# Patient Record
Sex: Female | Born: 1950
Health system: Southern US, Community
[De-identification: ages and names within clinical notes are randomized; demographics above are authoritative.]

## PROBLEM LIST (undated history)

## (undated) DIAGNOSIS — R112 Nausea with vomiting, unspecified: Secondary | ICD-10-CM

## (undated) DIAGNOSIS — L309 Dermatitis, unspecified: Secondary | ICD-10-CM

## (undated) DIAGNOSIS — Z9889 Other specified postprocedural states: Secondary | ICD-10-CM

## (undated) DIAGNOSIS — R32 Unspecified urinary incontinence: Secondary | ICD-10-CM

## (undated) DIAGNOSIS — K635 Polyp of colon: Secondary | ICD-10-CM

## (undated) HISTORY — PX: ADENOIDECTOMY: SUR15

## (undated) HISTORY — DX: Unspecified urinary incontinence: R32

## (undated) HISTORY — PX: ABDOMINAL HYSTERECTOMY: SHX81

## (undated) HISTORY — DX: Dermatitis, unspecified: L30.9

## (undated) HISTORY — DX: Polyp of colon: K63.5

---

## 2004-12-29 ENCOUNTER — Ambulatory Visit: Payer: Self-pay | Admitting: Unknown Physician Specialty

## 2006-02-18 ENCOUNTER — Ambulatory Visit: Payer: Self-pay | Admitting: Unknown Physician Specialty

## 2007-03-17 ENCOUNTER — Ambulatory Visit: Payer: Self-pay | Admitting: Unknown Physician Specialty

## 2008-05-08 ENCOUNTER — Ambulatory Visit: Payer: Self-pay | Admitting: Unknown Physician Specialty

## 2008-05-21 ENCOUNTER — Ambulatory Visit: Payer: Self-pay | Admitting: Unknown Physician Specialty

## 2008-06-28 ENCOUNTER — Ambulatory Visit: Payer: Self-pay | Admitting: General Surgery

## 2008-06-28 HISTORY — PX: COLONOSCOPY: SHX174

## 2009-05-27 ENCOUNTER — Ambulatory Visit: Payer: Self-pay | Admitting: Unknown Physician Specialty

## 2010-06-18 ENCOUNTER — Ambulatory Visit: Payer: Self-pay | Admitting: Unknown Physician Specialty

## 2011-07-17 ENCOUNTER — Ambulatory Visit: Payer: Self-pay | Admitting: Unknown Physician Specialty

## 2014-11-07 ENCOUNTER — Ambulatory Visit: Payer: Self-pay | Admitting: Unknown Physician Specialty

## 2016-09-15 ENCOUNTER — Ambulatory Visit (INDEPENDENT_AMBULATORY_CARE_PROVIDER_SITE_OTHER): Payer: Medicare Other | Admitting: Internal Medicine

## 2016-09-15 ENCOUNTER — Encounter: Payer: Self-pay | Admitting: Internal Medicine

## 2016-09-15 DIAGNOSIS — M810 Age-related osteoporosis without current pathological fracture: Secondary | ICD-10-CM | POA: Diagnosis not present

## 2016-09-15 DIAGNOSIS — Z86010 Personal history of colonic polyps: Secondary | ICD-10-CM | POA: Diagnosis not present

## 2016-09-15 DIAGNOSIS — Z8742 Personal history of other diseases of the female genital tract: Secondary | ICD-10-CM

## 2016-09-15 DIAGNOSIS — Z87898 Personal history of other specified conditions: Secondary | ICD-10-CM

## 2016-09-15 NOTE — Progress Notes (Signed)
Pre visit review using our clinic review tool, if applicable. No additional management support is needed unless otherwise documented below in the visit note. 

## 2016-09-15 NOTE — Progress Notes (Signed)
Patient ID: Toni Gardner, female   DOB: January 17, 1951, 65 y.o.   MRN: [id number]   Subjective:    Patient ID: Toni Gardner, female    DOB: September 25, 1951, 65 y.o.   MRN: [id number]  HPI  Patient here to establish care.  Has previously been seeing Dr Vernie Ammons and Dr Gaylan Gerold.  She has a history of osteoporosis.  Takes vitamin D.  Previously on fosamax.  She is s/p hysterectomy with BSO.  Had a history of abnormal pap smear.  Since her surgery, all normal paps.  Had bladder surgery 15 years ago.  Was not emptying her bladder.  This has improved.  She tries to stay active.  No chest pain.  No sob.  No acid reflux.  No abdominal pain or cramping.  Bowels stable.  Had colonoscopy in her early 48s.  Had another colonoscopy approximately 58.  No polyps.  Performed by Dr Bary Castilla.  Recommended f/u in 10 years.  Was seen at [facility] for her gyn care.  Two years ago had left hip pain.  Saw ortho.  Does exercise.  Is better.  Overall she feels she is doing well.     Past Medical History:  Diagnosis Date  . Colon polyp   . Urine incontinence    Past Surgical History:  Procedure Laterality Date  . ABDOMINAL HYSTERECTOMY    . ADENOIDECTOMY     Family History  Problem Relation Age of Onset  . Heart disease Father   . Cancer Maternal Aunt     breast  . Cancer Maternal Uncle     colon   Social History   Social History  . Marital status: Married    Spouse name: N/A  . Number of children: N/A  . Years of education: N/A   Social History Main Topics  . Smoking status: Never Smoker  . Smokeless tobacco: Never Used  . Alcohol use Yes  . Drug use: Unknown  . Sexual activity: Not Asked   Other Topics Concern  . None   Social History Narrative  . None    Outpatient Encounter Prescriptions as of 09/15/2016  Medication Sig  . ALPRAZolam (XANAX) 0.25 MG tablet   . cholecalciferol (VITAMIN D) 1000 units tablet Take 1,000 Units by mouth daily.   No facility-administered encounter medications on file as  of 09/15/2016.     Review of Systems  Constitutional: Negative for appetite change and unexpected weight change.  HENT: Negative for congestion and sinus pressure.   Respiratory: Negative for cough, chest tightness and shortness of breath.   Cardiovascular: Negative for chest pain, palpitations and leg swelling.  Gastrointestinal: Negative for abdominal pain, diarrhea, nausea and vomiting.  Genitourinary: Negative for difficulty urinating and dysuria.  Musculoskeletal: Negative for joint swelling.       Left hip pain better.  Does exercise.    Skin: Negative for color change and rash.  Neurological: Negative for dizziness, light-headedness and headaches.  Psychiatric/Behavioral: Negative for agitation and dysphoric mood.       Objective:    Physical Exam  Constitutional: She appears well-developed and well-nourished. No distress.  HENT:  Nose: Nose normal.  Mouth/Throat: Oropharynx is clear and moist.  Neck: Neck supple. No thyromegaly present.  Cardiovascular: Normal rate and regular rhythm.   Pulmonary/Chest: Breath sounds normal. No respiratory distress. She has no wheezes.  Abdominal: Soft. Bowel sounds are normal. There is no tenderness.  Musculoskeletal: She exhibits no edema or tenderness.  Lymphadenopathy:    She has no  cervical adenopathy.  Skin: No rash noted. No erythema.  Psychiatric: She has a normal mood and affect. Her behavior is normal.    BP 122/70   Pulse 91   Temp 98.1 F (36.7 C) (Oral)   Ht 4\' 11"  (1.499 m)   Wt 106 lb 3.2 oz (48.2 kg)   SpO2 96%   BMI 21.45 kg/m  Wt Readings from Last 3 Encounters:  09/15/16 106 lb 3.2 oz (48.2 kg)       Assessment & Plan:   Problem List Items Addressed This Visit    History of abnormal cervical Pap smear    Previously followed at [facility].  S/p hysterectomy.  All normal paps since.  Follow.       History of colonic polyps    Had last colonoscopy approximately age 82.  Performed by Dr Bary Castilla.  No  polyps.  Recommended f/u colonoscopy in 10 years.        Osteoporosis    Previously on fosamax.  Off now.  Continue vitamin D supplements.  Follow.        Relevant Medications   cholecalciferol (VITAMIN D) 1000 units tablet    Other Visit Diagnoses   None.      Einar Pheasant, MD

## 2016-09-20 ENCOUNTER — Encounter: Payer: Self-pay | Admitting: Internal Medicine

## 2016-09-20 DIAGNOSIS — M810 Age-related osteoporosis without current pathological fracture: Secondary | ICD-10-CM | POA: Insufficient documentation

## 2016-09-20 DIAGNOSIS — Z86010 Personal history of colonic polyps: Secondary | ICD-10-CM | POA: Insufficient documentation

## 2016-09-20 DIAGNOSIS — Z8742 Personal history of other diseases of the female genital tract: Secondary | ICD-10-CM | POA: Insufficient documentation

## 2016-09-20 NOTE — Assessment & Plan Note (Signed)
Previously followed at [facility].  S/p hysterectomy.  All normal paps since.  Follow.

## 2016-09-20 NOTE — Assessment & Plan Note (Signed)
Previously on fosamax.  Off now.  Continue vitamin D supplements.  Follow.

## 2016-09-20 NOTE — Assessment & Plan Note (Signed)
Had last colonoscopy approximately age 65.  Performed by Dr Bary Castilla.  No polyps.  Recommended f/u colonoscopy in 10 years.

## 2016-12-11 ENCOUNTER — Ambulatory Visit (INDEPENDENT_AMBULATORY_CARE_PROVIDER_SITE_OTHER): Payer: Medicare Other | Admitting: Internal Medicine

## 2016-12-11 ENCOUNTER — Encounter: Payer: Self-pay | Admitting: Internal Medicine

## 2016-12-11 VITALS — BP 116/62 | HR 94 | Temp 98.1°F | Ht 59.0 in | Wt 106.6 lb

## 2016-12-11 DIAGNOSIS — Z86010 Personal history of colonic polyps: Secondary | ICD-10-CM | POA: Diagnosis not present

## 2016-12-11 DIAGNOSIS — Z1211 Encounter for screening for malignant neoplasm of colon: Secondary | ICD-10-CM | POA: Insufficient documentation

## 2016-12-11 DIAGNOSIS — M810 Age-related osteoporosis without current pathological fracture: Secondary | ICD-10-CM | POA: Diagnosis not present

## 2016-12-11 DIAGNOSIS — Z87898 Personal history of other specified conditions: Secondary | ICD-10-CM

## 2016-12-11 DIAGNOSIS — Z1239 Encounter for other screening for malignant neoplasm of breast: Secondary | ICD-10-CM

## 2016-12-11 DIAGNOSIS — Z8742 Personal history of other diseases of the female genital tract: Secondary | ICD-10-CM

## 2016-12-11 DIAGNOSIS — Z1231 Encounter for screening mammogram for malignant neoplasm of breast: Secondary | ICD-10-CM

## 2016-12-11 DIAGNOSIS — Z13220 Encounter for screening for lipoid disorders: Secondary | ICD-10-CM

## 2016-12-11 DIAGNOSIS — Z0000 Encounter for general adult medical examination without abnormal findings: Secondary | ICD-10-CM | POA: Diagnosis not present

## 2016-12-11 MED ORDER — ALPRAZOLAM 0.25 MG PO TABS: ORAL_TABLET | ORAL | 1 refills | Status: DC

## 2016-12-11 NOTE — Progress Notes (Signed)
Pre visit review using our clinic review tool, if applicable. No additional management support is needed unless otherwise documented below in the visit note. 

## 2016-12-11 NOTE — Assessment & Plan Note (Addendum)
Physical today 12/11/16.  PAP 10/22/15 negative.  States had colonoscopy age 66.  Recommended f/u in 10 years per pt.  Schedule mammogram.

## 2016-12-11 NOTE — Progress Notes (Signed)
Patient ID: Toni Gardner, female   DOB: 1951-10-29, 66 y.o.   MRN: [id number]   Subjective:    Patient ID: Toni Gardner, female    DOB: May 29, 1951, 66 y.o.   MRN: [id number]  HPI  Patient here for her physical exam.  She is doing well.  States active.  No chest pain.  No sob.  Was being followed at [facility].  PAP 10/22/15 - negative.  Due mammogram.  No abdominal pain or cramping.  Bowels stable.     Past Medical History:  Diagnosis Date  . Colon polyp   . Urine incontinence    Past Surgical History:  Procedure Laterality Date  . ABDOMINAL HYSTERECTOMY    . ADENOIDECTOMY     Family History  Problem Relation Age of Onset  . Heart disease Father   . Cancer Maternal Aunt     breast  . Cancer Maternal Uncle     colon   Social History   Social History  . Marital status: Married    Spouse name: N/A  . Number of children: N/A  . Years of education: N/A   Social History Main Topics  . Smoking status: Never Smoker  . Smokeless tobacco: Never Used  . Alcohol use Yes  . Drug use: Unknown  . Sexual activity: Not Asked   Other Topics Concern  . None   Social History Narrative  . None    Outpatient Encounter Prescriptions as of 12/11/2016  Medication Sig  . ALPRAZolam (XANAX) 0.25 MG tablet 1/2 tablet q day prn  . cholecalciferol (VITAMIN D) 1000 units tablet Take 1,000 Units by mouth daily.  . [DISCONTINUED] ALPRAZolam (XANAX) 0.25 MG tablet    No facility-administered encounter medications on file as of 12/11/2016.     Review of Systems  Constitutional: Negative for appetite change and unexpected weight change.  HENT: Negative for congestion and sinus pressure.   Eyes: Negative for pain and visual disturbance.  Respiratory: Negative for cough, chest tightness and shortness of breath.   Cardiovascular: Negative for chest pain, palpitations and leg swelling.  Gastrointestinal: Negative for abdominal pain, diarrhea, nausea and vomiting.  Genitourinary: Negative for  difficulty urinating and dysuria.  Musculoskeletal: Negative for back pain and joint swelling.  Skin: Negative for color change and rash.  Neurological: Negative for dizziness, light-headedness and headaches.  Hematological: Negative for adenopathy. Does not bruise/bleed easily.  Psychiatric/Behavioral: Negative for agitation and dysphoric mood.       Objective:    Physical Exam  Constitutional: She is oriented to person, place, and time. She appears well-developed and well-nourished. No distress.  HENT:  Nose: Nose normal.  Mouth/Throat: Oropharynx is clear and moist.  Eyes: Right eye exhibits no discharge. Left eye exhibits no discharge. No scleral icterus.  Neck: Neck supple. No thyromegaly present.  Cardiovascular: Normal rate and regular rhythm.   Pulmonary/Chest: Breath sounds normal. No accessory muscle usage. No tachypnea. No respiratory distress. She has no decreased breath sounds. She has no wheezes. She has no rhonchi. Right breast exhibits no inverted nipple, no mass, no nipple discharge and no tenderness (no axillary adenopathy). Left breast exhibits no inverted nipple, no mass, no nipple discharge and no tenderness (no axilarry adenopathy).  Abdominal: Soft. Bowel sounds are normal. There is no tenderness.  Musculoskeletal: She exhibits no edema or tenderness.  Lymphadenopathy:    She has no cervical adenopathy.  Neurological: She is alert and oriented to person, place, and time.  Skin: Skin is warm. No rash  noted. No erythema.  Psychiatric: She has a normal mood and affect. Her behavior is normal.    BP 116/62   Pulse 94   Temp 98.1 F (36.7 C) (Oral)   Ht 4\' 11"  (1.499 m)   Wt 106 lb 9.6 oz (48.4 kg)   SpO2 94%   BMI 21.53 kg/m  Wt Readings from Last 3 Encounters:  12/11/16 106 lb 9.6 oz (48.4 kg)  09/15/16 106 lb 3.2 oz (48.2 kg)       Assessment & Plan:   Problem List Items Addressed This Visit    Health care maintenance    Physical today 12/11/16.  PAP  10/22/15 negative.  States had colonoscopy age 1.  Recommended f/u in 10 years per pt.  Schedule mammogram.       History of abnormal cervical Pap smear    PAP - 10/22/15 - negative.  Has been followed by [facility].        History of colonic polyps    Had colonoscopy approximately age 35.  Performed by Dr Bary Castilla.  No polyps.  States recommended f/u colonoscopy - 10 years.        Osteoporosis    Previously on fosamax.  Discussed f/u bone density.  She is agreeable, but does not want to schedule at this time.  Taking vitamin D supplements.  Recheck vitamin D level.        Relevant Orders   CBC with Differential/Platelet   Comprehensive metabolic panel   TSH   VITAMIN D 25 Hydroxy (Vit-D Deficiency, Fractures)    Other Visit Diagnoses    Routine general medical examination at a health care facility    -  Primary   Breast cancer screening       Relevant Orders   MM DIGITAL SCREENING BILATERAL   Screening cholesterol level       Relevant Orders   Lipid panel       Einar Pheasant, MD

## 2016-12-13 ENCOUNTER — Encounter: Payer: Self-pay | Admitting: Internal Medicine

## 2016-12-13 NOTE — Assessment & Plan Note (Signed)
Had colonoscopy approximately age 66.  Performed by Dr Bary Castilla.  No polyps.  States recommended f/u colonoscopy - 10 years.

## 2016-12-13 NOTE — Assessment & Plan Note (Signed)
Previously on fosamax.  Discussed f/u bone density.  She is agreeable, but does not want to schedule at this time.  Taking vitamin D supplements.  Recheck vitamin D level.

## 2016-12-13 NOTE — Assessment & Plan Note (Signed)
PAP - 10/22/15 - negative.  Has been followed by [facility].

## 2017-01-01 ENCOUNTER — Other Ambulatory Visit (INDEPENDENT_AMBULATORY_CARE_PROVIDER_SITE_OTHER): Payer: Medicare Other

## 2017-01-01 DIAGNOSIS — Z13220 Encounter for screening for lipoid disorders: Secondary | ICD-10-CM | POA: Diagnosis not present

## 2017-01-01 DIAGNOSIS — M810 Age-related osteoporosis without current pathological fracture: Secondary | ICD-10-CM

## 2017-01-01 LAB — COMPREHENSIVE METABOLIC PANEL
ALT: 20 U/L (ref 0–35)
AST: 16 U/L (ref 0–37)
Albumin: 4.3 g/dL (ref 3.5–5.2)
Alkaline Phosphatase: 71 U/L (ref 39–117)
BUN: 19 mg/dL (ref 6–23)
CHLORIDE: 106 meq/L (ref 96–112)
CO2: 31 meq/L (ref 19–32)
CREATININE: 0.73 mg/dL (ref 0.40–1.20)
Calcium: 9.7 mg/dL (ref 8.4–10.5)
GFR: 84.43 mL/min (ref >60.00)
Glucose, Bld: 95 mg/dL (ref 70–99)
Potassium: 4.7 mEq/L (ref 3.5–5.1)
SODIUM: 140 meq/L (ref 135–145)
Total Bilirubin: 0.4 mg/dL (ref 0.2–1.2)
Total Protein: 6.9 g/dL (ref 6.0–8.3)

## 2017-01-01 LAB — CBC WITH DIFFERENTIAL/PLATELET
BASOS PCT: 0.4 % (ref 0.0–3.0)
Basophils Absolute: 0 10*3/uL (ref 0.0–0.1)
EOS ABS: 0.2 10*3/uL (ref 0.0–0.7)
EOS PCT: 3 % (ref 0.0–5.0)
HCT: 42.7 % (ref 36.0–46.0)
HEMOGLOBIN: 14.5 g/dL (ref 12.0–15.0)
LYMPHS PCT: 30.6 % (ref 12.0–46.0)
Lymphs Abs: 2.5 10*3/uL (ref 0.7–4.0)
MCHC: 33.9 g/dL (ref 30.0–36.0)
MCV: 92.5 fl (ref 78.0–100.0)
Monocytes Absolute: 0.6 10*3/uL (ref 0.1–1.0)
Monocytes Relative: 7.3 % (ref 3.0–12.0)
Neutro Abs: 4.8 10*3/uL (ref 1.4–7.7)
Neutrophils Relative %: 58.7 % (ref 43.0–77.0)
Platelets: 214 10*3/uL (ref 150.0–400.0)
RBC: 4.62 Mil/uL (ref 3.87–5.11)
RDW: 14.3 % (ref 11.5–15.5)
WBC: 8.2 10*3/uL (ref 4.0–10.5)

## 2017-01-01 LAB — LIPID PANEL
CHOL/HDL RATIO: 2
Cholesterol: 198 mg/dL (ref 0–200)
HDL: 84.3 mg/dL (ref >39.00)
LDL CALC: 99 mg/dL (ref 0–99)
NonHDL: 113.32
TRIGLYCERIDES: 71 mg/dL (ref 0.0–149.0)
VLDL: 14.2 mg/dL (ref 0.0–40.0)

## 2017-01-01 LAB — VITAMIN D 25 HYDROXY (VIT D DEFICIENCY, FRACTURES): VITD: 32.68 ng/mL (ref 30.00–100.00)

## 2017-01-01 LAB — TSH: TSH: 1.38 u[IU]/mL (ref 0.35–4.50)

## 2017-01-04 ENCOUNTER — Encounter: Payer: Self-pay | Admitting: Internal Medicine

## 2017-01-08 ENCOUNTER — Ambulatory Visit
Admission: RE | Admit: 2017-01-08 | Discharge: 2017-01-08 | Disposition: A | Discharge status: Home / Self Care | Payer: Medicare Other | Source: Ambulatory Visit | Attending: Internal Medicine | Admitting: Internal Medicine

## 2017-01-08 DIAGNOSIS — Z1231 Encounter for screening mammogram for malignant neoplasm of breast: Secondary | ICD-10-CM | POA: Insufficient documentation

## 2017-01-08 DIAGNOSIS — Z1239 Encounter for other screening for malignant neoplasm of breast: Secondary | ICD-10-CM

## 2017-06-17 ENCOUNTER — Ambulatory Visit: Payer: Medicare Other | Admitting: Internal Medicine

## 2017-06-24 ENCOUNTER — Ambulatory Visit: Payer: Medicare Other | Admitting: Internal Medicine

## 2017-08-05 ENCOUNTER — Ambulatory Visit (INDEPENDENT_AMBULATORY_CARE_PROVIDER_SITE_OTHER): Payer: Medicare Other | Admitting: Internal Medicine

## 2017-08-05 ENCOUNTER — Encounter: Payer: Self-pay | Admitting: Internal Medicine

## 2017-08-05 VITALS — BP 120/70 | HR 84 | Temp 98.5°F | Ht 59.0 in | Wt 106.0 lb

## 2017-08-05 DIAGNOSIS — Z8742 Personal history of other diseases of the female genital tract: Secondary | ICD-10-CM

## 2017-08-05 DIAGNOSIS — M810 Age-related osteoporosis without current pathological fracture: Secondary | ICD-10-CM

## 2017-08-05 DIAGNOSIS — Z23 Encounter for immunization: Secondary | ICD-10-CM

## 2017-08-05 DIAGNOSIS — Z87898 Personal history of other specified conditions: Secondary | ICD-10-CM

## 2017-08-05 DIAGNOSIS — Z86010 Personal history of colonic polyps: Secondary | ICD-10-CM

## 2017-08-05 MED ORDER — ALPRAZOLAM 0.25 MG PO TABS: ORAL_TABLET | ORAL | 1 refills | Status: DC

## 2017-08-05 MED ORDER — TETANUS-DIPHTH-ACELL PERTUSSIS 5-2.5-18.5 LF-MCG/0.5 IM SUSP: 0.5000 mL | Freq: Once | INTRAMUSCULAR | 0 refills | Status: AC

## 2017-08-05 NOTE — Progress Notes (Signed)
Patient ID: Toni Gardner, female   DOB: Sep 02, 1951, 66 y.o.   MRN: [id number]   Subjective:    Patient ID: Toni Gardner, female    DOB: 1951/11/24, 66 y.o.   MRN: [id number]  HPI  Patient here for a scheduled follow up.  States she is doing well.  Stays active.  No chest pain.  No sob.  No acid reflux.  No abdominal pain.  Bowels moving.  Discussed immunizations.     Past Medical History:  Diagnosis Date  . Colon polyp   . Urine incontinence    Past Surgical History:  Procedure Laterality Date  . ABDOMINAL HYSTERECTOMY    . ADENOIDECTOMY     Family History  Problem Relation Age of Onset  . Heart disease Father   . Cancer Maternal Aunt        breast  . Breast cancer Maternal Aunt   . Cancer Maternal Uncle        colon   Social History   Social History  . Marital status: Married    Spouse name: N/A  . Number of children: N/A  . Years of education: N/A   Social History Main Topics  . Smoking status: Never Smoker  . Smokeless tobacco: Never Used  . Alcohol use Yes  . Drug use: Unknown  . Sexual activity: Not Asked   Other Topics Concern  . None   Social History Narrative  . None    Outpatient Encounter Prescriptions as of 08/05/2017  Medication Sig  . ALPRAZolam (XANAX) 0.25 MG tablet 1/2 tablet q day prn  . cholecalciferol (VITAMIN D) 1000 units tablet Take 1,000 Units by mouth daily.  . [DISCONTINUED] ALPRAZolam (XANAX) 0.25 MG tablet 1/2 tablet q day prn  . [EXPIRED] Tdap (BOOSTRIX) 5-2.5-18.5 LF-MCG/0.5 injection Inject 0.5 mLs into the muscle once.   No facility-administered encounter medications on file as of 08/05/2017.     Review of Systems  Constitutional: Negative for appetite change and unexpected weight change.  HENT: Negative for congestion and sinus pressure.   Respiratory: Negative for cough, chest tightness and shortness of breath.   Cardiovascular: Negative for chest pain, palpitations and leg swelling.  Gastrointestinal: Negative for  abdominal pain, diarrhea, nausea and vomiting.  Genitourinary: Negative for difficulty urinating and dysuria.  Musculoskeletal: Negative for back pain and joint swelling.  Skin: Negative for color change and rash.  Neurological: Negative for dizziness, light-headedness and headaches.  Psychiatric/Behavioral: Negative for agitation and dysphoric mood.       Objective:    Physical Exam  Constitutional: She appears well-developed and well-nourished. No distress.  HENT:  Nose: Nose normal.  Mouth/Throat: Oropharynx is clear and moist.  Neck: Neck supple. No thyromegaly present.  Cardiovascular: Normal rate and regular rhythm.   Pulmonary/Chest: Breath sounds normal. No respiratory distress. She has no wheezes.  Abdominal: Soft. Bowel sounds are normal. There is no tenderness.  Musculoskeletal: She exhibits no edema or tenderness.  Lymphadenopathy:    She has no cervical adenopathy.  Skin: No rash noted. No erythema.  Psychiatric: She has a normal mood and affect. Her behavior is normal.    BP 120/70 (BP Location: Left Arm, Patient Position: Sitting, Cuff Size: Normal)   Pulse 84   Temp 98.5 F (36.9 C) (Oral)   Ht 4\' 11"  (1.499 m)   Wt 106 lb (48.1 kg)   SpO2 95%   BMI 21.41 kg/m  Wt Readings from Last 3 Encounters:  08/05/17 106 lb (48.1 kg)  12/11/16 106 lb 9.6 oz (48.4 kg)  09/15/16 106 lb 3.2 oz (48.2 kg)     Lab Results  Component Value Date   WBC 8.2 01/01/2017   HGB 14.5 01/01/2017   HCT 42.7 01/01/2017   PLT 214.0 01/01/2017   GLUCOSE 95 01/01/2017   CHOL 198 01/01/2017   TRIG 71.0 01/01/2017   HDL 84.30 01/01/2017   LDLCALC 99 01/01/2017   ALT 20 01/01/2017   AST 16 01/01/2017   NA 140 01/01/2017   K 4.7 01/01/2017   CL 106 01/01/2017   CREATININE 0.73 01/01/2017   BUN 19 01/01/2017   CO2 31 01/01/2017   TSH 1.38 01/01/2017    Mm Screening Breast Tomo Bilateral  Result Date: 01/11/2017 CLINICAL DATA:  Screening. EXAM: 2D DIGITAL SCREENING  BILATERAL MAMMOGRAM WITH CAD AND ADJUNCT TOMO COMPARISON:  Previous exam(s). ACR Breast Density Category c: The breast tissue is heterogeneously dense, which may obscure small masses. FINDINGS: There are no findings suspicious for malignancy. Images were processed with CAD. IMPRESSION: No mammographic evidence of malignancy. A result letter of this screening mammogram will be mailed directly to the patient. RECOMMENDATION: Screening mammogram in one year. (Code:SM-B-01Y) BI-RADS CATEGORY  1: Negative. Electronically Signed   By: Ammie Ferrier M.D.   On: 01/11/2017 10:22       Assessment & Plan:   Problem List Items Addressed This Visit    History of abnormal cervical Pap smear    Has been followed by [facility].        History of colonic polyps    Need to confirm when due colonoscopy.  Stated needed f/u 10 years after last.        Osteoporosis    Previously on fosamax.  Have discussed f/u bone density.  Will notify me when wants to schedule.         Other Visit Diagnoses    Need for tetanus, diphtheria, and acellular pertussis (Tdap) vaccine    -  Primary   Need for pneumococcal vaccination       Relevant Orders   Pneumococcal conjugate vaccine 13-valent       Einar Pheasant, MD

## 2017-08-05 NOTE — Progress Notes (Signed)
Pre-visit discussion using our clinic review tool. No additional management support is needed unless otherwise documented below in the visit note.  

## 2017-08-08 ENCOUNTER — Encounter: Payer: Self-pay | Admitting: Internal Medicine

## 2017-08-08 NOTE — Assessment & Plan Note (Signed)
Need to confirm when due colonoscopy.  Stated needed f/u 10 years after last.

## 2017-08-08 NOTE — Assessment & Plan Note (Signed)
Has been followed by [facility].

## 2017-08-08 NOTE — Assessment & Plan Note (Signed)
Previously on fosamax.  Have discussed f/u bone density.  Will notify me when wants to schedule.

## 2017-08-10 NOTE — Addendum Note (Signed)
Addended by: Francella Solian on: 08/10/2017 11:26 AM   Modules accepted: Orders

## 2017-09-10 ENCOUNTER — Telehealth: Payer: Self-pay | Admitting: *Deleted

## 2017-09-10 NOTE — Telephone Encounter (Signed)
Pt has a small rough patch under her left breast,the area has no pain.  Pt requested to see Dr.Scott only , please give a time and date to place pt.  Pt contact [phone]

## 2017-09-10 NOTE — Telephone Encounter (Signed)
I can see her on 09/14/17 at 8:00.

## 2017-09-10 NOTE — Telephone Encounter (Signed)
App made patient informed

## 2017-09-10 NOTE — Telephone Encounter (Signed)
Called patient states she has small raised area around the size of a dime that is light brown in color and slightly raised. No itching or d/c from area or redness. It is under her left breast x 2 weeks. She has not tried any otc meds. Has not gotten any larger or changed in symptoms.

## 2017-09-14 ENCOUNTER — Ambulatory Visit (INDEPENDENT_AMBULATORY_CARE_PROVIDER_SITE_OTHER): Payer: Medicare Other | Admitting: Internal Medicine

## 2017-09-14 ENCOUNTER — Encounter: Payer: Self-pay | Admitting: Internal Medicine

## 2017-09-14 VITALS — BP 110/60 | HR 96 | Temp 98.8°F | Resp 14 | Ht 59.0 in | Wt 107.0 lb

## 2017-09-14 DIAGNOSIS — L989 Disorder of the skin and subcutaneous tissue, unspecified: Secondary | ICD-10-CM | POA: Diagnosis not present

## 2017-09-14 DIAGNOSIS — J069 Acute upper respiratory infection, unspecified: Secondary | ICD-10-CM | POA: Diagnosis not present

## 2017-09-14 NOTE — Patient Instructions (Signed)
Robitussin DM twice a day as needed for cough.  

## 2017-09-14 NOTE — Progress Notes (Signed)
Patient ID: Toni Gardner, female   DOB: [birth year], 66 y.o.   MRN: [id number]   Subjective:    Patient ID: Toni Gardner, female    DOB: Apr 27, 1951, 66 y.o.   MRN: [id number]  HPI  Patient here for a scheduled follow up.  She reports that starting last week (5 days ago), she developed aching and fever with sore throat.  No headache.  Some sinus symptoms.  This is better.  No fever now.  No headache.  No sore throat.  No significant congestion or sinus pressure now.  Minimal cough.  No sob.  No wheezing.  Eating and drinking.  No nausea or vomiting.  No diarrhea.  Feels better.  She had called because she was concerned regarding a place beneath her left breast.  Just felt a rough area and was not sure the etiology.     Past Medical History:  Diagnosis Date  . Colon polyp   . Urine incontinence    Past Surgical History:  Procedure Laterality Date  . ABDOMINAL HYSTERECTOMY    . ADENOIDECTOMY     Family History  Problem Relation Age of Onset  . Heart disease Father   . Cancer Maternal Aunt        breast  . Breast cancer Maternal Aunt   . Cancer Maternal Uncle        colon   Social History   Social History  . Marital status: Married    Spouse name: N/A  . Number of children: N/A  . Years of education: N/A   Social History Main Topics  . Smoking status: Never Smoker  . Smokeless tobacco: Never Used  . Alcohol use Yes  . Drug use: Unknown  . Sexual activity: Not Asked   Other Topics Concern  . None   Social History Narrative  . None    Outpatient Encounter Prescriptions as of 09/14/2017  Medication Sig  . ALPRAZolam (XANAX) 0.25 MG tablet 1/2 tablet q day prn  . cholecalciferol (VITAMIN D) 1000 units tablet Take 1,000 Units by mouth daily.   No facility-administered encounter medications on file as of 09/14/2017.     Review of Systems  Constitutional: Negative for appetite change and unexpected weight change.  HENT: Negative for sinus pressure.        Congestion and  sore throat better.    Respiratory: Negative for chest tightness and shortness of breath.        Minimal cough  Cardiovascular: Negative for chest pain and leg swelling.  Gastrointestinal: Negative for diarrhea, nausea and vomiting.  Skin: Negative for color change and rash.       Skin lesion beneath left breast  Neurological: Negative for dizziness and headaches.       Objective:    Physical Exam  Constitutional: She appears well-developed and well-nourished. No distress.  HENT:  Nose: Nose normal.  Mouth/Throat: Oropharynx is clear and moist.  TMs without erythema.  No sinus tenderness to palpation.   Neck: Neck supple.  Cardiovascular: Normal rate and regular rhythm.   Pulmonary/Chest: Breath sounds normal. No respiratory distress. She has no wheezes.  Abdominal: Soft. Bowel sounds are normal. There is no tenderness.  Lymphadenopathy:    She has no cervical adenopathy.  Skin: No rash noted. No erythema.  Raised lesion - appears to be c/w keratosis.    Psychiatric: She has a normal mood and affect. Her behavior is normal.    BP 110/60 (BP Location: Left Arm, Patient Position: Sitting,  Cuff Size: Normal)   Pulse 96   Temp 98.8 F (37.1 C) (Oral)   Resp 14   Ht 4\' 11"  (1.499 m)   Wt 107 lb (48.5 kg)   SpO2 97%   BMI 21.61 kg/m  Wt Readings from Last 3 Encounters:  09/14/17 107 lb (48.5 kg)  08/05/17 106 lb (48.1 kg)  12/11/16 106 lb 9.6 oz (48.4 kg)     Lab Results  Component Value Date   WBC 8.2 01/01/2017   HGB 14.5 01/01/2017   HCT 42.7 01/01/2017   PLT 214.0 01/01/2017   GLUCOSE 95 01/01/2017   CHOL 198 01/01/2017   TRIG 71.0 01/01/2017   HDL 84.30 01/01/2017   LDLCALC 99 01/01/2017   ALT 20 01/01/2017   AST 16 01/01/2017   NA 140 01/01/2017   K 4.7 01/01/2017   CL 106 01/01/2017   CREATININE 0.73 01/01/2017   BUN 19 01/01/2017   CO2 31 01/01/2017   TSH 1.38 01/01/2017    Mm Screening Breast Tomo Bilateral  Result Date: 01/11/2017 CLINICAL  DATA:  Screening. EXAM: 2D DIGITAL SCREENING BILATERAL MAMMOGRAM WITH CAD AND ADJUNCT TOMO COMPARISON:  Previous exam(s). ACR Breast Density Category c: The breast tissue is heterogeneously dense, which may obscure small masses. FINDINGS: There are no findings suspicious for malignancy. Images were processed with CAD. IMPRESSION: No mammographic evidence of malignancy. A result letter of this screening mammogram will be mailed directly to the patient. RECOMMENDATION: Screening mammogram in one year. (Code:SM-B-01Y) BI-RADS CATEGORY  1: Negative. Electronically Signed   By: Ammie Ferrier M.D.   On: 01/11/2017 10:22       Assessment & Plan:   Problem List Items Addressed This Visit    None    Visit Diagnoses    Skin lesion    -  Primary   Under left breast.  Appears to be c/w keratosis.  Will have dermatology evaluate.  also skin check.  refer to dermatology.    Relevant Orders   Ambulatory referral to Dermatology   Upper respiratory tract infection, unspecified type       Symptoms improved.  minimal cough.  robitussin DM as directed.  feels better.  notify me or be reevaluated if problems.         Einar Pheasant, MD

## 2017-09-16 ENCOUNTER — Telehealth: Payer: Self-pay | Admitting: *Deleted

## 2017-09-16 MED ORDER — AMOXICILLIN 875 MG PO TABS: 875.0000 mg | ORAL_TABLET | Freq: Two times a day (BID) | ORAL | 0 refills | Status: DC

## 2017-09-16 NOTE — Telephone Encounter (Signed)
Pt was seen in the office on Tuesday and advised to call the office with any changes. Pt has a fever for 2 days averaging around 101, pt questioned if Dr.Scott would like to start a antibiotic .  Pt contact   [phone]

## 2017-09-16 NOTE — Telephone Encounter (Signed)
Patient denies having any new symptoms. She says that the robitussin has helped her cough some but she now has a fever of 101 and has had it for 2 days. She says she is also having body aches. She was seen by you on 10/9. Please advise.

## 2017-09-16 NOTE — Telephone Encounter (Signed)
Spoke to pt.  She had symptoms over the last week.  Was a little better, now worse again.  Prefer not to come in due to weather.  With fever and cough.  Tolerates amoxicllin.  Sent in amoxicillin.  Instructed to take probiotic daily while on abx and for two weeks after complete abx.  Needs to be evaluated if symptoms change or worsen.  No flu exposure she is aware of.  Symptoms since last week.

## 2017-10-03 ENCOUNTER — Ambulatory Visit
Admission: EM | Admit: 2017-10-03 | Discharge: 2017-10-03 | Disposition: A | Discharge status: Home / Self Care | Payer: Medicare Other | Attending: Family Medicine | Admitting: Family Medicine

## 2017-10-03 DIAGNOSIS — J069 Acute upper respiratory infection, unspecified: Secondary | ICD-10-CM

## 2017-10-03 DIAGNOSIS — B9789 Other viral agents as the cause of diseases classified elsewhere: Secondary | ICD-10-CM

## 2017-10-03 DIAGNOSIS — R059 Cough, unspecified: Secondary | ICD-10-CM

## 2017-10-03 DIAGNOSIS — R05 Cough: Secondary | ICD-10-CM

## 2017-10-03 MED ORDER — HYDROCOD POLST-CPM POLST ER 10-8 MG/5ML PO SUER: 5.0000 mL | Freq: Two times a day (BID) | ORAL | 0 refills | Status: DC | PRN

## 2017-10-03 MED ORDER — PREDNISONE 20 MG PO TABS: 20.0000 mg | ORAL_TABLET | Freq: Every day | ORAL | 0 refills | Status: DC

## 2017-10-03 NOTE — ED Triage Notes (Signed)
Patient states that she started feeling sick on October 4 and was seen by Dr. Nicki Reaper and went back to see her on 09/14/2017 was treated with amoxicillin. Patient states that symptoms returned after finish of antibiotics. Patient states that she is starting to run fevers again, cough that will not allow her to sleep and and with productivity. Patient is concerned why she is still sick 3 weeks later.

## 2017-10-03 NOTE — ED Provider Notes (Signed)
[facility]    CSN: [id number] Arrival date & time: 10/03/17  0806     History   Chief Complaint Chief Complaint  Patient presents with  . Cough    HPI Toni Gardner is a 66 y.o. female.   The history is provided by the patient.  URI  Presenting symptoms: congestion and cough   Severity:  Moderate Onset quality:  Sudden Duration:  1 week Timing:  Intermittent Progression:  Unchanged Chronicity:  New Relieved by:  Prescription medications Ineffective treatments:  OTC medications Associated symptoms: no headaches and no sinus pain   Risk factors: not elderly, no chronic cardiac disease, no chronic kidney disease, no chronic respiratory disease, no diabetes mellitus, no immunosuppression, no recent illness, no recent travel and no sick contacts     Past Medical History:  Diagnosis Date  . Colon polyp   . Urine incontinence     Patient Active Problem List   Diagnosis Date Noted  . Health care maintenance 12/11/2016  . History of abnormal cervical Pap smear 09/20/2016  . History of colonic polyps 09/20/2016  . Osteoporosis 09/20/2016    Past Surgical History:  Procedure Laterality Date  . ABDOMINAL HYSTERECTOMY    . ADENOIDECTOMY      OB History    No data available       Home Medications    Prior to Admission medications   Medication Sig Start Date End Date Taking? Authorizing Provider  ALPRAZolam Duanne Moron) 0.25 MG tablet 1/2 tablet q day prn 08/05/17  Yes Einar Pheasant, MD  cholecalciferol (VITAMIN D) 1000 units tablet Take 1,000 Units by mouth daily.   Yes [provider]  amoxicillin (AMOXIL) 875 MG tablet Take 1 tablet (875 mg total) by mouth 2 (two) times daily. 09/16/17   Einar Pheasant, MD  chlorpheniramine-HYDROcodone [facility] PENNKINETIC ER) 10-8 MG/5ML SUER Take 5 mLs by mouth every 12 (twelve) hours as needed. 10/03/17   Norval Gable, MD  predniSONE (DELTASONE) 20 MG tablet Take 1 tablet (20 mg total) by mouth daily.  10/03/17   Norval Gable, MD    Family History Family History  Problem Relation Age of Onset  . Heart disease Father   . Cancer Maternal Aunt        breast  . Breast cancer Maternal Aunt   . Cancer Maternal Uncle        colon    Social History Social History  Substance Use Topics  . Smoking status: Never Smoker  . Smokeless tobacco: Never Used  . Alcohol use Yes     Allergies   Sulfa antibiotics   Review of Systems Review of Systems  HENT: Positive for congestion. Negative for sinus pain.   Respiratory: Positive for cough.   Neurological: Negative for headaches.     Physical Exam Triage Vital Signs ED Triage Vitals  Enc Vitals Group     BP 10/03/17 0826 113/67     Pulse Rate 10/03/17 0826 (!) 105     Resp 10/03/17 0826 18     Temp 10/03/17 0826 98.5 F (36.9 C)     Temp Source 10/03/17 0826 Oral     SpO2 10/03/17 0826 96 %     Weight 10/03/17 0824 106 lb (48.1 kg)     Height 10/03/17 0824 4\' 11"  (1.499 m)     Head Circumference --      Peak Flow --      Pain Score 10/03/17 0824 2     Pain Loc --  Pain Edu? --      Excl. in [place]? --    No data found.   Updated Vital Signs BP 113/67 (BP Location: Left Arm)   Pulse (!) 105   Temp 98.5 F (36.9 C) (Oral)   Resp 18   Ht 4\' 11"  (1.499 m)   Wt 106 lb (48.1 kg)   SpO2 96%   BMI 21.41 kg/m   Visual Acuity Right Eye Distance:   Left Eye Distance:   Bilateral Distance:    Right Eye Near:   Left Eye Near:    Bilateral Near:     Physical Exam  Constitutional: She appears well-developed and well-nourished. No distress.  HENT:  Head: Normocephalic and atraumatic.  Right Ear: Tympanic membrane, external ear and ear canal normal.  Left Ear: Tympanic membrane, external ear and ear canal normal.  Nose: No mucosal edema, rhinorrhea, nose lacerations, sinus tenderness, nasal deformity, septal deviation or nasal septal hematoma. No epistaxis.  No foreign bodies. Right sinus exhibits no maxillary sinus  tenderness and no frontal sinus tenderness. Left sinus exhibits no maxillary sinus tenderness and no frontal sinus tenderness.  Mouth/Throat: Uvula is midline, oropharynx is clear and moist and mucous membranes are normal. No oropharyngeal exudate.  Eyes: Pupils are equal, round, and reactive to light. Conjunctivae and EOM are normal. Right eye exhibits no discharge. Left eye exhibits no discharge. No scleral icterus.  Neck: Normal range of motion. Neck supple. No thyromegaly present.  Cardiovascular: Normal rate, regular rhythm and normal heart sounds.   Pulmonary/Chest: Effort normal and breath sounds normal. No respiratory distress. She has no wheezes. She has no rales.  Lymphadenopathy:    She has no cervical adenopathy.  Skin: She is not diaphoretic.  Nursing note and vitals reviewed.    UC Treatments / Results  Labs (all labs ordered are listed, but only abnormal results are displayed) Labs Reviewed - No data to display  EKG  EKG Interpretation None       Radiology No results found.  Procedures Procedures (including critical care time)  Medications Ordered in UC Medications - No data to display   Initial Impression / Assessment and Plan / UC Course  I have reviewed the triage vital signs and the nursing notes.  Pertinent labs & imaging results that were available during my care of the patient were reviewed by me and considered in my medical decision making (see chart for details).    Final Clinical Impressions(s) / UC Diagnoses   Final diagnoses:  Cough  Viral URI with cough    New Prescriptions Discharge Medication List as of 10/03/2017  8:41 AM    START taking these medications   Details  chlorpheniramine-HYDROcodone (TUSSIONEX PENNKINETIC ER) 10-8 MG/5ML SUER Take 5 mLs by mouth every 12 (twelve) hours as needed., Starting Sun 10/03/2017, Normal    predniSONE (DELTASONE) 20 MG tablet Take 1 tablet (20 mg total) by mouth daily., Starting Sun 10/03/2017,  Normal       1.diagnosis reviewed with patient 2. rx as per orders above; reviewed possible side effects, interactions, risks and benefits  3. Recommend supportive treatment with increased fluids, rest 4. Follow-up prn if symptoms worsen or don't improve Controlled Substance Prescriptions [place] Controlled Substance Registry consulted? Not Applicable   Norval Gable, MD 10/03/17 1049

## 2017-10-12 ENCOUNTER — Ambulatory Visit: Payer: Self-pay | Admitting: *Deleted

## 2017-10-12 NOTE — Telephone Encounter (Signed)
Pt complains of cough; previously treated by Dr Nicki Reaper and given Amoxicillan; initially felt better after taking but started feeling bad and coughing more; pt went to Urgent Care in [facility]; was given prednisone and cough medicine; last dose of prednisone on 10/10/17; Pt states that cough is gone now she "still doesn't feel good";  She also states that she took 2 tylenol this morning; Pt is requesting to see Dr Nicki Reaper; able to schedule pt with Dr Derrel Nip 10/13/17 at 1000  Reason for Disposition . [1] Continuous (nonstop) coughing interferes with work or school AND [2] no improvement using cough treatment per protocol  Protocols used: COUGH - ACUTE NON-PRODUCTIVE-A-AH

## 2017-10-13 ENCOUNTER — Ambulatory Visit: Payer: Medicare Other | Admitting: Internal Medicine

## 2017-10-13 ENCOUNTER — Encounter: Payer: Self-pay | Admitting: Internal Medicine

## 2017-10-13 DIAGNOSIS — J329 Chronic sinusitis, unspecified: Secondary | ICD-10-CM | POA: Diagnosis not present

## 2017-10-13 MED ORDER — PREDNISONE 10 MG PO TABS: ORAL_TABLET | ORAL | 0 refills | Status: DC

## 2017-10-13 MED ORDER — FLUTICASONE PROPIONATE 50 MCG/ACT NA SUSP: 2.0000 | Freq: Every day | NASAL | 6 refills | Status: DC

## 2017-10-13 MED ORDER — AMOXICILLIN-POT CLAVULANATE 875-125 MG PO TABS: 1.0000 | ORAL_TABLET | Freq: Two times a day (BID) | ORAL | 0 refills | Status: DC

## 2017-10-13 NOTE — Patient Instructions (Addendum)
You have no signs of bacterial sinusitis at this time.  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  Prednisone taper for the inflammation (6 days )    Add benadryl 25 mg at bedtime to manage the  post nasal drip, Flonase daily,   and Afrin nasal spray every 12 hours as needed for the congestion. (for 5 days max)  Gargle with salt water as needed for the sore throat.   If you develop T > 100.4,  Green/brown or blood streaked  nasal discharge,  Or facial/ear  pain,  Start the Augmentin  antibiotic  And let me know so I can make an ENT referral to have your sinuses evaluated    Please take a probiotic ( Align, Floraque or Culturelle), the generic version of one of these over the counter medications, or a PROBIOTIC BEVERAGE  (kombucha,  [place] ) Yogurt, or another dietary source) for a minimum of 3 weeks to prevent a serious antibiotic associated diarrhea  Called clostridium dificile colitis.  Taking a probiotic may also prevent vaginitis due to yeast infections and can be continued indefinitely if you feel that it improves your digestion or your elimination (bowels).Marland Kitchen

## 2017-10-13 NOTE — Progress Notes (Signed)
Subjective:  Patient ID: Toni Gardner, female    DOB: [birth year]  Age: 66 y.o. MRN: [id number]  CC: The encounter diagnosis was Recurrent sinusitis.  HPI [name] presents for persistnet URI symptoms accompanied by malaise  Treated  Dec 2017, then no visits for URI symptoms until Fall:  Sept 17 2018 treated with amoxicillin,  Resolved , then returned  Oct 9 ([place] treated with amoxicillin  Again .  Felt much better,  But after finishing antibiotics,  symptoms  returned with cough  And achy feeling  On  Oct 28 (ED)  With prednisone  and tussionex   Currently  Feeling achey,  Temp was 100.0 this morning,  Feels thick sinus drainage,  No rhinorrea,  Just PND that is thick  And hard to clear.  Finished prednisone on Sunday .  No facial or ear pain .  No dyspnea,  Not using tussionex   Does not use flonase on a consistent basis. Uses netty pott prn 1-2/day but not lately.     Outpatient Medications Prior to Visit  Medication Sig Dispense Refill  . ALPRAZolam (XANAX) 0.25 MG tablet 1/2 tablet q day prn 30 tablet 1  . chlorpheniramine-HYDROcodone (TUSSIONEX PENNKINETIC ER) 10-8 MG/5ML SUER Take 5 mLs by mouth every 12 (twelve) hours as needed. 120 mL 0  . cholecalciferol (VITAMIN D) 1000 units tablet Take 1,000 Units by mouth daily.    Marland Kitchen amoxicillin (AMOXIL) 875 MG tablet Take 1 tablet (875 mg total) by mouth 2 (two) times daily. (Patient not taking: Reported on 10/13/2017) 20 tablet 0  . predniSONE (DELTASONE) 20 MG tablet Take 1 tablet (20 mg total) by mouth daily. (Patient not taking: Reported on 10/13/2017) 7 tablet 0   No facility-administered medications prior to visit.     Review of Systems;  Patient denies headache, fevers, malaise, unintentional weight loss, skin rash, eye pain, sinus congestion and sinus pain, sore throat, dysphagia,  hemoptysis , cough, dyspnea, wheezing, chest pain, palpitations, orthopnea, edema, abdominal pain, nausea, melena, diarrhea, constipation, flank  pain, dysuria, hematuria, urinary  Frequency, nocturia, numbness, tingling, seizures,  Focal weakness, Loss of consciousness,  Tremor, insomnia, depression, anxiety, and suicidal ideation.      Objective:  BP 106/60 (BP Location: Left Arm, Patient Position: Sitting, Cuff Size: Normal)   Pulse (!) 104   Temp 98.2 F (36.8 C) (Oral)   Resp 16   Ht 4\' 11"  (1.499 m)   Wt 104 lb 9.6 oz (47.4 kg)   SpO2 96%   BMI 21.13 kg/m   BP Readings from Last 3 Encounters:  10/13/17 106/60  10/03/17 113/67  09/14/17 110/60    Wt Readings from Last 3 Encounters:  10/13/17 104 lb 9.6 oz (47.4 kg)  10/03/17 106 lb (48.1 kg)  09/14/17 107 lb (48.5 kg)    General appearance: alert, cooperative and appears stated age Ears: normal TM's and external ear canals both ears Throat: lips, mucosa, and tongue normal; teeth and gums normal Neck: no adenopathy, no carotid bruit, supple, symmetrical, trachea midline and thyroid not enlarged, symmetric, no tenderness/mass/nodules Back: symmetric, no curvature. ROM normal. No CVA tenderness. Lungs: clear to auscultation bilaterally Heart: regular rate and rhythm, S1, S2 normal, no murmur, click, rub or gallop Abdomen: soft, non-tender; bowel sounds normal; no masses,  no organomegaly Pulses: 2+ and symmetric Skin: Skin color, texture, turgor normal. No rashes or lesions Lymph nodes: Cervical, supraclavicular, and axillary nodes normal.  No results found for: HGBA1C  Lab Results  Component Value Date   CREATININE 0.73 01/01/2017    Lab Results  Component Value Date   WBC 8.2 01/01/2017   HGB 14.5 01/01/2017   HCT 42.7 01/01/2017   PLT 214.0 01/01/2017   GLUCOSE 95 01/01/2017   CHOL 198 01/01/2017   TRIG 71.0 01/01/2017   HDL 84.30 01/01/2017   LDLCALC 99 01/01/2017   ALT 20 01/01/2017   AST 16 01/01/2017   NA 140 01/01/2017   K 4.7 01/01/2017   CL 106 01/01/2017   CREATININE 0.73 01/01/2017   BUN 19 01/01/2017   CO2 31 01/01/2017   TSH  1.38 01/01/2017    No results found.  Assessment & Plan:   Problem List Items Addressed This Visit    Recurrent sinusitis    Three episodes since September,  The first two treated with antibitiocs, the last one with prednisone.  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  Recommended saline irrigation, decongestants, and prednisone .  Add 3rd round of antibiotic if no improvement in 48 hours,  And refer to ENT if so,  For evaluation of sinuses.       Relevant Medications   fluticasone (FLONASE) 50 MCG/ACT nasal spray   predniSONE (DELTASONE) 10 MG tablet   amoxicillin-clavulanate (AUGMENTIN) 875-125 MG tablet     A total of 25 minutes of face to face time was spent with patient more than half of which was spent in counselling about the above mentioned conditions  and coordination of care  I have discontinued Sharna Gabrys. Kloster's amoxicillin and predniSONE. I am also having her start on fluticasone, predniSONE, and amoxicillin-clavulanate. Additionally, I am having her maintain her cholecalciferol, ALPRAZolam, and chlorpheniramine-HYDROcodone.  Meds ordered this encounter  Medications  . fluticasone (FLONASE) 50 MCG/ACT nasal spray    Sig: Place 2 sprays daily into both nostrils.    Dispense:  16 g    Refill:  6  . predniSONE (DELTASONE) 10 MG tablet    Sig: 6 tablets on Day 1 , then reduce by 1 tablet daily until gone    Dispense:  21 tablet    Refill:  0  . amoxicillin-clavulanate (AUGMENTIN) 875-125 MG tablet    Sig: Take 1 tablet 2 (two) times daily by mouth.    Dispense:  20 tablet    Refill:  0    Medications Discontinued During This Encounter  Medication Reason  . amoxicillin (AMOXIL) 875 MG tablet Completed Course  . predniSONE (DELTASONE) 20 MG tablet Completed Course    Follow-up: No Follow-up on file.   Crecencio Mc, MD

## 2017-10-16 DIAGNOSIS — J329 Chronic sinusitis, unspecified: Secondary | ICD-10-CM | POA: Insufficient documentation

## 2017-10-16 NOTE — Assessment & Plan Note (Signed)
Three episodes since September,  The first two treated with antibitiocs, the last one with prednisone.  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  Recommended saline irrigation, decongestants, and prednisone .  Add 3rd round of antibiotic if no improvement in 48 hours,  And refer to ENT if so,  For evaluation of sinuses.

## 2018-02-04 ENCOUNTER — Ambulatory Visit (INDEPENDENT_AMBULATORY_CARE_PROVIDER_SITE_OTHER): Payer: Medicare Other | Admitting: Internal Medicine

## 2018-02-04 VITALS — BP 118/64 | HR 78 | Temp 98.4°F | Resp 18 | Wt 105.0 lb

## 2018-02-04 DIAGNOSIS — Z86010 Personal history of colonic polyps: Secondary | ICD-10-CM | POA: Diagnosis not present

## 2018-02-04 DIAGNOSIS — Z13220 Encounter for screening for lipoid disorders: Secondary | ICD-10-CM | POA: Diagnosis not present

## 2018-02-04 DIAGNOSIS — M810 Age-related osteoporosis without current pathological fracture: Secondary | ICD-10-CM | POA: Diagnosis not present

## 2018-02-04 DIAGNOSIS — Z0000 Encounter for general adult medical examination without abnormal findings: Secondary | ICD-10-CM

## 2018-02-04 MED ORDER — ALPRAZOLAM 0.25 MG PO TABS: ORAL_TABLET | ORAL | 1 refills | Status: DC

## 2018-02-04 NOTE — Progress Notes (Signed)
Patient ID: Toni Gardner, female   DOB: 10-24-1951, 67 y.o.   MRN: [id number]   Subjective:    Patient ID: Toni Gardner, female    DOB: May 13, 1951, 67 y.o.   MRN: [id number]  HPI  Patient here for her physical exam.  States she is doing well.  Feels good.  Stays active.  No chest pain.  No sob. No acid reflux. No abdominal pain.  Bowels moving.  Overall feels good.  Handling stress.  Takes xanax prn.     Past Medical History:  Diagnosis Date  . Colon polyp   . Urine incontinence    Past Surgical History:  Procedure Laterality Date  . ABDOMINAL HYSTERECTOMY    . ADENOIDECTOMY     Family History  Problem Relation Age of Onset  . Heart disease Father   . Cancer Maternal Aunt        breast  . Breast cancer Maternal Aunt   . Cancer Maternal Uncle        colon   Social History   Socioeconomic History  . Marital status: Married    Spouse name: None  . Number of children: None  . Years of education: None  . Highest education level: None  Social Needs  . Financial resource strain: None  . Food insecurity - worry: None  . Food insecurity - inability: None  . Transportation needs - medical: None  . Transportation needs - non-medical: None  Occupational History  . None  Tobacco Use  . Smoking status: Never Smoker  . Smokeless tobacco: Never Used  Substance and Sexual Activity  . Alcohol use: Yes  . Drug use: No  . Sexual activity: None  Other Topics Concern  . None  Social History Narrative  . None    Outpatient Encounter Medications as of 02/04/2018  Medication Sig  . ALPRAZolam (XANAX) 0.25 MG tablet 1/2 tablet q day prn  . cholecalciferol (VITAMIN D) 1000 units tablet Take 1,000 Units by mouth daily.  . fluticasone (FLONASE) 50 MCG/ACT nasal spray Place 2 sprays daily into both nostrils.  . [DISCONTINUED] ALPRAZolam (XANAX) 0.25 MG tablet 1/2 tablet q day prn  . [DISCONTINUED] amoxicillin-clavulanate (AUGMENTIN) 875-125 MG tablet Take 1 tablet 2 (two) times daily by  mouth.  . [DISCONTINUED] chlorpheniramine-HYDROcodone (TUSSIONEX PENNKINETIC ER) 10-8 MG/5ML SUER Take 5 mLs by mouth every 12 (twelve) hours as needed.  . [DISCONTINUED] predniSONE (DELTASONE) 10 MG tablet 6 tablets on Day 1 , then reduce by 1 tablet daily until gone   No facility-administered encounter medications on file as of 02/04/2018.     Review of Systems  Constitutional: Negative for appetite change and unexpected weight change.  HENT: Negative for congestion and sinus pressure.   Eyes: Negative for pain and visual disturbance.  Respiratory: Negative for cough, chest tightness and shortness of breath.   Cardiovascular: Negative for chest pain, palpitations and leg swelling.  Gastrointestinal: Negative for abdominal pain, diarrhea, nausea and vomiting.  Genitourinary: Negative for difficulty urinating and dysuria.  Musculoskeletal: Negative for joint swelling and myalgias.  Skin: Negative for color change and rash.  Neurological: Negative for dizziness, light-headedness and headaches.  Hematological: Negative for adenopathy. Does not bruise/bleed easily.  Psychiatric/Behavioral: Negative for agitation and dysphoric mood.       Objective:    Physical Exam  Constitutional: She is oriented to person, place, and time. She appears well-developed and well-nourished. No distress.  HENT:  Nose: Nose normal.  Mouth/Throat: Oropharynx is clear and moist.  Eyes: Right eye exhibits no discharge. Left eye exhibits no discharge. No scleral icterus.  Neck: Neck supple. No thyromegaly present.  Cardiovascular: Normal rate and regular rhythm.  Pulmonary/Chest: Breath sounds normal. No accessory muscle usage. No tachypnea. No respiratory distress. She has no decreased breath sounds. She has no wheezes. She has no rhonchi. Right breast exhibits no inverted nipple, no mass, no nipple discharge and no tenderness (no axillary adenopathy). Left breast exhibits no inverted nipple, no mass, no nipple  discharge and no tenderness (no axilarry adenopathy).  Abdominal: Soft. Bowel sounds are normal. There is no tenderness.  Musculoskeletal: She exhibits no edema or tenderness.  Lymphadenopathy:    She has no cervical adenopathy.  Neurological: She is alert and oriented to person, place, and time.  Skin: Skin is warm. No rash noted. No erythema.  Psychiatric: She has a normal mood and affect. Her behavior is normal.    BP 118/64   Pulse 78   Temp 98.4 F (36.9 C) (Oral)   Resp 18   Wt 105 lb (47.6 kg)   SpO2 98%   BMI 21.21 kg/m  Wt Readings from Last 3 Encounters:  02/04/18 105 lb (47.6 kg)  10/13/17 104 lb 9.6 oz (47.4 kg)  10/03/17 106 lb (48.1 kg)     Lab Results  Component Value Date   WBC 8.2 01/01/2017   HGB 14.5 01/01/2017   HCT 42.7 01/01/2017   PLT 214.0 01/01/2017   GLUCOSE 95 01/01/2017   CHOL 198 01/01/2017   TRIG 71.0 01/01/2017   HDL 84.30 01/01/2017   LDLCALC 99 01/01/2017   ALT 20 01/01/2017   AST 16 01/01/2017   NA 140 01/01/2017   K 4.7 01/01/2017   CL 106 01/01/2017   CREATININE 0.73 01/01/2017   BUN 19 01/01/2017   CO2 31 01/01/2017   TSH 1.38 01/01/2017       Assessment & Plan:   Problem List Items Addressed This Visit    Health care maintenance    Physical today 02/04/18.  Schedule mammogram.  Colonoscopy 06/28/08 - diverticulosis.        History of colonic polyps    Colonoscopy 06/28/08 - mild diverticulosis.  Per pt, due f/u 10 years.        Osteoporosis    Previously on fosamax.  Have discussed f/u bone density.  Will notify me when agreeable.        Relevant Orders   CBC with Differential/Platelet   Comprehensive metabolic panel   TSH   VITAMIN D 25 Hydroxy (Vit-D Deficiency, Fractures)    Other Visit Diagnoses    Routine general medical examination at a health care facility    -  Primary   Screening cholesterol level       Relevant Orders   Lipid panel       Einar Pheasant, MD

## 2018-02-04 NOTE — Assessment & Plan Note (Addendum)
Physical today 02/04/18.  Schedule mammogram.  Colonoscopy 06/28/08 - diverticulosis.

## 2018-02-07 ENCOUNTER — Encounter: Payer: Self-pay | Admitting: Internal Medicine

## 2018-02-07 NOTE — Assessment & Plan Note (Signed)
Colonoscopy 06/28/08 - mild diverticulosis.  Per pt, due f/u 10 years.

## 2018-02-07 NOTE — Assessment & Plan Note (Signed)
Previously on fosamax.  Have discussed f/u bone density.  Will notify me when agreeable.

## 2018-02-16 ENCOUNTER — Other Ambulatory Visit: Payer: Self-pay | Admitting: Internal Medicine

## 2018-02-16 DIAGNOSIS — Z1231 Encounter for screening mammogram for malignant neoplasm of breast: Secondary | ICD-10-CM

## 2018-02-21 ENCOUNTER — Other Ambulatory Visit (INDEPENDENT_AMBULATORY_CARE_PROVIDER_SITE_OTHER): Payer: Medicare Other

## 2018-02-21 DIAGNOSIS — M810 Age-related osteoporosis without current pathological fracture: Secondary | ICD-10-CM

## 2018-02-21 DIAGNOSIS — Z13220 Encounter for screening for lipoid disorders: Secondary | ICD-10-CM | POA: Diagnosis not present

## 2018-02-21 LAB — COMPREHENSIVE METABOLIC PANEL
ALBUMIN: 4.1 g/dL (ref 3.5–5.2)
ALK PHOS: 72 U/L (ref 39–117)
ALT: 18 U/L (ref 0–35)
AST: 15 U/L (ref 0–37)
BILIRUBIN TOTAL: 0.4 mg/dL (ref 0.2–1.2)
BUN: 20 mg/dL (ref 6–23)
CO2: 27 mEq/L (ref 19–32)
CREATININE: 0.8 mg/dL (ref 0.40–1.20)
Calcium: 10.1 mg/dL (ref 8.4–10.5)
Chloride: 104 mEq/L (ref 96–112)
GFR: 75.7 mL/min (ref >60.00)
Glucose, Bld: 101 mg/dL — ABNORMAL HIGH (ref 70–99)
POTASSIUM: 5.1 meq/L (ref 3.5–5.1)
SODIUM: 139 meq/L (ref 135–145)
Total Protein: 7.2 g/dL (ref 6.0–8.3)

## 2018-02-21 LAB — LIPID PANEL
Cholesterol: 190 mg/dL (ref 0–200)
HDL: 91.8 mg/dL (ref >39.00)
LDL Cholesterol: 85 mg/dL (ref 0–99)
NonHDL: 97.76
Total CHOL/HDL Ratio: 2
Triglycerides: 62 mg/dL (ref 0.0–149.0)
VLDL: 12.4 mg/dL (ref 0.0–40.0)

## 2018-02-21 LAB — CBC WITH DIFFERENTIAL/PLATELET
Basophils Absolute: 0 10*3/uL (ref 0.0–0.1)
Basophils Relative: 0.4 % (ref 0.0–3.0)
EOS ABS: 0.2 10*3/uL (ref 0.0–0.7)
Eosinophils Relative: 2.5 % (ref 0.0–5.0)
HCT: 41.9 % (ref 36.0–46.0)
HEMOGLOBIN: 14.5 g/dL (ref 12.0–15.0)
Lymphocytes Relative: 33.5 % (ref 12.0–46.0)
Lymphs Abs: 2.4 10*3/uL (ref 0.7–4.0)
MCHC: 34.5 g/dL (ref 30.0–36.0)
MCV: 92.9 fl (ref 78.0–100.0)
MONO ABS: 0.7 10*3/uL (ref 0.1–1.0)
Monocytes Relative: 9.8 % (ref 3.0–12.0)
NEUTROS PCT: 53.8 % (ref 43.0–77.0)
Neutro Abs: 3.9 10*3/uL (ref 1.4–7.7)
Platelets: 225 10*3/uL (ref 150.0–400.0)
RBC: 4.51 Mil/uL (ref 3.87–5.11)
RDW: 14.6 % (ref 11.5–15.5)
WBC: 7.3 10*3/uL (ref 4.0–10.5)

## 2018-02-21 LAB — VITAMIN D 25 HYDROXY (VIT D DEFICIENCY, FRACTURES): VITD: 35.43 ng/mL (ref 30.00–100.00)

## 2018-02-21 LAB — TSH: TSH: 1.61 u[IU]/mL (ref 0.35–4.50)

## 2018-02-22 ENCOUNTER — Encounter: Payer: Self-pay | Admitting: Internal Medicine

## 2018-03-09 ENCOUNTER — Ambulatory Visit
Admission: RE | Admit: 2018-03-09 | Discharge: 2018-03-09 | Disposition: A | Discharge status: Home / Self Care | Payer: Medicare Other | Source: Ambulatory Visit | Attending: Internal Medicine | Admitting: Internal Medicine

## 2018-03-09 DIAGNOSIS — Z1231 Encounter for screening mammogram for malignant neoplasm of breast: Secondary | ICD-10-CM | POA: Insufficient documentation

## 2018-08-25 ENCOUNTER — Ambulatory Visit: Payer: Medicare Other | Admitting: Internal Medicine

## 2018-08-25 ENCOUNTER — Encounter: Payer: Self-pay | Admitting: Internal Medicine

## 2018-08-25 DIAGNOSIS — Z23 Encounter for immunization: Secondary | ICD-10-CM

## 2018-08-25 DIAGNOSIS — M810 Age-related osteoporosis without current pathological fracture: Secondary | ICD-10-CM | POA: Diagnosis not present

## 2018-08-25 DIAGNOSIS — Z86010 Personal history of colonic polyps: Secondary | ICD-10-CM | POA: Diagnosis not present

## 2018-08-25 MED ORDER — ALPRAZOLAM 0.25 MG PO TABS: ORAL_TABLET | ORAL | 1 refills | Status: DC

## 2018-08-25 NOTE — Progress Notes (Signed)
Patient ID: Toni Gardner, female   DOB: 06/22/1951, 68 y.o.   MRN: [id number] .   Subjective:    Patient ID: Toni Gardner, female    DOB: 07/05/1951, 67 y.o.   MRN: [id number]  HPI  Patient here for a scheduled follow up. She reports she is doing well  Stays active.  No chest pain.  No sob.  No acid reflux.  No abdominal pain.  Bowels moving.  No urine change.  Overall feels good.  Due colonoscopy.  Wants to see Dr Bary Castilla.    Past Medical History:  Diagnosis Date  . Colon polyp   . Urine incontinence    Past Surgical History:  Procedure Laterality Date  . ABDOMINAL HYSTERECTOMY    . ADENOIDECTOMY     Family History  Problem Relation Age of Onset  . Heart disease Father   . Breast cancer Maternal Aunt   . Cancer Maternal Uncle        colon   Social History   Socioeconomic History  . Marital status: Married    Spouse name: Not on file  . Number of children: Not on file  . Years of education: Not on file  . Highest education level: Not on file  Occupational History  . Not on file  Social Needs  . Financial resource strain: Not on file  . Food insecurity:    Worry: Not on file    Inability: Not on file  . Transportation needs:    Medical: Not on file    Non-medical: Not on file  Tobacco Use  . Smoking status: Never Smoker  . Smokeless tobacco: Never Used  Substance and Sexual Activity  . Alcohol use: Yes  . Drug use: No  . Sexual activity: Not on file  Lifestyle  . Physical activity:    Days per week: Not on file    Minutes per session: Not on file  . Stress: Not on file  Relationships  . Social connections:    Talks on phone: Not on file    Gets together: Not on file    Attends religious service: Not on file    Active member of club or organization: Not on file    Attends meetings of clubs or organizations: Not on file    Relationship status: Not on file  Other Topics Concern  . Not on file  Social History Narrative  . Not on file    Outpatient  Encounter Medications as of 08/25/2018  Medication Sig  . ALPRAZolam (XANAX) 0.25 MG tablet 1/2 tablet q day prn  . cholecalciferol (VITAMIN D) 1000 units tablet Take 1,000 Units by mouth daily.  . fluticasone (FLONASE) 50 MCG/ACT nasal spray Place 2 sprays daily into both nostrils.  . [DISCONTINUED] ALPRAZolam (XANAX) 0.25 MG tablet 1/2 tablet q day prn   No facility-administered encounter medications on file as of 08/25/2018.     Review of Systems  Constitutional: Negative for appetite change and unexpected weight change.  HENT: Negative for congestion and sinus pressure.   Respiratory: Negative for cough, chest tightness and shortness of breath.   Cardiovascular: Negative for chest pain, palpitations and leg swelling.  Gastrointestinal: Negative for abdominal pain, diarrhea, nausea and vomiting.  Genitourinary: Negative for difficulty urinating and dysuria.  Musculoskeletal: Negative for joint swelling and myalgias.  Skin: Negative for color change and rash.  Neurological: Negative for dizziness, light-headedness and headaches.  Psychiatric/Behavioral: Negative for agitation and dysphoric mood.  Objective:    Physical Exam  Constitutional: She appears well-developed and well-nourished. No distress.  HENT:  Nose: Nose normal.  Mouth/Throat: Oropharynx is clear and moist.  Neck: Neck supple. No thyromegaly present.  Cardiovascular: Normal rate and regular rhythm.  Pulmonary/Chest: Breath sounds normal. No respiratory distress. She has no wheezes.  Abdominal: Soft. Bowel sounds are normal. There is no tenderness.  Musculoskeletal: She exhibits no edema or tenderness.  Lymphadenopathy:    She has no cervical adenopathy.  Skin: No rash noted. No erythema.  Psychiatric: She has a normal mood and affect. Her behavior is normal.    BP 110/72 (BP Location: Left Arm, Patient Position: Sitting, Cuff Size: Normal)   Pulse 82   Temp 98.7 F (37.1 C) (Oral)   Resp 15   Ht 4'  11" (1.499 m)   Wt 102 lb 8 oz (46.5 kg)   SpO2 98%   BMI 20.70 kg/m  Wt Readings from Last 3 Encounters:  08/25/18 102 lb 8 oz (46.5 kg)  02/04/18 105 lb (47.6 kg)  10/13/17 104 lb 9.6 oz (47.4 kg)     Lab Results  Component Value Date   WBC 7.3 02/21/2018   HGB 14.5 02/21/2018   HCT 41.9 02/21/2018   PLT 225.0 02/21/2018   GLUCOSE 101 (H) 02/21/2018   CHOL 190 02/21/2018   TRIG 62.0 02/21/2018   HDL 91.80 02/21/2018   LDLCALC 85 02/21/2018   ALT 18 02/21/2018   AST 15 02/21/2018   NA 139 02/21/2018   K 5.1 02/21/2018   CL 104 02/21/2018   CREATININE 0.80 02/21/2018   BUN 20 02/21/2018   CO2 27 02/21/2018   TSH 1.61 02/21/2018    Mm Screening Breast Tomo Bilateral  Result Date: 03/09/2018 CLINICAL DATA:  Screening. EXAM: DIGITAL SCREENING BILATERAL MAMMOGRAM WITH TOMO AND CAD COMPARISON:  Previous exam(s). ACR Breast Density Category c: The breast tissue is heterogeneously dense, which may obscure small masses. FINDINGS: There are no findings suspicious for malignancy. Images were processed with CAD. IMPRESSION: No mammographic evidence of malignancy. A result letter of this screening mammogram will be mailed directly to the patient. RECOMMENDATION: Screening mammogram in one year. (Code:SM-B-01Y) BI-RADS CATEGORY  1: Negative. Electronically Signed   By: Claudie Revering M.D.   On: 03/09/2018 15:52       Assessment & Plan:   Problem List Items Addressed This Visit    History of colonic polyps    Last colonoscopy 06/2008 - mild diverticulosis.  Due f/u 06/2018.  Refer to Dr Bary Castilla.        Relevant Orders   Ambulatory referral to General Surgery   Osteoporosis    Has previously been on fosamax.  Will notify me when agreeable for f/u bone density.         Other Visit Diagnoses    Need for influenza vaccination       Relevant Orders   Flu vaccine HIGH DOSE PF (Fluzone High dose) (Completed)       Einar Pheasant, MD

## 2018-08-28 NOTE — Assessment & Plan Note (Signed)
Last colonoscopy 06/2008 - mild diverticulosis.  Due f/u 06/2018.  Refer to Dr Bary Castilla.

## 2018-08-28 NOTE — Assessment & Plan Note (Signed)
Has previously been on fosamax.  Will notify me when agreeable for f/u bone density.

## 2018-09-08 ENCOUNTER — Ambulatory Visit (INDEPENDENT_AMBULATORY_CARE_PROVIDER_SITE_OTHER): Payer: Medicare Other

## 2018-09-08 DIAGNOSIS — Z23 Encounter for immunization: Secondary | ICD-10-CM

## 2018-09-08 NOTE — Progress Notes (Signed)
Pt was here today for a NV to receive their PPV 23 they did receive their Prevnar 13 in 2018.   Vaccination was given in RD pt seem to tolerate well.

## 2018-10-11 ENCOUNTER — Ambulatory Visit: Payer: Medicare Other | Admitting: General Surgery

## 2018-10-11 ENCOUNTER — Other Ambulatory Visit: Payer: Self-pay

## 2018-10-11 ENCOUNTER — Encounter: Payer: Self-pay | Admitting: General Surgery

## 2018-10-11 VITALS — BP 119/75 | HR 106 | Temp 97.9°F | Resp 13 | Ht 60.0 in | Wt 103.0 lb

## 2018-10-11 DIAGNOSIS — Z1211 Encounter for screening for malignant neoplasm of colon: Secondary | ICD-10-CM

## 2018-10-11 NOTE — Progress Notes (Signed)
Patient ID: Toni Gardner, female   DOB: Oct 25, 1951, 67 y.o.   MRN: [id number]  Chief Complaint  Patient presents with  . Colonoscopy    HPI Toni Gardner is a 67 y.o. female.  Who presents for a colonoscopy discussion referred by Dr Einar Pheasant. The last colonoscopy was completed in 2009. Denies any gastrointestinal issues. Bowels move regular and no bleeding noted.  HPI  Past Medical History:  Diagnosis Date  . Colon polyp   . Urine incontinence     Past Surgical History:  Procedure Laterality Date  . ABDOMINAL HYSTERECTOMY    . ADENOIDECTOMY    . COLONOSCOPY  06/28/2008   Dr Bary Castilla    Family History  Problem Relation Age of Onset  . Heart disease Father   . Breast cancer Maternal Aunt   . Cancer Maternal Uncle        colon  . Colon polyps Sister     Social History Social History   Tobacco Use  . Smoking status: Never Smoker  . Smokeless tobacco: Never Used  Substance Use Topics  . Alcohol use: Yes  . Drug use: No    Allergies  Allergen Reactions  . Sulfa Antibiotics Swelling    Current Outpatient Medications  Medication Sig Dispense Refill  . ALPRAZolam (XANAX) 0.25 MG tablet 1/2 tablet q day prn 30 tablet 1  . cholecalciferol (VITAMIN D) 1000 units tablet Take 1,000 Units by mouth daily.    . fluticasone (FLONASE) 50 MCG/ACT nasal spray Place 2 sprays daily into both nostrils. 16 g 6  . Probiotic Product (ALIGN) 4 MG CAPS Take by mouth daily.     No current facility-administered medications for this visit.     Review of Systems Review of Systems  Constitutional: Negative.   Respiratory: Positive for cough.   Cardiovascular: Negative.   Gastrointestinal: Negative for blood in stool, constipation and diarrhea.    Blood pressure 119/75, pulse (!) 106, temperature 97.9 F (36.6 C), temperature source Skin, resp. rate 13, height 5' (1.524 m), weight 103 lb (46.7 kg).  Physical Exam Physical Exam  Constitutional: She is oriented to person,  place, and time. She appears well-developed and well-nourished.  HENT:  Mouth/Throat: Oropharynx is clear and moist. No oropharyngeal exudate.  Eyes: Conjunctivae are normal. No scleral icterus.  Neck: Neck supple.  Cardiovascular: Normal rate, regular rhythm and normal heart sounds.  Pulmonary/Chest: Effort normal and breath sounds normal.  Neurological: She is alert and oriented to person, place, and time.  Skin: Skin is warm and dry.  Psychiatric: Her behavior is normal.    Data Reviewed Colonoscopy dated June 28, 2008 was notable for a few diverticuli.  Primarily in the sigmoid colon and to a lesser extent in the ascending colon.  Assessment    Candidate for screening colon exam.    Plan    Discussed Cologuard vs colonoscopy, she wishes to have Cologuard testing.  The patient is aware that if the Cologuard test is positive formal colonoscopy will be recommended.   Colonoscopy with possible biopsy/polypectomy prn: Information regarding the procedure, including its potential risks and complications (including but not limited to perforation of the bowel, which may require emergency surgery to repair, and bleeding) was verbally given to the patient. Educational information regarding lower intestinal endoscopy was given to the patient. Written instructions for how to complete the bowel prep using Miralax were provided. The importance of drinking ample fluids to avoid dehydration as a result of the prep emphasized.  HPI, Physical Exam, Assessment and Plan have been scribed under the direction and in the presence of Robert Bellow, MD. Karie Fetch, RN  I have completed the exam and reviewed the above documentation for accuracy and completeness.  I agree with the above.  Haematologist has been used and any errors in dictation or transcription are unintentional.  Hervey Ard, M.D., F.A.C.S.  Toni Gardner 10/12/2018, 5:01 PM

## 2018-10-11 NOTE — Patient Instructions (Addendum)
   The patient is aware to call back for any questions or concerns. Cologuard

## 2018-10-17 LAB — COLOGUARD: Cologuard: NEGATIVE

## 2018-11-07 ENCOUNTER — Encounter: Payer: Self-pay | Admitting: General Surgery

## 2018-11-07 NOTE — Progress Notes (Signed)
Noted.  Please update chart if not already.  Thanks

## 2018-11-07 NOTE — Progress Notes (Signed)
Cologuard testing completed October 17, 2018 was negative.  The patient has been encouraged to have a repeat exam in 3 years with her PCP.

## 2018-11-08 ENCOUNTER — Telehealth: Payer: Self-pay

## 2018-11-08 NOTE — Telephone Encounter (Signed)
-----   Message from Robert Bellow, MD sent at 11/07/2018  5:06 PM EST ----- Please notify the patient had her Cologuard test was negative.  She should plan on a repeat exam with her PCP in 3 years.  Thank you

## 2018-11-08 NOTE — Telephone Encounter (Signed)
Notified patient as instructed, patient pleased. Discussed follow-up appointments, patient agrees  

## 2018-11-08 NOTE — Progress Notes (Signed)
Chart updated. Cologuard abstracted

## 2019-02-08 ENCOUNTER — Telehealth: Payer: Medicare Other | Admitting: Nurse Practitioner

## 2019-02-08 DIAGNOSIS — R05 Cough: Secondary | ICD-10-CM

## 2019-02-08 DIAGNOSIS — R059 Cough, unspecified: Secondary | ICD-10-CM

## 2019-02-08 MED ORDER — BENZONATATE 100 MG PO CAPS: 100.0000 mg | ORAL_CAPSULE | Freq: Three times a day (TID) | ORAL | 0 refills | Status: DC | PRN

## 2019-02-08 MED ORDER — PREDNISONE 10 MG (21) PO TBPK: ORAL_TABLET | ORAL | 0 refills | Status: DC

## 2019-02-08 NOTE — Progress Notes (Signed)
We are sorry that you are not feeling well.  Here is how we plan to help!  Based on your presentation I believe you most likely have A cough due to a virus.  This is called viral bronchitis and is best treated by rest, plenty of fluids and control of the cough.  You may use Ibuprofen or Tylenol as directed to help your symptoms.     In addition you may use A prescription cough medication called Tessalon Perles 100mg. You may take 1-2 capsules every 8 hours as needed for your cough.  Prednisone 10 mg daily for 6 days (see taper instructions below)  Directions for 6 day taper: Day 1: 2 tablets before breakfast, 1 after both lunch & dinner and 2 at bedtime Day 2: 1 tab before breakfast, 1 after both lunch & dinner and 2 at bedtime Day 3: 1 tab at each meal & 1 at bedtime Day 4: 1 tab at breakfast, 1 at lunch, 1 at bedtime Day 5: 1 tab at breakfast & 1 tab at bedtime Day 6: 1 tab at breakfast   From your responses in the eVisit questionnaire you describe inflammation in the upper respiratory tract which is causing a significant cough.  This is commonly called Bronchitis and has four common causes:    Allergies  Viral Infections  Acid Reflux  Bacterial Infection Allergies, viruses and acid reflux are treated by controlling symptoms or eliminating the cause. An example might be a cough caused by taking certain blood pressure medications. You stop the cough by changing the medication. Another example might be a cough caused by acid reflux. Controlling the reflux helps control the cough.  USE OF BRONCHODILATOR ("RESCUE") INHALERS: There is a risk from using your bronchodilator too frequently.  The risk is that over-reliance on a medication which only relaxes the muscles surrounding the breathing tubes can reduce the effectiveness of medications prescribed to reduce swelling and congestion of the tubes themselves.  Although you feel brief relief from the bronchodilator inhaler, your asthma may  actually be worsening with the tubes becoming more swollen and filled with mucus.  This can delay other crucial treatments, such as oral steroid medications. If you need to use a bronchodilator inhaler daily, several times per day, you should discuss this with your provider.  There are probably better treatments that could be used to keep your asthma under control.     HOME CARE . Only take medications as instructed by your medical team. . Complete the entire course of an antibiotic. . Drink plenty of fluids and get plenty of rest. . Avoid close contacts especially the very young and the elderly . Cover your mouth if you cough or cough into your sleeve. . Always remember to wash your hands . A steam or ultrasonic humidifier can help congestion.   GET HELP RIGHT AWAY IF: . You develop worsening fever. . You become short of breath . You cough up blood. . Your symptoms persist after you have completed your treatment plan MAKE SURE YOU   Understand these instructions.  Will watch your condition.  Will get help right away if you are not doing well or get worse.  Your e-visit answers were reviewed by a board certified advanced clinical practitioner to complete your personal care plan.  Depending on the condition, your plan could have included both over the counter or prescription medications. If there is a problem please reply  once you have received a response from your provider. Your   safety is important to [place].  If you have drug allergies check your prescription carefully.    You can use MyChart to ask questions about today's visit, request a non-urgent call back, or ask for a work or school excuse for 24 hours related to this e-Visit. If it has been greater than 24 hours you will need to follow up with your provider, or enter a new e-Visit to address those concerns. You will get an e-mail in the next two days asking about your experience.  I hope that your e-visit has been valuable and will  speed your recovery. Thank you for using e-visits.  5 minutes spent reviewing and documenting in chart.

## 2019-02-23 ENCOUNTER — Ambulatory Visit: Payer: Medicare Other | Admitting: Internal Medicine

## 2019-02-23 ENCOUNTER — Encounter: Payer: Self-pay | Admitting: Internal Medicine

## 2019-02-23 ENCOUNTER — Other Ambulatory Visit: Payer: Self-pay

## 2019-02-23 VITALS — BP 120/80 | HR 103 | Temp 98.2°F | Wt 102.4 lb

## 2019-02-23 DIAGNOSIS — Z1239 Encounter for other screening for malignant neoplasm of breast: Secondary | ICD-10-CM | POA: Diagnosis not present

## 2019-02-23 DIAGNOSIS — Z13220 Encounter for screening for lipoid disorders: Secondary | ICD-10-CM | POA: Diagnosis not present

## 2019-02-23 DIAGNOSIS — F439 Reaction to severe stress, unspecified: Secondary | ICD-10-CM

## 2019-02-23 DIAGNOSIS — R1032 Left lower quadrant pain: Secondary | ICD-10-CM

## 2019-02-23 DIAGNOSIS — R1084 Generalized abdominal pain: Secondary | ICD-10-CM

## 2019-02-23 DIAGNOSIS — R109 Unspecified abdominal pain: Secondary | ICD-10-CM | POA: Insufficient documentation

## 2019-02-23 DIAGNOSIS — Z0000 Encounter for general adult medical examination without abnormal findings: Secondary | ICD-10-CM

## 2019-02-23 LAB — CBC WITH DIFFERENTIAL/PLATELET
Basophils Absolute: 0 10*3/uL (ref 0.0–0.1)
Basophils Relative: 0.3 % (ref 0.0–3.0)
Eosinophils Absolute: 0 10*3/uL (ref 0.0–0.7)
Eosinophils Relative: 0.3 % (ref 0.0–5.0)
HCT: 44 % (ref 36.0–46.0)
Hemoglobin: 14.8 g/dL (ref 12.0–15.0)
Lymphocytes Relative: 19.2 % (ref 12.0–46.0)
Lymphs Abs: 2.7 10*3/uL (ref 0.7–4.0)
MCHC: 33.5 g/dL (ref 30.0–36.0)
MCV: 93.6 fl (ref 78.0–100.0)
Monocytes Absolute: 0.9 10*3/uL (ref 0.1–1.0)
Monocytes Relative: 6.3 % (ref 3.0–12.0)
Neutro Abs: 10.2 10*3/uL — ABNORMAL HIGH (ref 1.4–7.7)
Neutrophils Relative %: 73.9 % (ref 43.0–77.0)
Platelets: 278 10*3/uL (ref 150.0–400.0)
RBC: 4.7 Mil/uL (ref 3.87–5.11)
RDW: 14.5 % (ref 11.5–15.5)
WBC: 13.8 10*3/uL — ABNORMAL HIGH (ref 4.0–10.5)

## 2019-02-23 LAB — LIPID PANEL
Cholesterol: 229 mg/dL — ABNORMAL HIGH (ref 0–200)
HDL: 103.1 mg/dL (ref >39.00)
LDL Cholesterol: 112 mg/dL — ABNORMAL HIGH (ref 0–99)
NonHDL: 125.95
Total CHOL/HDL Ratio: 2
Triglycerides: 71 mg/dL (ref 0.0–149.0)
VLDL: 14.2 mg/dL (ref 0.0–40.0)

## 2019-02-23 LAB — HEPATIC FUNCTION PANEL
ALBUMIN: 4.4 g/dL (ref 3.5–5.2)
ALT: 36 U/L — AB (ref 0–35)
AST: 24 U/L (ref 0–37)
Alkaline Phosphatase: 80 U/L (ref 39–117)
Bilirubin, Direct: 0 mg/dL (ref 0.0–0.3)
TOTAL PROTEIN: 7.7 g/dL (ref 6.0–8.3)
Total Bilirubin: 0.3 mg/dL (ref 0.2–1.2)

## 2019-02-23 LAB — TSH: TSH: 0.82 u[IU]/mL (ref 0.35–4.50)

## 2019-02-23 LAB — BASIC METABOLIC PANEL
BUN: 11 mg/dL (ref 6–23)
CHLORIDE: 101 meq/L (ref 96–112)
CO2: 28 meq/L (ref 19–32)
Calcium: 10 mg/dL (ref 8.4–10.5)
Creatinine, Ser: 0.8 mg/dL (ref 0.40–1.20)
GFR: 71.01 mL/min (ref >60.00)
Glucose, Bld: 108 mg/dL — ABNORMAL HIGH (ref 70–99)
POTASSIUM: 4.7 meq/L (ref 3.5–5.1)
Sodium: 136 mEq/L (ref 135–145)

## 2019-02-23 MED ORDER — ALPRAZOLAM 0.25 MG PO TABS: ORAL_TABLET | ORAL | 1 refills | Status: DC

## 2019-02-23 NOTE — Progress Notes (Signed)
Patient ID: Toni Gardner, female   DOB: Jan 15, 1951, 68 y.o.   MRN: [id number]   Subjective:    Patient ID: Toni Gardner, female    DOB: 1951/04/08, 68 y.o.   MRN: [id number]  HPI  Patient here for her physical exam.  She recently had E-Visit for cough and congestion.  Felt to be related to a viral syndrome.  Treated with prednisone and tessalon perles.  Is better.  No significant cough now.  No sinus pressure.  No chest congestion.  No acid reflux.  Does report having LLQ pain/soreness.  Started 6 weeks ago.  Reports is constant, but appears to be better at times.  No change in bowels.  No blood.  No nausea or vomiting.  Has a history of hysterectomy and has had both ovaries removed.  Last colonoscopy - diverticulosis.  Saw Dr Bary Castilla.  cologuard negative 10/17/18.  Increased anxiety with the pain and increased stress from everything going on.  Discussed with her today.  Discussed the possibility of starting an SSRI.     Past Medical History:  Diagnosis Date  . Colon polyp   . Urine incontinence    Past Surgical History:  Procedure Laterality Date  . ABDOMINAL HYSTERECTOMY    . ADENOIDECTOMY    . COLONOSCOPY  06/28/2008   Dr Bary Castilla   Family History  Problem Relation Age of Onset  . Heart disease Father   . Breast cancer Maternal Aunt   . Cancer Maternal Uncle        colon  . Colon polyps Sister    Social History   Socioeconomic History  . Marital status: Married    Spouse name: Not on file  . Number of children: Not on file  . Years of education: Not on file  . Highest education level: Not on file  Occupational History  . Not on file  Social Needs  . Financial resource strain: Not on file  . Food insecurity:    Worry: Not on file    Inability: Not on file  . Transportation needs:    Medical: Not on file    Non-medical: Not on file  Tobacco Use  . Smoking status: Never Smoker  . Smokeless tobacco: Never Used  Substance and Sexual Activity  . Alcohol use: Yes  . Drug  use: No  . Sexual activity: Not on file  Lifestyle  . Physical activity:    Days per week: Not on file    Minutes per session: Not on file  . Stress: Not on file  Relationships  . Social connections:    Talks on phone: Not on file    Gets together: Not on file    Attends religious service: Not on file    Active member of club or organization: Not on file    Attends meetings of clubs or organizations: Not on file    Relationship status: Not on file  Other Topics Concern  . Not on file  Social History Narrative  . Not on file    Outpatient Encounter Medications as of 02/23/2019  Medication Sig  . ALPRAZolam (XANAX) 0.25 MG tablet 1/2 tablet q day prn  . cholecalciferol (VITAMIN D) 1000 units tablet Take 1,000 Units by mouth daily.  . fluticasone (FLONASE) 50 MCG/ACT nasal spray Place 2 sprays daily into both nostrils.  . Probiotic Product (ALIGN) 4 MG CAPS Take by mouth daily.  . [DISCONTINUED] ALPRAZolam (XANAX) 0.25 MG tablet 1/2 tablet q day prn  . [  DISCONTINUED] benzonatate (TESSALON PERLES) 100 MG capsule Take 1 capsule (100 mg total) by mouth 3 (three) times daily as needed for cough.  . [DISCONTINUED] predniSONE (STERAPRED UNI-PAK 21 TAB) 10 MG (21) TBPK tablet As directed x 6 days   No facility-administered encounter medications on file as of 02/23/2019.     Review of Systems  Constitutional: Negative for appetite change and unexpected weight change.  HENT: Negative for congestion and sinus pressure.   Eyes: Negative for pain and visual disturbance.  Respiratory: Negative for cough, chest tightness and shortness of breath.   Cardiovascular: Negative for chest pain, palpitations and leg swelling.  Gastrointestinal: Negative for diarrhea, nausea and vomiting.       LLQ pain as outlined.    Genitourinary: Negative for difficulty urinating and dysuria.  Musculoskeletal: Negative for joint swelling and myalgias.  Skin: Negative for color change and rash.  Neurological:  Negative for dizziness, light-headedness and headaches.  Hematological: Negative for adenopathy. Does not bruise/bleed easily.  Psychiatric/Behavioral: Negative for agitation and dysphoric mood.       Objective:    Physical Exam Constitutional:      General: She is not in acute distress.    Appearance: Normal appearance. She is well-developed.  HENT:     Nose: Nose normal. No congestion.     Mouth/Throat:     Pharynx: No oropharyngeal exudate or posterior oropharyngeal erythema.  Eyes:     General: No scleral icterus.       Right eye: No discharge.        Left eye: No discharge.  Neck:     Musculoskeletal: Neck supple. No muscular tenderness.     Thyroid: No thyromegaly.  Cardiovascular:     Rate and Rhythm: Normal rate and regular rhythm.  Pulmonary:     Effort: No tachypnea, accessory muscle usage or respiratory distress.     Breath sounds: Normal breath sounds. No decreased breath sounds or wheezing.  Chest:     Breasts:        Right: No inverted nipple, mass, nipple discharge or tenderness (no axillary adenopathy).        Left: No inverted nipple, mass, nipple discharge or tenderness (no axilarry adenopathy).  Abdominal:     General: Bowel sounds are normal.     Palpations: Abdomen is soft.     Comments: LLQ soreness to palpation.  No rebound or guarding.   Musculoskeletal:        General: No swelling or tenderness.  Lymphadenopathy:     Cervical: No cervical adenopathy.  Skin:    Findings: No erythema or rash.  Neurological:     Mental Status: She is alert and oriented to person, place, and time.  Psychiatric:        Mood and Affect: Mood normal.        Behavior: Behavior normal.     BP 120/80   Pulse (!) 103   Temp 98.2 F (36.8 C) (Oral)   Wt 102 lb 6.4 oz (46.4 kg)   SpO2 99%   BMI 20.00 kg/m  Wt Readings from Last 3 Encounters:  02/23/19 102 lb 6.4 oz (46.4 kg)  10/11/18 103 lb (46.7 kg)  08/25/18 102 lb 8 oz (46.5 kg)     Lab Results   Component Value Date   WBC 13.8 (H) 02/23/2019   HGB 14.8 02/23/2019   HCT 44.0 02/23/2019   PLT 278.0 02/23/2019   GLUCOSE 108 (H) 02/23/2019   CHOL 229 (H) 02/23/2019  TRIG 71.0 02/23/2019   HDL 103.10 02/23/2019   LDLCALC 112 (H) 02/23/2019   ALT 36 (H) 02/23/2019   AST 24 02/23/2019   NA 136 02/23/2019   K 4.7 02/23/2019   CL 101 02/23/2019   CREATININE 0.80 02/23/2019   BUN 11 02/23/2019   CO2 28 02/23/2019   TSH 0.82 02/23/2019    Mm Screening Breast Tomo Bilateral  Result Date: 03/09/2018 CLINICAL DATA:  Screening. EXAM: DIGITAL SCREENING BILATERAL MAMMOGRAM WITH TOMO AND CAD COMPARISON:  Previous exam(s). ACR Breast Density Category c: The breast tissue is heterogeneously dense, which may obscure small masses. FINDINGS: There are no findings suspicious for malignancy. Images were processed with CAD. IMPRESSION: No mammographic evidence of malignancy. A result letter of this screening mammogram will be mailed directly to the patient. RECOMMENDATION: Screening mammogram in one year. (Code:SM-B-01Y) BI-RADS CATEGORY  1: Negative. Electronically Signed   By: Claudie Revering M.D.   On: 03/09/2018 15:52       Assessment & Plan:   Problem List Items Addressed This Visit    Abdominal pain    Persistent LLQ pain.  No known triggers.  Bowels unchanged.  Eating.  No nausea or vomiting.  Previous colonoscopy - diverticulosis.  Check routine labs.  Also check CT scan.        Relevant Orders   CBC with Differential/Platelet (Completed)   Hepatic function panel (Completed)   TSH (Completed)   Basic metabolic panel (Completed)   CT Abdomen Pelvis W Contrast   Urinalysis, Routine w reflex microscopic (Completed)   Healthcare maintenance    Physical today 02/23/19.  Mammogram 03/09/18 - Birads I.  Schedule f/u mammogram.  cologuard 10/17/18 - negative.        Stress    Increased stress as outlined.  Discussed her increased anxiety.  Stress about her abdominal pain and everything going  on with sickness, etc.  Discussed SSRI. She wants to think about this and will call me if desires to start.  Refilled xanax to have if needed.   Addendum:  rx sent in for zoloft.  Pt called back wanting to stop.         Other Visit Diagnoses    Routine general medical examination at a health care facility    -  Primary   Breast cancer screening       Relevant Orders   MM 3D SCREEN BREAST BILATERAL   Screening cholesterol level       Relevant Orders   Lipid panel (Completed)       Einar Pheasant, MD

## 2019-02-24 ENCOUNTER — Other Ambulatory Visit: Payer: Self-pay | Admitting: Internal Medicine

## 2019-02-24 ENCOUNTER — Encounter: Payer: Self-pay | Admitting: Internal Medicine

## 2019-02-24 DIAGNOSIS — D72829 Elevated white blood cell count, unspecified: Secondary | ICD-10-CM

## 2019-02-24 DIAGNOSIS — R7989 Other specified abnormal findings of blood chemistry: Secondary | ICD-10-CM

## 2019-02-24 DIAGNOSIS — R945 Abnormal results of liver function studies: Secondary | ICD-10-CM

## 2019-02-24 LAB — URINALYSIS, ROUTINE W REFLEX MICROSCOPIC
Bilirubin Urine: NEGATIVE
Ketones, ur: NEGATIVE
Leukocytes,Ua: NEGATIVE
Nitrite: NEGATIVE
Specific Gravity, Urine: <=1.005 — AB (ref 1.000–1.030)
Total Protein, Urine: NEGATIVE
UROBILINOGEN UA: 0.2 (ref 0.0–1.0)
Urine Glucose: NEGATIVE
WBC, UA: NONE SEEN (ref 0–?)
pH: 6 (ref 5.0–8.0)

## 2019-02-24 MED ORDER — SERTRALINE HCL 25 MG PO TABS: 25.0000 mg | ORAL_TABLET | Freq: Every day | ORAL | 1 refills | Status: DC

## 2019-02-24 NOTE — Progress Notes (Signed)
F/u labs ordered.   

## 2019-02-24 NOTE — Telephone Encounter (Signed)
rx sent in for zoloft 25mg  #30 with one refill.

## 2019-02-26 ENCOUNTER — Encounter: Payer: Self-pay | Admitting: Internal Medicine

## 2019-02-26 DIAGNOSIS — Z0000 Encounter for general adult medical examination without abnormal findings: Secondary | ICD-10-CM | POA: Insufficient documentation

## 2019-02-26 DIAGNOSIS — F439 Reaction to severe stress, unspecified: Secondary | ICD-10-CM | POA: Insufficient documentation

## 2019-02-26 NOTE — Assessment & Plan Note (Signed)
Increased stress as outlined.  Discussed her increased anxiety.  Stress about her abdominal pain and everything going on with sickness, etc.  Discussed SSRI. She wants to think about this and will call me if desires to start.  Refilled xanax to have if needed.   Addendum:  rx sent in for zoloft.  Pt called back wanting to stop.

## 2019-02-26 NOTE — Assessment & Plan Note (Signed)
Physical today 02/23/19.  Mammogram 03/09/18 - Birads I.  Schedule f/u mammogram.  cologuard 10/17/18 - negative.

## 2019-02-26 NOTE — Assessment & Plan Note (Signed)
Persistent LLQ pain.  No known triggers.  Bowels unchanged.  Eating.  No nausea or vomiting.  Previous colonoscopy - diverticulosis.  Check routine labs.  Also check CT scan.

## 2019-03-03 ENCOUNTER — Other Ambulatory Visit (INDEPENDENT_AMBULATORY_CARE_PROVIDER_SITE_OTHER): Payer: Medicare Other

## 2019-03-03 ENCOUNTER — Encounter: Payer: Self-pay | Admitting: Internal Medicine

## 2019-03-03 ENCOUNTER — Other Ambulatory Visit: Payer: Self-pay

## 2019-03-03 DIAGNOSIS — R945 Abnormal results of liver function studies: Secondary | ICD-10-CM

## 2019-03-03 DIAGNOSIS — R7989 Other specified abnormal findings of blood chemistry: Secondary | ICD-10-CM

## 2019-03-03 DIAGNOSIS — D72829 Elevated white blood cell count, unspecified: Secondary | ICD-10-CM

## 2019-03-03 LAB — CBC WITH DIFFERENTIAL/PLATELET
Basophils Absolute: 0.1 10*3/uL (ref 0.0–0.1)
Basophils Relative: 0.6 % (ref 0.0–3.0)
EOS ABS: 0.1 10*3/uL (ref 0.0–0.7)
Eosinophils Relative: 1.2 % (ref 0.0–5.0)
HCT: 44 % (ref 36.0–46.0)
Hemoglobin: 14.9 g/dL (ref 12.0–15.0)
Lymphocytes Relative: 22.5 % (ref 12.0–46.0)
Lymphs Abs: 2.1 10*3/uL (ref 0.7–4.0)
MCHC: 33.8 g/dL (ref 30.0–36.0)
MCV: 94.2 fl (ref 78.0–100.0)
Monocytes Absolute: 0.9 10*3/uL (ref 0.1–1.0)
Monocytes Relative: 9.3 % (ref 3.0–12.0)
Neutro Abs: 6.2 10*3/uL (ref 1.4–7.7)
Neutrophils Relative %: 66.4 % (ref 43.0–77.0)
Platelets: 237 10*3/uL (ref 150.0–400.0)
RBC: 4.68 Mil/uL (ref 3.87–5.11)
RDW: 14.5 % (ref 11.5–15.5)
WBC: 9.4 10*3/uL (ref 4.0–10.5)

## 2019-03-03 LAB — HEPATIC FUNCTION PANEL
ALT: 34 U/L (ref 0–35)
AST: 21 U/L (ref 0–37)
Albumin: 4.4 g/dL (ref 3.5–5.2)
Alkaline Phosphatase: 72 U/L (ref 39–117)
Bilirubin, Direct: 0.1 mg/dL (ref 0.0–0.3)
Total Bilirubin: 0.5 mg/dL (ref 0.2–1.2)
Total Protein: 7.5 g/dL (ref 6.0–8.3)

## 2019-03-05 ENCOUNTER — Encounter: Payer: Self-pay | Admitting: Internal Medicine

## 2019-03-06 ENCOUNTER — Telehealth: Payer: Self-pay

## 2019-03-06 NOTE — Telephone Encounter (Signed)
Copied from [place] [id number]. Topic: Appointment Scheduling - Scheduling Inquiry for Clinic >> Mar 06, 2019 10:56 AM Burchel, Abbi R wrote: Reason for CRM:   Pt returned call to resch appt on 04/06/2019. No available appts before Aug/2020 Please call pt to resch.

## 2019-03-06 NOTE — Telephone Encounter (Signed)
The patient has been rescheduled . She is aware of her appointment.

## 2019-03-06 NOTE — Telephone Encounter (Signed)
Can we move pt up?

## 2019-03-10 ENCOUNTER — Ambulatory Visit
Admission: RE | Admit: 2019-03-10 | Discharge: 2019-03-10 | Disposition: A | Discharge status: Home / Self Care | Payer: Medicare Other | Source: Ambulatory Visit | Attending: Internal Medicine | Admitting: Internal Medicine

## 2019-03-10 ENCOUNTER — Other Ambulatory Visit: Payer: Self-pay

## 2019-03-10 DIAGNOSIS — R1084 Generalized abdominal pain: Secondary | ICD-10-CM | POA: Diagnosis not present

## 2019-03-10 MED ORDER — IOHEXOL 300 MG/ML  SOLN: 100.0000 mL | Freq: Once | INTRAMUSCULAR | Status: DC | PRN

## 2019-03-10 MED ORDER — IOHEXOL 300 MG/ML  SOLN
75.0000 mL | Freq: Once | INTRAMUSCULAR | Status: AC | PRN
  Administered 2019-03-10: 09:00:00 75 mL via INTRAVENOUS

## 2019-03-14 ENCOUNTER — Other Ambulatory Visit: Payer: Self-pay | Admitting: Internal Medicine

## 2019-03-14 NOTE — Progress Notes (Signed)
Opened in error

## 2019-03-15 ENCOUNTER — Other Ambulatory Visit: Payer: Self-pay | Admitting: Internal Medicine

## 2019-03-15 ENCOUNTER — Encounter: Payer: Self-pay | Admitting: Internal Medicine

## 2019-03-15 DIAGNOSIS — E278 Other specified disorders of adrenal gland: Secondary | ICD-10-CM

## 2019-03-15 DIAGNOSIS — E279 Disorder of adrenal gland, unspecified: Principal | ICD-10-CM

## 2019-03-15 NOTE — Progress Notes (Signed)
Order placed for MRI abdomen with adrenal protocol.

## 2019-03-15 NOTE — Telephone Encounter (Signed)
Message sent to Dr Lucky Cowboy.  MRI abdomen with adrenal protocol ordered.

## 2019-03-19 ENCOUNTER — Telehealth: Payer: Self-pay | Admitting: Internal Medicine

## 2019-03-19 ENCOUNTER — Other Ambulatory Visit: Payer: Self-pay | Admitting: Internal Medicine

## 2019-03-19 NOTE — Telephone Encounter (Signed)
-----   Message from Algernon Huxley, MD sent at 03/16/2019  8:16 AM EDT ----- Regarding: RE: question I would probably do some ABIs and gauge her for claudication symptoms.  Doubt that is the cause of her abdominal pain but it is something we will need to monitor.  ----- Message ----- From: Einar Pheasant, MD Sent: 03/15/2019   9:03 PM EDT To: Algernon Huxley, MD Subject: question                                       If you do not mind, I would like to ask you a question about one of my patients.  I saw Toni Gardner for a routine appointment.  She was having persistent abdominal discomfort (localized left lower quadrant pain).  CT performed and I am planning to f/u regarding her adrenal nodule.  The radiologist commented on calcified plaque of the aorta with estimated 50% narrowing.  I am going to discuss cholesterol treatment, etc.  Is there anything else you would recommend given the advanced atherosclerosis?  Thank you for your help.  Hope you are doing well.    Einar Pheasant

## 2019-03-21 ENCOUNTER — Ambulatory Visit (INDEPENDENT_AMBULATORY_CARE_PROVIDER_SITE_OTHER): Payer: Medicare Other | Admitting: Internal Medicine

## 2019-03-21 ENCOUNTER — Encounter: Payer: Self-pay | Admitting: Internal Medicine

## 2019-03-21 DIAGNOSIS — I700 Atherosclerosis of aorta: Secondary | ICD-10-CM | POA: Diagnosis not present

## 2019-03-21 DIAGNOSIS — R1032 Left lower quadrant pain: Secondary | ICD-10-CM

## 2019-03-21 DIAGNOSIS — F439 Reaction to severe stress, unspecified: Secondary | ICD-10-CM

## 2019-03-21 DIAGNOSIS — E279 Disorder of adrenal gland, unspecified: Secondary | ICD-10-CM

## 2019-03-21 DIAGNOSIS — E278 Other specified disorders of adrenal gland: Secondary | ICD-10-CM

## 2019-03-21 NOTE — Progress Notes (Addendum)
Patient ID: Toni Gardner, female   DOB: August 10, 1951, 68 y.o.   MRN: [id number] Virtual Visit via Video: Note  This visit type was conducted due to national recommendations for restrictions regarding the COVID-19 pandemic (e.g. social distancing).  This format is felt to be most appropriate for this patient at this time.  All issues noted in this document were discussed and addressed.  No physical exam was performed (except for noted visual exam findings with Video Visits).   I connected with Tashae Inda on 03/21/19 at  2:30 PM EDT by a video enabled telemedicine application.  Verified that I am speaking with the correct person using two identifiers. Location patient: home Location provider: work  Persons participating in the virtual visit: patient, provider  I discussed the limitations, risks, security and privacy concerns of performing an evaluation and management service by video.  The patient expressed understanding and agreed to proceed.   Reason for visit: scheduled follow up.   HPI: She reports increased stress recently.  Increased stress related to her CT scan results, corona virus pandemic, etc.  Discussed with her today.  She is staying in.  No known COVID exposure.  No fever.  No cough, congestion or sob.  No acid reflux reported.  No chest pain.  Eating.  Evaluated recently for abdominal pain.  See last note.  Had CT abdomen and pelvis.  Again discussed CT results including indeterminate left adrenal nodule (55mm).  Scheduled for f/u MRI with adrenal protocol.  Discussed with her today regarding atherosclerotic changes noted on CT.  Discussed vascular surgery referral.  Dr Lucky Cowboy reviewed scan.  Recommend ABIs, etc.  She is agreeable to schedule an appt with vascular surgery for further evaluation.  She denies any leg pain or cramping with increased activity and exertion.  Bowels moving.  Abdominal discomfort - stable.  Discussed starting cholesterol medication given atherosclerosis.  She is  agreeable.     ROS: See pertinent positives and negatives per HPI.  Past Medical History:  Diagnosis Date  . Colon polyp   . Urine incontinence     Past Surgical History:  Procedure Laterality Date  . ABDOMINAL HYSTERECTOMY    . ADENOIDECTOMY    . COLONOSCOPY  06/28/2008   Dr Bary Castilla    Family History  Problem Relation Age of Onset  . Heart disease Father   . Breast cancer Maternal Aunt   . Cancer Maternal Uncle        colon  . Colon polyps Sister     SOCIAL HX: reviewed.    Current Outpatient Medications:  .  ALPRAZolam (XANAX) 0.25 MG tablet, 1/2 tablet q day prn, Disp: 30 tablet, Rfl: 1 .  cholecalciferol (VITAMIN D) 1000 units tablet, Take 1,000 Units by mouth daily., Disp: , Rfl:  .  fluticasone (FLONASE) 50 MCG/ACT nasal spray, Place 2 sprays daily into both nostrils., Disp: 16 g, Rfl: 6 .  Probiotic Product (ALIGN) 4 MG CAPS, Take by mouth daily., Disp: , Rfl:  .  rosuvastatin (CRESTOR) 10 MG tablet, Take 1 tablet (10 mg total) by mouth daily., Disp: 30 tablet, Rfl: 2 .  sertraline (ZOLOFT) 25 MG tablet, TAKE 1 TABLET BY MOUTH EVERY DAY, Disp: 90 tablet, Rfl: 1  EXAM:  GENERAL: alert, oriented, appears well and in no acute distress  HEENT: atraumatic, conjunttiva clear, no obvious abnormalities on inspection of external nose and ears  NECK: normal movements of the head and neck  LUNGS: on inspection no signs of respiratory distress,  breathing rate appears normal, no obvious gross SOB, gasping or wheezing  CV: no obvious cyanosis  PSYCH/NEURO: pleasant and cooperative, no obvious depression or anxiety, speech and thought processing grossly intact  ASSESSMENT AND PLAN:  Discussed the following assessment and plan:  Left lower quadrant abdominal pain  Stress  Aortic atherosclerosis ([place]) - Plan: Ambulatory referral to Vascular Surgery  Adrenal nodule ([facility])  Abdominal pain Persistent discomfort.  Varies.  No significant pain.  No known triggers.   Bowels stable.  Eating.  No vomiting.  CT as outlined.  No acute abnormality.  Adrenal nodule present.  Scheduled for f/u MRI with adrenal protocol.  Atherosclerotic changes noted on CT.  Discussed with her today regarding referral to vascular surgery for further evaluation and w/up (including ABIs, etc).  She is in agreement.  Also start crestor.    Stress Increased stress.  Discussed with her today.  Recently started on zoloft.  Follow.  She does not feel needs anything more at this time.    Aortic atherosclerosis ([place]) Discussed recent findings on CT.  Start crestor.  Refer to vascular surgery as outlined.  No claudication symptoms.   Adrenal nodule ([facility]) Adrenal nodule found on CT as outlined.  Scheduled for MRI with adrenal protocol.      I discussed the assessment and treatment plan with the patient. The patient was provided an opportunity to ask questions and all were answered. The patient agreed with the plan and demonstrated an understanding of the instructions.   The patient was advised to call back or seek an in-person evaluation if the symptoms worsen or if the condition fails to improve as anticipated.    Einar Pheasant, MD

## 2019-03-22 ENCOUNTER — Encounter: Payer: Self-pay | Admitting: Internal Medicine

## 2019-03-22 DIAGNOSIS — I700 Atherosclerosis of aorta: Secondary | ICD-10-CM | POA: Insufficient documentation

## 2019-03-22 DIAGNOSIS — E278 Other specified disorders of adrenal gland: Secondary | ICD-10-CM | POA: Insufficient documentation

## 2019-03-22 DIAGNOSIS — E279 Disorder of adrenal gland, unspecified: Secondary | ICD-10-CM

## 2019-03-22 MED ORDER — ROSUVASTATIN CALCIUM 10 MG PO TABS: 10.0000 mg | ORAL_TABLET | Freq: Every day | ORAL | 2 refills | Status: DC

## 2019-03-22 NOTE — Assessment & Plan Note (Signed)
Increased stress.  Discussed with her today.  Recently started on zoloft.  Follow.  She does not feel needs anything more at this time.

## 2019-03-22 NOTE — Assessment & Plan Note (Signed)
Adrenal nodule found on CT as outlined.  Scheduled for MRI with adrenal protocol.

## 2019-03-22 NOTE — Assessment & Plan Note (Addendum)
Discussed recent findings on CT.  Start crestor.  Refer to vascular surgery as outlined.  No claudication symptoms.

## 2019-03-22 NOTE — Assessment & Plan Note (Signed)
Persistent discomfort.  Varies.  No significant pain.  No known triggers.  Bowels stable.  Eating.  No vomiting.  CT as outlined.  No acute abnormality.  Adrenal nodule present.  Scheduled for f/u MRI with adrenal protocol.  Atherosclerotic changes noted on CT.  Discussed with her today regarding referral to vascular surgery for further evaluation and w/up (including ABIs, etc).  She is in agreement.  Also start crestor.

## 2019-03-23 ENCOUNTER — Ambulatory Visit: Payer: Medicare Other | Admitting: Internal Medicine

## 2019-04-11 ENCOUNTER — Encounter (INDEPENDENT_AMBULATORY_CARE_PROVIDER_SITE_OTHER): Payer: Medicare Other | Admitting: Vascular Surgery

## 2019-04-17 ENCOUNTER — Ambulatory Visit
Admission: RE | Admit: 2019-04-17 | Discharge: 2019-04-17 | Disposition: A | Discharge status: Home / Self Care | Payer: Medicare Other | Source: Ambulatory Visit | Attending: Internal Medicine | Admitting: Internal Medicine

## 2019-04-17 ENCOUNTER — Other Ambulatory Visit (INDEPENDENT_AMBULATORY_CARE_PROVIDER_SITE_OTHER): Payer: Self-pay | Admitting: Vascular Surgery

## 2019-04-17 ENCOUNTER — Other Ambulatory Visit: Payer: Self-pay | Admitting: Internal Medicine

## 2019-04-17 ENCOUNTER — Other Ambulatory Visit: Payer: Self-pay

## 2019-04-17 DIAGNOSIS — E278 Other specified disorders of adrenal gland: Secondary | ICD-10-CM

## 2019-04-17 DIAGNOSIS — E279 Disorder of adrenal gland, unspecified: Secondary | ICD-10-CM | POA: Diagnosis present

## 2019-04-17 DIAGNOSIS — I700 Atherosclerosis of aorta: Secondary | ICD-10-CM

## 2019-04-17 LAB — POCT I-STAT CREATININE: Creatinine, Ser: 0.7 mg/dL (ref 0.44–1.00)

## 2019-04-17 MED ORDER — GADOBUTROL 1 MMOL/ML IV SOLN
4.0000 mL | Freq: Once | INTRAVENOUS | Status: AC | PRN
  Administered 2019-04-17: 10:00:00 4 mL via INTRAVENOUS

## 2019-04-18 ENCOUNTER — Ambulatory Visit (INDEPENDENT_AMBULATORY_CARE_PROVIDER_SITE_OTHER): Payer: Medicare Other

## 2019-04-18 ENCOUNTER — Ambulatory Visit (INDEPENDENT_AMBULATORY_CARE_PROVIDER_SITE_OTHER): Payer: Medicare Other | Admitting: Vascular Surgery

## 2019-04-18 ENCOUNTER — Encounter (INDEPENDENT_AMBULATORY_CARE_PROVIDER_SITE_OTHER): Payer: Self-pay | Admitting: Vascular Surgery

## 2019-04-18 ENCOUNTER — Other Ambulatory Visit: Payer: Self-pay

## 2019-04-18 VITALS — BP 121/73 | HR 88 | Resp 10 | Ht 60.0 in | Wt 101.0 lb

## 2019-04-18 DIAGNOSIS — R1032 Left lower quadrant pain: Secondary | ICD-10-CM | POA: Diagnosis not present

## 2019-04-18 DIAGNOSIS — I700 Atherosclerosis of aorta: Secondary | ICD-10-CM | POA: Diagnosis not present

## 2019-04-18 DIAGNOSIS — E785 Hyperlipidemia, unspecified: Secondary | ICD-10-CM | POA: Diagnosis not present

## 2019-04-18 NOTE — Patient Instructions (Signed)
Peripheral Vascular Disease  Peripheral vascular disease (PVD) is a disease of the blood vessels that are not part of your heart and brain. A simple term for PVD is poor circulation. In most cases, PVD narrows the blood vessels that carry blood from your heart to the rest of your body. This can reduce the supply of blood to your arms, legs, and internal organs, like your stomach or kidneys. However, PVD most often affects a person's lower legs and feet. Without treatment, PVD tends to get worse. PVD can also lead to acute ischemic limb. This is when an arm or leg suddenly cannot get enough blood. This is a medical emergency. Follow these instructions at home: Lifestyle  Do not use any products that contain nicotine or tobacco, such as cigarettes and e-cigarettes. If you need help quitting, ask your doctor.  Lose weight if you are overweight. Or, stay at a healthy weight as told by your doctor.  Eat a diet that is low in fat and cholesterol. If you need help, ask your doctor.  Exercise regularly. Ask your doctor for activities that are right for you. General instructions  Take over-the-counter and prescription medicines only as told by your doctor.  Take good care of your feet: ? Wear comfortable shoes that fit well. ? Check your feet often for any cuts or sores.  Keep all follow-up visits as told by your doctor This is important. Contact a doctor if:  You have cramps in your legs when you walk.  You have leg pain when you are at rest.  You have coldness in a leg or foot.  Your skin changes.  You are unable to get or have an erection (erectile dysfunction).  You have cuts or sores on your feet that do not heal. Get help right away if:  Your arm or leg turns cold, numb, and blue.  Your arms or legs become red, warm, swollen, painful, or numb.  You have chest pain.  You have trouble breathing.  You suddenly have weakness in your face, arm, or leg.  You become very  confused or you cannot speak.  You suddenly have a very bad headache.  You suddenly cannot see. Summary  Peripheral vascular disease (PVD) is a disease of the blood vessels.  A simple term for PVD is poor circulation. Without treatment, PVD tends to get worse.  Treatment may include exercise, low fat and low cholesterol diet, and quitting smoking. This information is not intended to replace advice given to you by your health care provider. Make sure you discuss any questions you have with your health care provider. Document Released: 02/17/2010 Document Revised: 12/31/2016 Document Reviewed: 12/31/2016 Elsevier Interactive Patient Education  2019 Elsevier Inc.  

## 2019-04-18 NOTE — Assessment & Plan Note (Signed)
lipid control important in reducing the progression of atherosclerotic disease. Continue statin therapy  

## 2019-04-18 NOTE — Assessment & Plan Note (Signed)
We performed ABIs today which showed normal triphasic waveforms with a right ABI of 0.95 and a left ABI of 0.99.  Her toe pressures are mildly reduced but her digital waveforms are quite good.  At this point, no role for intervention.  Would recommend continuing her statin agent and a baby aspirin daily.  Would recheck in 1 year with noninvasive studies or sooner if problems develop in the interim.

## 2019-04-18 NOTE — Assessment & Plan Note (Signed)
Better now.  Was the reason for the CT

## 2019-04-18 NOTE — Progress Notes (Signed)
Patient ID: Toni Gardner, female   DOB: Feb 11, 1951, 68 y.o.   MRN: [id number]  Chief Complaint  Patient presents with  . New Patient (Initial Visit)    HPI [name] is a 68 y.o. female.  I am asked to see the patient by Dr. Nicki Reaper for evaluation of aortoiliac atherosclerosis.  The patient reports no clear or obvious symptoms of atherosclerotic peripheral arterial disease.  Specifically, she denies claudication, ischemic rest pain, or ulceration.  Her aortoiliac atherosclerosis was seen on a CT scan which I have independently reviewed that was performed about 6 weeks ago for abdominal pain.  This scan does demonstrate fairly significant aortoiliac atherosclerosis.  This was not done as a CT angiogram and it is impossible to tell how much stenosis was present as part of this atherosclerotic change.  We performed ABIs today which showed normal triphasic waveforms with a right ABI of 0.95 and a left ABI of 0.99.  Her toe pressures are mildly reduced but her digital waveforms are quite good.     Past Medical History:  Diagnosis Date  . Colon polyp   . Urine incontinence     Past Surgical History:  Procedure Laterality Date  . ABDOMINAL HYSTERECTOMY    . ADENOIDECTOMY    . COLONOSCOPY  06/28/2008   Dr Bary Castilla    Family History Family History  Problem Relation Age of Onset  . Heart disease Father   . Breast cancer Maternal Aunt   . Cancer Maternal Uncle        colon  . Colon polyps Sister     Social History Social History   Tobacco Use  . Smoking status: Current Some Day Smoker    Types: Cigarettes  . Smokeless tobacco: Never Used  . Tobacco comment: 4 cigarettes per day  Substance Use Topics  . Alcohol use: Yes  . Drug use: No    Allergies  Allergen Reactions  . Sulfa Antibiotics Swelling    Current Outpatient Medications  Medication Sig Dispense Refill  . ALPRAZolam (XANAX) 0.25 MG tablet 1/2 tablet q day prn 30 tablet 1  . cholecalciferol (VITAMIN D) 1000  units tablet Take 1,000 Units by mouth daily.    . fluticasone (FLONASE) 50 MCG/ACT nasal spray Place 2 sprays daily into both nostrils. 16 g 6  . rosuvastatin (CRESTOR) 10 MG tablet Take 1 tablet (10 mg total) by mouth daily. 30 tablet 2  . sertraline (ZOLOFT) 25 MG tablet TAKE 1 TABLET BY MOUTH EVERY DAY 90 tablet 1  . Probiotic Product (ALIGN) 4 MG CAPS Take by mouth daily.     No current facility-administered medications for this visit.       REVIEW OF SYSTEMS (Negative unless checked)  Constitutional: [] Weight loss  [] Fever  [] Chills Cardiac: [] Chest pain   [] Chest pressure   [] Palpitations   [] Shortness of breath when laying flat   [] Shortness of breath at rest   [] Shortness of breath with exertion. Vascular:  [] Pain in legs with walking   [] Pain in legs at rest   [] Pain in legs when laying flat   [] Claudication   [] Pain in feet when walking  [] Pain in feet at rest  [] Pain in feet when laying flat   [] History of DVT   [] Phlebitis   [] Swelling in legs   [] Varicose veins   [] Non-healing ulcers Pulmonary:   [] Uses home oxygen   [] Productive cough   [] Hemoptysis   [] Wheeze  [] COPD   [] Asthma Neurologic:  [] Dizziness  []   Blackouts   [] Seizures   [] History of stroke   [] History of TIA  [] Aphasia   [] Temporary blindness   [] Dysphagia   [] Weakness or numbness in arms   [] Weakness or numbness in legs Musculoskeletal:  [x] Arthritis   [] Joint swelling   [] Joint pain   [] Low back pain Hematologic:  [] Easy bruising  [] Easy bleeding   [] Hypercoagulable state   [] Anemic  [] Hepatitis Gastrointestinal:  [] Blood in stool   [] Vomiting blood  [] Gastroesophageal reflux/heartburn   [x] Abdominal pain Genitourinary:  [] Chronic kidney disease   [x] Difficult urination  [] Frequent urination  [] Burning with urination   [] Hematuria Skin:  [] Rashes   [] Ulcers   [] Wounds Psychological:  [] History of anxiety   []  History of major depression.    Physical Exam BP 121/73 (BP Location: Left Arm, Patient Position:  Sitting, Cuff Size: Small)   Pulse 88   Resp 10   Ht 5' (1.524 m)   Wt 101 lb (45.8 kg)   BMI 19.73 kg/m  Gen:  WD/WN, NAD. Appears younger than stated age. Head: [place]/AT, No temporalis wasting.  Ear/Nose/Throat: Hearing grossly intact, nares w/o erythema or drainage, oropharynx w/o Erythema/Exudate Eyes: Conjunctiva clear, sclera non-icteric  Neck: trachea midline.  No JVD.  Pulmonary:  Good air movement, respirations not labored, no use of accessory muscles  Cardiac: RRR, no JVD Vascular:  Vessel Right Left  Radial Palpable Palpable                          DP 2+ 2+  PT 2+ 2+   Gastrointestinal:. No masses, surgical incisions, or scars. Musculoskeletal: M/S 5/5 throughout.  Extremities without ischemic changes.  No deformity or atrophy. No edema. Neurologic: Sensation grossly intact in extremities.  Symmetrical.  Speech is fluent. Motor exam as listed above. Psychiatric: Judgment intact, Mood & affect appropriate for pt's clinical situation. Dermatologic: No rashes or ulcers noted.  No cellulitis or open wounds.    Radiology Mr Abdomen Wwo Contrast  Result Date: 04/17/2019 CLINICAL DATA:  Indeterminate left adrenal mass on recent CT. EXAM: MRI ABDOMEN WITHOUT AND WITH CONTRAST TECHNIQUE: Multiplanar multisequence MR imaging of the abdomen was performed both before and after the administration of intravenous contrast. CONTRAST:  4 mL Gadavist COMPARISON:  CT on 03/10/2019 FINDINGS: Lower chest: No acute findings. Hepatobiliary: No hepatic masses identified. Gallbladder is unremarkable. No evidence of biliary ductal dilatation. Pancreas:  No mass or inflammatory changes. Spleen:  Within normal limits in size and appearance. Adrenals/Urinary Tract: Small bilobed left adrenal mass shows signal dropout on chemical shift imaging, consistent with a benign adrenal adenoma. The right adrenal gland is normal in appearance. A horseshoe kidney is noted. A few tiny sub-cm renal cysts are  noted bilaterally, however there is no evidence of renal mass or hydronephrosis. Stomach/Bowel: Visualized portion unremarkable. Vascular/Lymphatic: No pathologically enlarged lymph nodes identified. No abdominal aortic aneurysm. Other:  None. Musculoskeletal:  No suspicious bone lesions identified. IMPRESSION: Small benign left adrenal adenoma, corresponding to mass seen on recent CT. Horseshoe kidney incidentally noted. Electronically Signed   By: Earle Gell M.D.   On: 04/17/2019 11:58    Labs Recent Results (from the past 2160 hour(s))  CBC with Differential/Platelet     Status: Abnormal   Collection Time: 02/23/19  2:18 PM  Result Value Ref Range   WBC 13.8 (H) 4.0 - 10.5 K/uL   RBC 4.70 3.87 - 5.11 Mil/uL   Hemoglobin 14.8 12.0 - 15.0 g/dL   HCT 44.0  36.0 - 46.0 %   MCV 93.6 78.0 - 100.0 fl   MCHC 33.5 30.0 - 36.0 g/dL   RDW 14.5 11.5 - 15.5 %   Platelets 278.0 150.0 - 400.0 K/uL   Neutrophils Relative % 73.9 43.0 - 77.0 %   Lymphocytes Relative 19.2 12.0 - 46.0 %   Monocytes Relative 6.3 3.0 - 12.0 %   Eosinophils Relative 0.3 0.0 - 5.0 %   Basophils Relative 0.3 0.0 - 3.0 %   Neutro Abs 10.2 (H) 1.4 - 7.7 K/uL   Lymphs Abs 2.7 0.7 - 4.0 K/uL   Monocytes Absolute 0.9 0.1 - 1.0 K/uL   Eosinophils Absolute 0.0 0.0 - 0.7 K/uL   Basophils Absolute 0.0 0.0 - 0.1 K/uL  Hepatic function panel     Status: Abnormal   Collection Time: 02/23/19  2:18 PM  Result Value Ref Range   Total Bilirubin 0.3 0.2 - 1.2 mg/dL   Bilirubin, Direct 0.0 0.0 - 0.3 mg/dL   Alkaline Phosphatase 80 39 - 117 U/L   AST 24 0 - 37 U/L   ALT 36 (H) 0 - 35 U/L   Total Protein 7.7 6.0 - 8.3 g/dL   Albumin 4.4 3.5 - 5.2 g/dL  Lipid panel     Status: Abnormal   Collection Time: 02/23/19  2:18 PM  Result Value Ref Range   Cholesterol 229 (H) 0 - 200 mg/dL    Comment: ATP III Classification       Desirable:  < 200 mg/dL               Borderline High:  200 - 239 mg/dL          High:  > = 240 mg/dL    Triglycerides 71.0 0.0 - 149.0 mg/dL    Comment: Normal:  <150 mg/dLBorderline High:  150 - 199 mg/dL   HDL 103.10 >39.00 mg/dL   VLDL 14.2 0.0 - 40.0 mg/dL   LDL Cholesterol 112 (H) 0 - 99 mg/dL   Total CHOL/HDL Ratio 2     Comment:                Men          Women1/2 Average Risk     3.4          3.3Average Risk          5.0          4.42X Average Risk          9.6          7.13X Average Risk          15.0          11.0                       NonHDL 125.95     Comment: NOTE:  Non-HDL goal should be 30 mg/dL higher than patient's LDL goal (i.e. LDL goal of < 70 mg/dL, would have non-HDL goal of < 100 mg/dL)  TSH     Status: None   Collection Time: 02/23/19  2:18 PM  Result Value Ref Range   TSH 0.82 0.35 - 4.50 uIU/mL  Basic metabolic panel     Status: Abnormal   Collection Time: 02/23/19  2:18 PM  Result Value Ref Range   Sodium 136 135 - 145 mEq/L   Potassium 4.7 3.5 - 5.1 mEq/L   Chloride 101 96 - 112 mEq/L   CO2 28  19 - 32 mEq/L   Glucose, Bld 108 (H) 70 - 99 mg/dL   BUN 11 6 - 23 mg/dL   Creatinine, Ser 0.80 0.40 - 1.20 mg/dL   Calcium 10.0 8.4 - 10.5 mg/dL   GFR 71.01 >60.00 mL/min  Urinalysis, Routine w reflex microscopic     Status: Abnormal   Collection Time: 02/23/19  2:18 PM  Result Value Ref Range   Color, Urine YELLOW Yellow;Lt. Yellow;Straw;Dark Yellow;Amber;Green;Red;Brown   APPearance CLEAR Clear;Turbid;Slightly Cloudy;Cloudy   Specific Gravity, Urine <=1.005 (A) 1.000 - 1.030   pH 6.0 5.0 - 8.0   Total Protein, Urine NEGATIVE Negative   Urine Glucose NEGATIVE Negative   Ketones, ur NEGATIVE Negative   Bilirubin Urine NEGATIVE Negative   Hgb urine dipstick TRACE-INTACT (A) Negative   Urobilinogen, UA 0.2 0.0 - 1.0   Leukocytes,Ua NEGATIVE Negative   Nitrite NEGATIVE Negative   WBC, UA none seen 0-2/hpf   RBC / HPF 0-2/hpf 0-2/hpf   Squamous Epithelial / LPF Rare(0-4/hpf) Rare(0-4/hpf)  Hepatic function panel     Status: None   Collection Time: 03/03/19  10:07 AM  Result Value Ref Range   Total Bilirubin 0.5 0.2 - 1.2 mg/dL   Bilirubin, Direct 0.1 0.0 - 0.3 mg/dL   Alkaline Phosphatase 72 39 - 117 U/L   AST 21 0 - 37 U/L   ALT 34 0 - 35 U/L   Total Protein 7.5 6.0 - 8.3 g/dL   Albumin 4.4 3.5 - 5.2 g/dL  CBC with Differential/Platelet     Status: None   Collection Time: 03/03/19 10:07 AM  Result Value Ref Range   WBC 9.4 4.0 - 10.5 K/uL   RBC 4.68 3.87 - 5.11 Mil/uL   Hemoglobin 14.9 12.0 - 15.0 g/dL   HCT 44.0 36.0 - 46.0 %   MCV 94.2 78.0 - 100.0 fl   MCHC 33.8 30.0 - 36.0 g/dL   RDW 14.5 11.5 - 15.5 %   Platelets 237.0 150.0 - 400.0 K/uL   Neutrophils Relative % 66.4 43.0 - 77.0 %   Lymphocytes Relative 22.5 12.0 - 46.0 %   Monocytes Relative 9.3 3.0 - 12.0 %   Eosinophils Relative 1.2 0.0 - 5.0 %   Basophils Relative 0.6 0.0 - 3.0 %   Neutro Abs 6.2 1.4 - 7.7 K/uL   Lymphs Abs 2.1 0.7 - 4.0 K/uL   Monocytes Absolute 0.9 0.1 - 1.0 K/uL   Eosinophils Absolute 0.1 0.0 - 0.7 K/uL   Basophils Absolute 0.1 0.0 - 0.1 K/uL  I-STAT creatinine     Status: None   Collection Time: 04/17/19  9:12 AM  Result Value Ref Range   Creatinine, Ser 0.70 0.44 - 1.00 mg/dL    Assessment/Plan:  Abdominal pain Better now.  Was the reason for the CT  Hyperlipidemia lipid control important in reducing the progression of atherosclerotic disease. Continue statin therapy   Aortic atherosclerosis ([facility]) We performed ABIs today which showed normal triphasic waveforms with a right ABI of 0.95 and a left ABI of 0.99.  Her toe pressures are mildly reduced but her digital waveforms are quite good.  At this point, no role for intervention.  Would recommend continuing her statin agent and a baby aspirin daily.  Would recheck in 1 year with noninvasive studies or sooner if problems develop in the interim.      Leotis Pain 04/18/2019, 2:04 PM   This note was created with Dragon medical transcription system.  Any errors from dictation are  unintentional.

## 2019-04-19 ENCOUNTER — Other Ambulatory Visit: Payer: Self-pay | Admitting: Internal Medicine

## 2019-04-19 DIAGNOSIS — D3500 Benign neoplasm of unspecified adrenal gland: Secondary | ICD-10-CM

## 2019-04-19 NOTE — Progress Notes (Signed)
Order placed for endocrinology referral.  

## 2019-05-23 ENCOUNTER — Ambulatory Visit
Admission: RE | Admit: 2019-05-23 | Discharge: 2019-05-23 | Disposition: A | Discharge status: Home / Self Care | Payer: Medicare Other | Source: Ambulatory Visit | Attending: Internal Medicine | Admitting: Internal Medicine

## 2019-05-23 ENCOUNTER — Other Ambulatory Visit: Payer: Self-pay

## 2019-05-23 DIAGNOSIS — Z1231 Encounter for screening mammogram for malignant neoplasm of breast: Secondary | ICD-10-CM | POA: Diagnosis not present

## 2019-05-23 DIAGNOSIS — Z1239 Encounter for other screening for malignant neoplasm of breast: Secondary | ICD-10-CM

## 2019-05-26 ENCOUNTER — Telehealth: Payer: Self-pay | Admitting: *Deleted

## 2019-05-26 DIAGNOSIS — E785 Hyperlipidemia, unspecified: Secondary | ICD-10-CM

## 2019-05-26 NOTE — Telephone Encounter (Signed)
Please place future orders for lab appt.  

## 2019-05-27 NOTE — Telephone Encounter (Signed)
Order placed for fasting labs.   

## 2019-05-30 ENCOUNTER — Other Ambulatory Visit (INDEPENDENT_AMBULATORY_CARE_PROVIDER_SITE_OTHER): Payer: Medicare Other

## 2019-05-30 ENCOUNTER — Other Ambulatory Visit: Payer: Self-pay

## 2019-05-30 ENCOUNTER — Encounter: Payer: Self-pay | Admitting: Internal Medicine

## 2019-05-30 DIAGNOSIS — E785 Hyperlipidemia, unspecified: Secondary | ICD-10-CM

## 2019-05-30 LAB — BASIC METABOLIC PANEL
BUN: 18 mg/dL (ref 6–23)
CO2: 31 mEq/L (ref 19–32)
Calcium: 9.8 mg/dL (ref 8.4–10.5)
Chloride: 101 mEq/L (ref 96–112)
Creatinine, Ser: 0.76 mg/dL (ref 0.40–1.20)
GFR: 75.29 mL/min (ref >60.00)
Glucose, Bld: 80 mg/dL (ref 70–99)
Potassium: 4.3 mEq/L (ref 3.5–5.1)
Sodium: 138 mEq/L (ref 135–145)

## 2019-05-30 LAB — LIPID PANEL
Cholesterol: 175 mg/dL (ref 0–200)
HDL: 103.8 mg/dL (ref >39.00)
LDL Cholesterol: 59 mg/dL (ref 0–99)
NonHDL: 71.26
Total CHOL/HDL Ratio: 2
Triglycerides: 63 mg/dL (ref 0.0–149.0)
VLDL: 12.6 mg/dL (ref 0.0–40.0)

## 2019-05-30 LAB — HEPATIC FUNCTION PANEL
ALT: 18 U/L (ref 0–35)
AST: 17 U/L (ref 0–37)
Albumin: 4.3 g/dL (ref 3.5–5.2)
Alkaline Phosphatase: 61 U/L (ref 39–117)
Bilirubin, Direct: 0.1 mg/dL (ref 0.0–0.3)
Total Bilirubin: 0.5 mg/dL (ref 0.2–1.2)
Total Protein: 6.7 g/dL (ref 6.0–8.3)

## 2019-06-01 ENCOUNTER — Encounter: Payer: Self-pay | Admitting: Internal Medicine

## 2019-06-01 ENCOUNTER — Ambulatory Visit (INDEPENDENT_AMBULATORY_CARE_PROVIDER_SITE_OTHER): Payer: Medicare Other | Admitting: Internal Medicine

## 2019-06-01 ENCOUNTER — Other Ambulatory Visit: Payer: Self-pay

## 2019-06-01 DIAGNOSIS — E785 Hyperlipidemia, unspecified: Secondary | ICD-10-CM | POA: Diagnosis not present

## 2019-06-01 DIAGNOSIS — I700 Atherosclerosis of aorta: Secondary | ICD-10-CM | POA: Diagnosis not present

## 2019-06-01 DIAGNOSIS — F439 Reaction to severe stress, unspecified: Secondary | ICD-10-CM

## 2019-06-01 DIAGNOSIS — E278 Other specified disorders of adrenal gland: Secondary | ICD-10-CM

## 2019-06-01 DIAGNOSIS — E279 Disorder of adrenal gland, unspecified: Secondary | ICD-10-CM | POA: Diagnosis not present

## 2019-06-01 MED ORDER — SERTRALINE HCL 50 MG PO TABS: 50.0000 mg | ORAL_TABLET | Freq: Every day | ORAL | 3 refills | Status: DC

## 2019-06-01 NOTE — Progress Notes (Signed)
Patient ID: Toni Gardner, female   DOB: 1951-09-08, 68 y.o.   MRN: [id number]   Virtual Visit via video Note  This visit type was conducted due to national recommendations for restrictions regarding the COVID-19 pandemic (e.g. social distancing).  This format is felt to be most appropriate for this patient at this time.  All issues noted in this document were discussed and addressed.  No physical exam was performed (except for noted visual exam findings with Video Visits).   I connected with Toni Gardner by a video enabled telemedicine application or telephone and verified that I am speaking with the correct person using two identifiers. Location patient: home Location provider: work  Persons participating in the virtual visit: patient, provider  I discussed the limitations, risks, security and privacy concerns of performing an evaluation and management service by video and the availability of in person appointments. The patient expressed understanding and agreed to proceed.   Reason for visit: scheduled follow up.    HPI: She reports increased stress.  She has been undergoing w/up for aortic atherosclerosis and left adrenal adenoma.  Saw endocrinology.  Labs normal.  Recommended f/u in one year.  Saw Dr Lucky Cowboy.  ABIs ok.  Recommended continuing aspirin, statin medication and f/u in one year.  Also stress with covid, etc.  Discussed with her today.  On zoloft 25mg  q day.  Does feel has helped.  Discussed increasing dose.  She is agreeable.  No chest pain.  No sob.  No acid reflux.  No abdominal pain. Bowels moving.  Staying in due to covid restrictions.  No fever.  No chest congestion or sob.     ROS: See pertinent positives and negatives per HPI.  Past Medical History:  Diagnosis Date  . Colon polyp   . Urine incontinence     Past Surgical History:  Procedure Laterality Date  . ABDOMINAL HYSTERECTOMY    . ADENOIDECTOMY    . COLONOSCOPY  06/28/2008   Dr Bary Castilla    Family History   Problem Relation Age of Onset  . Heart disease Father   . Breast cancer Maternal Aunt   . Cancer Maternal Uncle        colon  . Colon polyps Sister     SOCIAL HX: reviewed.    Current Outpatient Medications:  .  ALPRAZolam (XANAX) 0.25 MG tablet, 1/2 tablet q day prn, Disp: 30 tablet, Rfl: 1 .  cholecalciferol (VITAMIN D) 1000 units tablet, Take 1,000 Units by mouth daily., Disp: , Rfl:  .  fluticasone (FLONASE) 50 MCG/ACT nasal spray, Place 2 sprays daily into both nostrils., Disp: 16 g, Rfl: 6 .  Probiotic Product (ALIGN) 4 MG CAPS, Take by mouth daily., Disp: , Rfl:  .  rosuvastatin (CRESTOR) 10 MG tablet, Take 1 tablet (10 mg total) by mouth daily., Disp: 30 tablet, Rfl: 2 .  sertraline (ZOLOFT) 25 MG tablet, TAKE 1 TABLET BY MOUTH EVERY DAY, Disp: 90 tablet, Rfl: 1  EXAM:  GENERAL: alert, oriented, appears well and in no acute distress  HEENT: atraumatic, conjunttiva clear, no obvious abnormalities on inspection of external nose and ears  NECK: normal movements of the head and neck  LUNGS: on inspection no signs of respiratory distress, breathing rate appears normal, no obvious gross SOB, gasping or wheezing  CV: no obvious cyanosis  PSYCH/NEURO: pleasant and cooperative, no obvious depression or anxiety, speech and thought processing grossly intact  ASSESSMENT AND PLAN:  Discussed the following assessment and plan:  No  problem-specific Assessment & Plan notes found for this encounter.    I discussed the assessment and treatment plan with the patient. The patient was provided an opportunity to ask questions and all were answered. The patient agreed with the plan and demonstrated an understanding of the instructions.   The patient was advised to call back or seek an in-person evaluation if the symptoms worsen or if the condition fails to improve as anticipated.   Einar Pheasant, MD

## 2019-06-04 ENCOUNTER — Encounter: Payer: Self-pay | Admitting: Internal Medicine

## 2019-06-04 NOTE — Assessment & Plan Note (Signed)
Saw Dr Lucky Cowboy.  ABIs ok.  See Dr Ozella Almond note.  Recommended f/u in one year.  Continue crestor.

## 2019-06-04 NOTE — Assessment & Plan Note (Signed)
Increased stress as outlined.  Discussed with her today.  Increase zoloft to 50mg  q day.  Follow closely.  Schedule f/u soon to reassess.

## 2019-06-04 NOTE — Assessment & Plan Note (Signed)
Adrenal nodule found on CT.  Saw endocrinology.  W/up as outlined.  Labs normal.  Recommended f/u in one year.

## 2019-06-04 NOTE — Assessment & Plan Note (Signed)
On crestor.  Low cholesterol diet and exercise.  Follow lipid panel and liver function tests.   

## 2019-06-13 ENCOUNTER — Other Ambulatory Visit: Payer: Self-pay | Admitting: Internal Medicine

## 2019-08-17 ENCOUNTER — Ambulatory Visit (INDEPENDENT_AMBULATORY_CARE_PROVIDER_SITE_OTHER): Payer: Medicare Other | Admitting: Internal Medicine

## 2019-08-17 ENCOUNTER — Other Ambulatory Visit: Payer: Self-pay

## 2019-08-17 ENCOUNTER — Encounter: Payer: Self-pay | Admitting: Internal Medicine

## 2019-08-17 DIAGNOSIS — F439 Reaction to severe stress, unspecified: Secondary | ICD-10-CM

## 2019-08-17 DIAGNOSIS — E785 Hyperlipidemia, unspecified: Secondary | ICD-10-CM | POA: Diagnosis not present

## 2019-08-17 DIAGNOSIS — E279 Disorder of adrenal gland, unspecified: Secondary | ICD-10-CM

## 2019-08-17 DIAGNOSIS — I700 Atherosclerosis of aorta: Secondary | ICD-10-CM

## 2019-08-17 DIAGNOSIS — E278 Other specified disorders of adrenal gland: Secondary | ICD-10-CM

## 2019-08-17 NOTE — Progress Notes (Signed)
Virtual Visit via video Note  This visit type was conducted due to national recommendations for restrictions regarding the COVID-19 pandemic (e.g. social distancing).  This format is felt to be most appropriate for this patient at this time.  All issues noted in this document were discussed and addressed.  No physical exam was performed (except for noted visual exam findings with Video Visits).   I connected with Toni Gardner by a video enabled telemedicine application and verified that I am speaking with the correct person using two identifiers. Location patient: home Location provider: work  Persons participating in the virtual visit: patient, provider  I discussed the limitations, risks, security and privacy concerns of performing an evaluation and management service by video and the availability of in person appointments. The patient expressed understanding and agreed to proceed.   Reason for visit: scheduled follow up.   HPI: She reports she is doing relatively well.  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  Does not have direct contact with students.  Feels this is a good compromise.  Gets her back into a work routine.  She is on zoloft.  We increased the dose last visit.  She feels she is doing well on the current dose.  On crestor.  Stays active.  No chest pain.  No sob.  No acid reflux.  No abdominal pain.  Bowels moving.  Wants to come in for flu shot in the next 2-3 weeks.     ROS: See pertinent positives and negatives per HPI.  Past Medical History:  Diagnosis Date  . Colon polyp   . Urine incontinence     Past Surgical History:  Procedure Laterality Date  . ABDOMINAL HYSTERECTOMY    . ADENOIDECTOMY    . COLONOSCOPY  06/28/2008   Dr Bary Castilla    Family History  Problem Relation Age of Onset  . Heart disease Father   . Breast cancer Maternal Aunt   . Cancer Maternal Uncle        colon  . Colon polyps Sister      SOCIAL HX: reviewed.    Current Outpatient Medications:  .  ALPRAZolam (XANAX) 0.25 MG tablet, 1/2 tablet q day prn, Disp: 30 tablet, Rfl: 1 .  fluticasone (FLONASE) 50 MCG/ACT nasal spray, Place 2 sprays daily into both nostrils., Disp: 16 g, Rfl: 6 .  rosuvastatin (CRESTOR) 10 MG tablet, TAKE 1 TABLET BY MOUTH EVERY DAY, Disp: 90 tablet, Rfl: 2 .  sertraline (ZOLOFT) 50 MG tablet, Take 1 tablet (50 mg total) by mouth daily., Disp: 30 tablet, Rfl: 3  EXAM:  GENERAL: alert, oriented, appears well and in no acute distress  HEENT: atraumatic, conjunttiva clear, no obvious abnormalities on inspection of external nose and ears  NECK: normal movements of the head and neck  LUNGS: on inspection no signs of respiratory distress, breathing rate appears normal, no obvious gross SOB, gasping or wheezing  CV: no obvious cyanosis  PSYCH/NEURO: pleasant and cooperative, no obvious depression or anxiety, speech and thought processing grossly intact  ASSESSMENT AND PLAN:  Discussed the following assessment and plan:  Aortic atherosclerosis ([place]) On crestor.    Adrenal nodule ([place]) Found on CT - incidental finding.  Saw endocrinology.  W/up as outlined previously.  Labs normal.  Recommended f/u in one year.    Hyperlipidemia On crestor.  Low cholesterol diet and exercise.  Follow lipid panel and liver function tests.  Stress Increased stress.  On zoloft.  Doing better.  Follow.  Continue current dose.      I discussed the assessment and treatment plan with the patient. The patient was provided an opportunity to ask questions and all were answered. The patient agreed with the plan and demonstrated an understanding of the instructions.   The patient was advised to call back or seek an in-person evaluation if the symptoms worsen or if the condition fails to improve as anticipated.   Einar Pheasant, MD

## 2019-08-20 ENCOUNTER — Encounter: Payer: Self-pay | Admitting: Internal Medicine

## 2019-08-20 NOTE — Assessment & Plan Note (Signed)
Found on CT - incidental finding.  Saw endocrinology.  W/up as outlined previously.  Labs normal.  Recommended f/u in one year.

## 2019-08-20 NOTE — Assessment & Plan Note (Signed)
Increased stress.  On zoloft.  Doing better.  Follow.  Continue current dose.

## 2019-08-20 NOTE — Assessment & Plan Note (Signed)
On crestor.   

## 2019-08-20 NOTE — Assessment & Plan Note (Signed)
On crestor.  Low cholesterol diet and exercise.  Follow lipid panel and liver function tests.   

## 2019-09-01 ENCOUNTER — Ambulatory Visit (INDEPENDENT_AMBULATORY_CARE_PROVIDER_SITE_OTHER): Payer: Medicare Other

## 2019-09-01 ENCOUNTER — Other Ambulatory Visit: Payer: Self-pay

## 2019-09-01 DIAGNOSIS — Z23 Encounter for immunization: Secondary | ICD-10-CM

## 2019-09-20 ENCOUNTER — Other Ambulatory Visit: Payer: Self-pay | Admitting: Internal Medicine

## 2019-09-29 ENCOUNTER — Telehealth: Payer: Medicare Other | Admitting: Nurse Practitioner

## 2019-09-29 ENCOUNTER — Encounter: Payer: Self-pay | Admitting: Internal Medicine

## 2019-09-29 DIAGNOSIS — M549 Dorsalgia, unspecified: Secondary | ICD-10-CM

## 2019-09-29 NOTE — Telephone Encounter (Signed)
Patient did not stumble. She started to fall down the stairs but caught herself. She did not fall down the stairs but she hit her back on one of the steps when she caught herself. [facility] is going to [facility] for evaluation

## 2019-09-29 NOTE — Progress Notes (Signed)
Based on what you shared with me, I feel your condition warrants further evaluation and I recommend that you be seen for a face to face visit.  Please contact your primary care physician practice to be seen. Many offices offer virtual options to be seen via video if you are not comfortable going in person to a medical facility at this time.  If you do not have a PCP, [facility] offers a free physician referral service available at [phone]. Our trained staff has the experience, knowledge and resources to put you in touch with a physician who is right for you.   You also have the option of a video visit through [email or web address]  If you are having a true medical emergency please call 911.  NOTE: If you entered your credit card information for this eVisit, you will not be charged. You may see a "hold" on your card for the $35 but that hold will drop off and you will not have a charge processed.  Your e-visit answers were reviewed by a board certified advanced clinical practitioner to complete your personal care plan.  Thank you for using e-Visits.  

## 2019-11-27 ENCOUNTER — Other Ambulatory Visit (INDEPENDENT_AMBULATORY_CARE_PROVIDER_SITE_OTHER): Payer: Medicare Other

## 2019-11-27 ENCOUNTER — Other Ambulatory Visit: Payer: Self-pay

## 2019-11-27 DIAGNOSIS — E785 Hyperlipidemia, unspecified: Secondary | ICD-10-CM

## 2019-11-27 LAB — HEPATIC FUNCTION PANEL
ALT: 20 U/L (ref 0–35)
AST: 19 U/L (ref 0–37)
Albumin: 4.1 g/dL (ref 3.5–5.2)
Alkaline Phosphatase: 59 U/L (ref 39–117)
Bilirubin, Direct: 0.1 mg/dL (ref 0.0–0.3)
Total Bilirubin: 0.3 mg/dL (ref 0.2–1.2)
Total Protein: 6.7 g/dL (ref 6.0–8.3)

## 2019-11-27 LAB — LIPID PANEL
Cholesterol: 168 mg/dL (ref 0–200)
HDL: 95.7 mg/dL (ref >39.00)
LDL Cholesterol: 61 mg/dL (ref 0–99)
NonHDL: 72.42
Total CHOL/HDL Ratio: 2
Triglycerides: 58 mg/dL (ref 0.0–149.0)
VLDL: 11.6 mg/dL (ref 0.0–40.0)

## 2019-11-27 LAB — BASIC METABOLIC PANEL
BUN: 14 mg/dL (ref 6–23)
CO2: 25 mEq/L (ref 19–32)
Calcium: 9.3 mg/dL (ref 8.4–10.5)
Chloride: 105 mEq/L (ref 96–112)
Creatinine, Ser: 0.73 mg/dL (ref 0.40–1.20)
GFR: 78.75 mL/min (ref >60.00)
Glucose, Bld: 92 mg/dL (ref 70–99)
Potassium: 4.1 mEq/L (ref 3.5–5.1)
Sodium: 138 mEq/L (ref 135–145)

## 2019-11-28 ENCOUNTER — Encounter: Payer: Self-pay | Admitting: Internal Medicine

## 2019-11-28 ENCOUNTER — Ambulatory Visit (INDEPENDENT_AMBULATORY_CARE_PROVIDER_SITE_OTHER): Payer: Medicare Other | Admitting: Internal Medicine

## 2019-11-28 DIAGNOSIS — I700 Atherosclerosis of aorta: Secondary | ICD-10-CM | POA: Diagnosis not present

## 2019-11-28 DIAGNOSIS — E278 Other specified disorders of adrenal gland: Secondary | ICD-10-CM

## 2019-11-28 DIAGNOSIS — M810 Age-related osteoporosis without current pathological fracture: Secondary | ICD-10-CM

## 2019-11-28 DIAGNOSIS — Z86010 Personal history of colonic polyps: Secondary | ICD-10-CM

## 2019-11-28 DIAGNOSIS — R821 Myoglobinuria: Secondary | ICD-10-CM

## 2019-11-28 DIAGNOSIS — F439 Reaction to severe stress, unspecified: Secondary | ICD-10-CM

## 2019-11-28 DIAGNOSIS — E785 Hyperlipidemia, unspecified: Secondary | ICD-10-CM

## 2019-11-28 NOTE — Progress Notes (Signed)
Patient ID: Toni Gardner, female   DOB: [birth year], 68 y.o.   MRN: [id number]   Virtual Visit via video Note  This visit type was conducted due to national recommendations for restrictions regarding the COVID-19 pandemic (e.g. social distancing).  This format is felt to be most appropriate for this patient at this time.  All issues noted in this document were discussed and addressed.  No physical exam was performed (except for noted visual exam findings with Video Visits).   I connected with Talibah Mallinson by a video enabled telemedicine application and verified that I am speaking with the correct person using two identifiers. Location patient: home Location provider: work  Persons participating in the virtual visit: patient, provider  The limitations, risks, security and privacy concerns of performing an evaluation and management service by video and the availability of in person appointments have been discussed.  The patient expressed understanding and agreed to proceed.   Reason for visit: scheduled follow up  HPI: She reports she is doing relatively well.  Stays active.  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  This has been going well.  Recently stumbled down stairs.  Hit her back against the stairs.  Had T7 compression fracture.  Saw ortho.  On fosamax now. Discussed bone density.  She wants to hold right now due to covid restrictions.  Will notify me when she wants me to schedule.  Handling stress.  Feels the zoloft has helped with the shaking she used to experience.  Has leveled things out.  Feels this dose is adequate.  States did cologuard last year.     ROS: See pertinent positives and negatives per HPI.  Past Medical History:  Diagnosis Date  . Colon polyp   . Urine incontinence     Past Surgical History:  Procedure Laterality Date  . ABDOMINAL HYSTERECTOMY    . ADENOIDECTOMY    . COLONOSCOPY  06/28/2008   Dr Bary Castilla     Family History  Problem Relation Age of Onset  . Heart disease Father   . Breast cancer Maternal Aunt   . Cancer Maternal Uncle        colon  . Colon polyps Sister     SOCIAL HX: reviewed.    Current Outpatient Medications:  .  ALPRAZolam (XANAX) 0.25 MG tablet, 1/2 tablet q day prn, Disp: 30 tablet, Rfl: 1 .  fluticasone (FLONASE) 50 MCG/ACT nasal spray, Place 2 sprays daily into both nostrils., Disp: 16 g, Rfl: 6 .  rosuvastatin (CRESTOR) 10 MG tablet, TAKE 1 TABLET BY MOUTH EVERY DAY, Disp: 90 tablet, Rfl: 2 .  sertraline (ZOLOFT) 50 MG tablet, TAKE 1 TABLET BY MOUTH EVERY DAY, Disp: 30 tablet, Rfl: 3  EXAM:  GENERAL: alert, oriented, appears well and in no acute distress  HEENT: atraumatic, conjunttiva clear, no obvious abnormalities on inspection of external nose and ears  NECK: normal movements of the head and neck  LUNGS: on inspection no signs of respiratory distress, breathing rate appears normal, no obvious gross SOB, gasping or wheezing  CV: no obvious cyanosis  PSYCH/NEURO: pleasant and cooperative, no obvious depression or anxiety, speech and thought processing grossly intact  ASSESSMENT AND PLAN:  Discussed the following assessment and plan:  Adrenal nodule ([place]) Found as incidental finding on CT.  Saw endocrinology.  W/up as outlined previously.  Endocrinology note reviewed.  Recommended f/u in one year - evaluated 04/2019.    Aortic atherosclerosis (  [place]) Continue crestor.    History of colonic polyps Had cologuard 10/17/2018 - negative.  Previous colonoscopy 06/2008 - mild diverticulosis.    Hyperlipidemia Low cholesterol diet and exercise.  Follow lipid panel and liver function tests.  On crestor.    Osteoporosis On fosamax.  Just restarted.  Discussed bone density.  She will notify me when agreeable to schedule.    Stress On zoloft.  Doing better on zoloft - no shaking.  Follow.      I discussed the assessment and treatment plan with the  patient. The patient was provided an opportunity to ask questions and all were answered. The patient agreed with the plan and demonstrated an understanding of the instructions.   The patient was advised to call back or seek an in-person evaluation if the symptoms worsen or if the condition fails to improve as anticipated.   Einar Pheasant, MD

## 2019-12-03 ENCOUNTER — Encounter: Payer: Self-pay | Admitting: Internal Medicine

## 2019-12-03 NOTE — Assessment & Plan Note (Signed)
Had cologuard 10/17/2018 - negative.  Previous colonoscopy 06/2008 - mild diverticulosis.

## 2019-12-03 NOTE — Assessment & Plan Note (Signed)
On zoloft.  Doing better on zoloft - no shaking.  Follow.

## 2019-12-03 NOTE — Assessment & Plan Note (Signed)
Low cholesterol diet and exercise.  Follow lipid panel and liver function tests.  On crestor.   

## 2019-12-03 NOTE — Assessment & Plan Note (Signed)
Found as incidental finding on CT.  Saw endocrinology.  W/up as outlined previously.  Endocrinology note reviewed.  Recommended f/u in one year - evaluated 04/2019.

## 2019-12-03 NOTE — Assessment & Plan Note (Signed)
On fosamax.  Just restarted.  Discussed bone density.  She will notify me when agreeable to schedule.

## 2019-12-03 NOTE — Assessment & Plan Note (Signed)
Continue crestor 

## 2019-12-15 DIAGNOSIS — M4854XA Collapsed vertebra, not elsewhere classified, thoracic region, initial encounter for fracture: Secondary | ICD-10-CM | POA: Diagnosis not present

## 2019-12-16 ENCOUNTER — Ambulatory Visit (INDEPENDENT_AMBULATORY_CARE_PROVIDER_SITE_OTHER)
Admission: RE | Admit: 2019-12-16 | Discharge: 2019-12-16 | Disposition: A | Discharge status: Home / Self Care | Payer: Medicare PPO | Source: Ambulatory Visit

## 2019-12-16 DIAGNOSIS — R2231 Localized swelling, mass and lump, right upper limb: Secondary | ICD-10-CM

## 2019-12-16 DIAGNOSIS — L0889 Other specified local infections of the skin and subcutaneous tissue: Secondary | ICD-10-CM | POA: Diagnosis not present

## 2019-12-16 DIAGNOSIS — L089 Local infection of the skin and subcutaneous tissue, unspecified: Secondary | ICD-10-CM

## 2019-12-16 DIAGNOSIS — T148XXA Other injury of unspecified body region, initial encounter: Secondary | ICD-10-CM

## 2019-12-16 DIAGNOSIS — S40811A Abrasion of right upper arm, initial encounter: Secondary | ICD-10-CM

## 2019-12-16 MED ORDER — CEPHALEXIN 500 MG PO CAPS: 500.0000 mg | ORAL_CAPSULE | Freq: Three times a day (TID) | ORAL | 0 refills | Status: AC

## 2019-12-16 NOTE — ED Provider Notes (Signed)
Virtual Visit via Video Note:  [name]  initiated request for Telemedicine visit with [facility] team. I connected with [name]  on 12/16/2019 at 12:58 PM  for a synchronized telemedicine visit using a video enabled HIPPA compliant telemedicine application. I verified that I am speaking with [name]  using two identifiers. [name], NP  was physically located in a [facility] Urgent care site and [name] was located at a different location.   The limitations of evaluation and management by telemedicine as well as the availability of in-person appointments were discussed. Patient was informed that she  may incur a bill ( including co-pay) for this virtual visit encounter. [name]  expressed understanding and gave verbal consent to proceed with virtual visit.     History of Present Illness:Toni Gardner  is a 69 y.o. female presents with complaints of scrape to arm which she obtained two weeks ago after a fall. The cut is to her right upper arm. It itches. No noticeable pain, however. No drainage. Cut itself has been healing well but there remains redness and swelling surrounding. This morning the swelling extended down into elbow even. No elbow pain. Some swelling has improved today.  No fevers. No history of MRSA. States she has very thin skin which can lead to easy bruising. Not on any blood thinner. No active drainage.   Past Medical History:  Diagnosis Date  . Colon polyp   . Urine incontinence     Allergies  Allergen Reactions  . Sulfa Antibiotics Swelling        Observations/Objective: Alert, oriented, non toxic in appearance. Clear coherent speech without difficulty. No increased work of breathing visualized.   Quite large healing wound to right upper arm noted, appearing to be approximately 3-6 cm in length; surrounding redness noted although patient denies tenderness; wound edges appear to be healing well and it doe not appear to have  remaining open wound or wound bed present; no drainage; moving arm and elbow without difficulty  Assessment and Plan: Healing wound for the past two weeks. Noticeable redness with swelling surrounding this wound. No significant pain or any drainage, but swelling did increase this morning. Opted to cover for infectious process at this time, with strict in person follow up recommended if any worsening. Small frequent sips of fluids- Pedialyte, Gatorade, water, broth- to maintain hydration.    Follow Up Instructions:    I discussed the assessment and treatment plan with the patient. The patient was provided an opportunity to ask questions and all were answered. The patient agreed with the plan and demonstrated an understanding of the instructions.   The patient was advised to call back or seek an in-person evaluation if the symptoms worsen or if the condition fails to improve as anticipated.  I provided 15 minutes of non-face-to-face time during this encounter.    [name], NP  12/16/2019 12:58 PM         [name], NP 12/17/19 [phone]

## 2019-12-16 NOTE — Discharge Instructions (Signed)
Wash wound daily with soap and water, pat dry.  I wouldn't add any further ointments to this, ok to remain open to air without dressing as long as there is no further open/draining wound.  Complete course of antibiotics.  Please be seen in person at our urgent care or with your primary care doctor if no improvement of this or any worsening in the next week- pain, fevers, drainage, increased redness or swelling.

## 2020-01-11 ENCOUNTER — Other Ambulatory Visit: Payer: Self-pay | Admitting: Internal Medicine

## 2020-01-13 ENCOUNTER — Ambulatory Visit (INDEPENDENT_AMBULATORY_CARE_PROVIDER_SITE_OTHER)
Admission: RE | Admit: 2020-01-13 | Discharge: 2020-01-13 | Disposition: A | Discharge status: Home / Self Care | Payer: Medicare PPO | Source: Ambulatory Visit

## 2020-01-13 DIAGNOSIS — L089 Local infection of the skin and subcutaneous tissue, unspecified: Secondary | ICD-10-CM | POA: Diagnosis not present

## 2020-01-13 DIAGNOSIS — T148XXA Other injury of unspecified body region, initial encounter: Secondary | ICD-10-CM | POA: Diagnosis not present

## 2020-01-13 DIAGNOSIS — Z5189 Encounter for other specified aftercare: Secondary | ICD-10-CM

## 2020-01-13 MED ORDER — DOXYCYCLINE HYCLATE 100 MG PO CAPS: 100.0000 mg | ORAL_CAPSULE | Freq: Two times a day (BID) | ORAL | 0 refills | Status: DC

## 2020-01-13 NOTE — Discharge Instructions (Signed)
Wash daily with warm water and mild soap We will trial another round of antibiotics since patient reports improvement with antibiotic You may use OTC hydrocortisone cream and/or calamine lotion as needed for itching You may also use an allergy medication like zyrtec, allegra or claritin as needed for itching Will follow up with wound clinic or in person if symptoms persists over the next 24- 48 hours Follow up in person or go to the ER if you have any new or worsening symptoms such as fever, chills, nausea, vomiting, redness, swelling, discharge, if symptoms do not improve with medications, etc..Marland Kitchen

## 2020-01-13 NOTE — ED Provider Notes (Signed)
[place]     Virtual Visit via Video Note:  Toni Gardner  initiated request for Telemedicine visit with [facility] team. I connected with Toni Gardner  on 01/13/2020 at 1:24 PM  for a synchronized telemedicine visit using a video enabled HIPPA compliant telemedicine application. I verified that I am speaking with Toni Gardner  using two identifiers. Lestine Box, PA-C  was physically located in a [facility] Urgent care site and Toni Gardner was located at a different location.   The limitations of evaluation and management by telemedicine as well as the availability of in-person appointments were discussed. Patient was informed that she  may incur a bill ( including co-pay) for this virtual visit encounter. Toni Gardner  expressed understanding and gave verbal consent to proceed with virtual visit.   [id number] 01/13/20 Arrival Time: 1300  CC: Wound check  SUBJECTIVE:  Toni Gardner is a 69 y.o. female who presents for wound check.  Slipped in bathrrom and scratched arm on knob in bathroom over a month ago.  Was seen 1 month ago via video visit treated with keflex with relief.  However, concerned wound is not healing.  Localizes the wound to RT elbow.  Describes it as itchy, red, and swelling.  Denies elbow joint pain, ROM deficits or swelling.  Has tried OTC neosporin without improvement.  Symptoms are made worse with scratching.  Denies similar symptoms in the past with poor wound healing.   Denies fever, chills, nausea, vomiting, discharge, SOB, chest pain, abdominal pain, changes in bowel or bladder function.    ROS: As per HPI.  All other pertinent ROS negative.     Past Medical History:  Diagnosis Date  . Colon polyp   . Urine incontinence    Past Surgical History:  Procedure Laterality Date  . ABDOMINAL HYSTERECTOMY    . ADENOIDECTOMY    . COLONOSCOPY  06/28/2008   Dr Bary Castilla   Allergies  Allergen Reactions  . Sulfa Antibiotics Swelling   No  current facility-administered medications on file prior to encounter.   Current Outpatient Medications on File Prior to Encounter  Medication Sig Dispense Refill  . ALPRAZolam (XANAX) 0.25 MG tablet 1/2 tablet q day prn 30 tablet 1  . fluticasone (FLONASE) 50 MCG/ACT nasal spray Place 2 sprays daily into both nostrils. 16 g 6  . rosuvastatin (CRESTOR) 10 MG tablet TAKE 1 TABLET BY MOUTH EVERY DAY 90 tablet 2  . sertraline (ZOLOFT) 50 MG tablet TAKE 1 TABLET BY MOUTH EVERY DAY 30 tablet 3    OBJECTIVE: There were no vitals filed for this visit.  General appearance: alert; no distress Eyes: EOMI grossly HENT: normocephalic; atraumatic Neck: supple with FROM Lungs: normal respiratory effort; speaking in full sentences without difficulty Extremities: moves extremities without difficulty Skin: erythema and swelling to RT lateral aspect of upper arm, elbow and forearm, denies tenderness, no obvious open wounds or drainage (see picture) Neurologic: normal facial expressions Psychological: alert and cooperative; normal mood and affect    ASSESSMENT & PLAN:  1. Visit for wound check   2. Wound infection     Meds ordered this encounter  Medications  . doxycycline (VIBRAMYCIN) 100 MG capsule    Sig: Take 1 capsule (100 mg total) by mouth 2 (two) times daily.    Dispense:  20 capsule    Refill:  0    Order Specific Question:   Supervising Provider    Answer:  Raylene Everts [id number]   Wash daily with warm water and mild soap We will trial another round of antibiotics since patient reports improvement with antibiotic You may use OTC hydrocortisone cream and/or calamine lotion as needed for itching You may also use an allergy medication like zyrtec, allegra or claritin as needed for itching Will follow up with wound clinic or in person if symptoms persists over the next 24- 48 hours Follow up in person or go to the ER if you have any new or worsening symptoms such as fever,  chills, nausea, vomiting, redness, swelling, discharge, if symptoms do not improve with medications, etc...  I discussed the assessment and treatment plan with the patient. The patient was provided an opportunity to ask questions and all were answered. The patient agreed with the plan and demonstrated an understanding of the instructions.   The patient was advised to call back or seek an in-person evaluation if the symptoms worsen or if the condition fails to improve as anticipated.  I provided 13 minutes of non-face-to-face time during this encounter.  [place], PA-C  01/13/2020 1:24 PM    Lestine Box, PA-C 01/13/20 1325

## 2020-01-14 ENCOUNTER — Encounter: Payer: Self-pay | Admitting: Internal Medicine

## 2020-01-17 NOTE — Telephone Encounter (Signed)
Pt spoke with health dept. Stated it was ok for her to get covid vaccine. She is going today. She will get the shot in the opposite arm from infection. Arm is continuing to heal. She is going to complete abx and send update beginning of next week.

## 2020-01-23 DIAGNOSIS — H26493 Other secondary cataract, bilateral: Secondary | ICD-10-CM | POA: Diagnosis not present

## 2020-01-23 DIAGNOSIS — H401131 Primary open-angle glaucoma, bilateral, mild stage: Secondary | ICD-10-CM | POA: Diagnosis not present

## 2020-01-23 DIAGNOSIS — Z961 Presence of intraocular lens: Secondary | ICD-10-CM | POA: Diagnosis not present

## 2020-01-23 NOTE — Telephone Encounter (Signed)
Pt scheduled for a virtual tomorrow morning.

## 2020-01-24 ENCOUNTER — Encounter: Payer: Self-pay | Admitting: Internal Medicine

## 2020-01-24 ENCOUNTER — Other Ambulatory Visit: Payer: Self-pay

## 2020-01-24 ENCOUNTER — Ambulatory Visit (INDEPENDENT_AMBULATORY_CARE_PROVIDER_SITE_OTHER): Payer: Medicare PPO | Admitting: Internal Medicine

## 2020-01-24 DIAGNOSIS — L309 Dermatitis, unspecified: Secondary | ICD-10-CM | POA: Diagnosis not present

## 2020-01-24 DIAGNOSIS — L239 Allergic contact dermatitis, unspecified cause: Secondary | ICD-10-CM | POA: Diagnosis not present

## 2020-01-24 DIAGNOSIS — R21 Rash and other nonspecific skin eruption: Secondary | ICD-10-CM | POA: Diagnosis not present

## 2020-01-24 NOTE — Progress Notes (Signed)
Patient ID: Toni Gardner, female   DOB: 25-Jul-1951, 69 y.o.   MRN: [id number]   Virtual Visit via video Note  This visit type was conducted due to national recommendations for restrictions regarding the COVID-19 pandemic (e.g. social distancing).  This format is felt to be most appropriate for this patient at this time.  All issues noted in this document were discussed and addressed.  No physical exam was performed (except for noted visual exam findings with Video Visits).   I connected with Toni Gardner by a video enabled telemedicine application and verified that I am speaking with the correct person using two identifiers. Location patient: home Location provider: work  Persons participating in the virtual visit: patient, provider  The limitations, risks, security and privacy concerns of performing an evaluation and management service by video and the availability of in person appointments have been discussed.  The patient expressed understanding and agreed to proceed.   Reason for visit: work in appt  HPI: Was initially evaluated 12/16/19 for what was felt to be infection after laceration.  She slipped in the bathroom and cut her right arm.  Was not seen initially.  The laceration healed.  Had increased redness around the laceration.  Was seen 12/16/19 and placed on keflex.  She felt initially this helped (some), but the redness persisted.  Had another visit 01/13/20.  Placed on doxycycline.  Just completed doxycycline yesterday.  States may be some better, but really cannot tell a big change.  No fever.  No nausea, vomiting or diarrhea.  No other rash.  Does not feel bad.     ROS: See pertinent positives and negatives per HPI.  Past Medical History:  Diagnosis Date  . Colon polyp   . Urine incontinence     Past Surgical History:  Procedure Laterality Date  . ABDOMINAL HYSTERECTOMY    . ADENOIDECTOMY    . COLONOSCOPY  06/28/2008   Dr Bary Castilla    Family History  Problem Relation Age of  Onset  . Heart disease Father   . Breast cancer Maternal Aunt   . Cancer Maternal Uncle        colon  . Colon polyps Sister     SOCIAL HX: reviewed.    Current Outpatient Medications:  .  ALPRAZolam (XANAX) 0.25 MG tablet, 1/2 tablet q day prn, Disp: 30 tablet, Rfl: 1 .  doxycycline (VIBRAMYCIN) 100 MG capsule, Take 1 capsule (100 mg total) by mouth 2 (two) times daily., Disp: 20 capsule, Rfl: 0 .  fluticasone (FLONASE) 50 MCG/ACT nasal spray, Place 2 sprays daily into both nostrils., Disp: 16 g, Rfl: 6 .  rosuvastatin (CRESTOR) 10 MG tablet, TAKE 1 TABLET BY MOUTH EVERY DAY, Disp: 90 tablet, Rfl: 2 .  sertraline (ZOLOFT) 50 MG tablet, TAKE 1 TABLET BY MOUTH EVERY DAY, Disp: 30 tablet, Rfl: 3  EXAM:  GENERAL: alert, oriented, appears well and in no acute distress  HEENT: atraumatic, conjunttiva clear, no obvious abnormalities on inspection of external nose and ears  NECK: normal movements of the head and neck  LUNGS: on inspection no signs of respiratory distress, breathing rate appears normal, no obvious gross SOB, gasping or wheezing  CV: no obvious cyanosis  MS: moves arm without noticeable abnormality  SKIN:  S/p laceration - healed - with surrounding erythema.  Does not appear to be c/w cellulitis.  Has some blister - like lesions surrounding.    PSYCH/NEURO: pleasant and cooperative, no obvious depression or anxiety, speech and thought  processing grossly intact  ASSESSMENT AND PLAN:  Discussed the following assessment and plan:  Rash Recent laceration.  Healed.  Has persistent erythema with what appears to be vesicles present. No pain with bending arm, etc.  Has been treated for cellulitis with two rounds of abx.  Does not appear to be c/w cellulitis.  She was questioning the possibility of shingles.  Will have dermatology evaluate.  Hold on further abx.  Dermatology to see pt today.     Orders Placed This Encounter  Procedures  . Ambulatory referral to Dermatology     Referral Priority:   Urgent    Referral Type:   Consultation    Referral Reason:   Specialty Services Required    Requested Specialty:   Dermatology    Number of Visits Requested:   1     I discussed the assessment and treatment plan with the patient. The patient was provided an opportunity to ask questions and all were answered. The patient agreed with the plan and demonstrated an understanding of the instructions.   The patient was advised to call back or seek an in-person evaluation if the symptoms worsen or if the condition fails to improve as anticipated.   Einar Pheasant, MD

## 2020-01-30 ENCOUNTER — Encounter: Payer: Self-pay | Admitting: Internal Medicine

## 2020-01-30 NOTE — Assessment & Plan Note (Signed)
Recent laceration.  Healed.  Has persistent erythema with what appears to be vesicles present. No pain with bending arm, etc.  Has been treated for cellulitis with two rounds of abx.  Does not appear to be c/w cellulitis.  She was questioning the possibility of shingles.  Will have dermatology evaluate.  Hold on further abx.  Dermatology to see pt today.

## 2020-03-04 ENCOUNTER — Other Ambulatory Visit: Payer: Self-pay | Admitting: Internal Medicine

## 2020-03-07 ENCOUNTER — Ambulatory Visit: Payer: Medicare PPO | Admitting: Dermatology

## 2020-03-07 ENCOUNTER — Other Ambulatory Visit: Payer: Self-pay

## 2020-03-07 DIAGNOSIS — L309 Dermatitis, unspecified: Secondary | ICD-10-CM

## 2020-03-07 DIAGNOSIS — L239 Allergic contact dermatitis, unspecified cause: Secondary | ICD-10-CM | POA: Diagnosis not present

## 2020-03-07 DIAGNOSIS — D692 Other nonthrombocytopenic purpura: Secondary | ICD-10-CM | POA: Diagnosis not present

## 2020-03-07 NOTE — Progress Notes (Signed)
   Follow-Up Visit   Subjective  Toni Gardner is a 69 y.o. female who presents for the following: Eczema (F/U  using TMC 0.1% oint BID patient states this has improved) and Allergic contact derm ( F/U using TMC 0.1% oint. BID patient states this has improved).   The following portions of the chart were reviewed this encounter and updated as appropriate: Tobacco  Allergies  Meds  Problems  Med Hx  Surg Hx  Fam Hx      Review of Systems: No other skin or systemic complaints.  Objective  Well appearing patient in no apparent distress; mood and affect are within normal limits.  All skin waist up examined.  Objective  Right Forearm - Anterior: clear  Objective  Right Forearm - Anterior: clear  Assessment & Plan    Purpura - Violaceous macules and patches - Benign - Related to age, sun damage and/or use of blood thinners - Observe - Can use OTC arnica containing moisturizer such as Dermend Bruise Formula if desired - Call for worsening or other concerns   I, Donzetta Kohut, CMA, am acting as scribe for Forest Gleason, MD .  Allergic dermatitis Right Forearm - Anterior  Clear today, continue TMC ointment twice a day as needed  Continue normal moisturizer daily  Eczema, unspecified type Right Forearm - Anterior  Reviewed chronic nature.   Clear today, continue TMC ointment twice daily as needed for itch or recurrent rash. Otherwise use your normal moisturizer daily and gentle skin care to prevent recurrence.  Return if symptoms worsen or fail to improve.   Marene Lenz, scribed for Alfonso Patten, MD.  Documentation: I have reviewed the above documentation for accuracy and completeness, and I agree with the above.  Forest Gleason, MD

## 2020-03-19 ENCOUNTER — Encounter: Payer: Self-pay | Admitting: Dermatology

## 2020-04-09 ENCOUNTER — Other Ambulatory Visit: Payer: Medicare Other

## 2020-04-10 ENCOUNTER — Other Ambulatory Visit: Payer: Self-pay

## 2020-04-10 ENCOUNTER — Other Ambulatory Visit (INDEPENDENT_AMBULATORY_CARE_PROVIDER_SITE_OTHER): Payer: Medicare PPO

## 2020-04-10 DIAGNOSIS — R821 Myoglobinuria: Secondary | ICD-10-CM

## 2020-04-10 DIAGNOSIS — E785 Hyperlipidemia, unspecified: Secondary | ICD-10-CM | POA: Diagnosis not present

## 2020-04-10 DIAGNOSIS — M810 Age-related osteoporosis without current pathological fracture: Secondary | ICD-10-CM | POA: Diagnosis not present

## 2020-04-10 LAB — LIPID PANEL
Cholesterol: 188 mg/dL (ref 0–200)
HDL: 117.7 mg/dL (ref >39.00)
LDL Cholesterol: 58 mg/dL (ref 0–99)
NonHDL: 70.16
Total CHOL/HDL Ratio: 2
Triglycerides: 60 mg/dL (ref 0.0–149.0)
VLDL: 12 mg/dL (ref 0.0–40.0)

## 2020-04-10 LAB — BASIC METABOLIC PANEL
BUN: 14 mg/dL (ref 6–23)
CO2: 29 mEq/L (ref 19–32)
Calcium: 9.8 mg/dL (ref 8.4–10.5)
Chloride: 102 mEq/L (ref 96–112)
Creatinine, Ser: 0.7 mg/dL (ref 0.40–1.20)
GFR: 82.57 mL/min (ref >60.00)
Glucose, Bld: 92 mg/dL (ref 70–99)
Potassium: 4.4 mEq/L (ref 3.5–5.1)
Sodium: 137 mEq/L (ref 135–145)

## 2020-04-10 LAB — TSH: TSH: 0.88 u[IU]/mL (ref 0.35–4.50)

## 2020-04-10 LAB — CBC WITH DIFFERENTIAL/PLATELET
Basophils Absolute: 0.1 10*3/uL (ref 0.0–0.1)
Basophils Relative: 0.5 % (ref 0.0–3.0)
Eosinophils Absolute: 0.2 10*3/uL (ref 0.0–0.7)
Eosinophils Relative: 1.7 % (ref 0.0–5.0)
HCT: 40.7 % (ref 36.0–46.0)
Hemoglobin: 13.8 g/dL (ref 12.0–15.0)
Lymphocytes Relative: 24.3 % (ref 12.0–46.0)
Lymphs Abs: 2.4 10*3/uL (ref 0.7–4.0)
MCHC: 33.9 g/dL (ref 30.0–36.0)
MCV: 96.6 fl (ref 78.0–100.0)
Monocytes Absolute: 1 10*3/uL (ref 0.1–1.0)
Monocytes Relative: 9.7 % (ref 3.0–12.0)
Neutro Abs: 6.3 10*3/uL (ref 1.4–7.7)
Neutrophils Relative %: 63.8 % (ref 43.0–77.0)
Platelets: 193 10*3/uL (ref 150.0–400.0)
RBC: 4.22 Mil/uL (ref 3.87–5.11)
RDW: 14.3 % (ref 11.5–15.5)
WBC: 9.9 10*3/uL (ref 4.0–10.5)

## 2020-04-10 LAB — HEPATIC FUNCTION PANEL
ALT: 20 U/L (ref 0–35)
AST: 20 U/L (ref 0–37)
Albumin: 4.5 g/dL (ref 3.5–5.2)
Alkaline Phosphatase: 56 U/L (ref 39–117)
Bilirubin, Direct: 0.1 mg/dL (ref 0.0–0.3)
Total Bilirubin: 0.5 mg/dL (ref 0.2–1.2)
Total Protein: 7 g/dL (ref 6.0–8.3)

## 2020-04-10 LAB — URINALYSIS, ROUTINE W REFLEX MICROSCOPIC
Bilirubin Urine: NEGATIVE
Hgb urine dipstick: NEGATIVE
Ketones, ur: NEGATIVE
Leukocytes,Ua: NEGATIVE
Nitrite: NEGATIVE
Specific Gravity, Urine: 1.015 (ref 1.000–1.030)
Total Protein, Urine: NEGATIVE
Urine Glucose: NEGATIVE
Urobilinogen, UA: 0.2 (ref 0.0–1.0)
pH: 6.5 (ref 5.0–8.0)

## 2020-04-10 LAB — VITAMIN D 25 HYDROXY (VIT D DEFICIENCY, FRACTURES): VITD: 43.94 ng/mL (ref 30.00–100.00)

## 2020-04-11 ENCOUNTER — Other Ambulatory Visit: Payer: Self-pay

## 2020-04-11 ENCOUNTER — Ambulatory Visit (INDEPENDENT_AMBULATORY_CARE_PROVIDER_SITE_OTHER): Payer: Medicare PPO | Admitting: Internal Medicine

## 2020-04-11 VITALS — BP 122/72 | HR 89 | Temp 97.0°F | Resp 16 | Ht 60.0 in | Wt 101.6 lb

## 2020-04-11 DIAGNOSIS — E785 Hyperlipidemia, unspecified: Secondary | ICD-10-CM | POA: Diagnosis not present

## 2020-04-11 DIAGNOSIS — E278 Other specified disorders of adrenal gland: Secondary | ICD-10-CM | POA: Diagnosis not present

## 2020-04-11 DIAGNOSIS — M810 Age-related osteoporosis without current pathological fracture: Secondary | ICD-10-CM

## 2020-04-11 DIAGNOSIS — F439 Reaction to severe stress, unspecified: Secondary | ICD-10-CM

## 2020-04-11 DIAGNOSIS — Z0000 Encounter for general adult medical examination without abnormal findings: Secondary | ICD-10-CM

## 2020-04-11 DIAGNOSIS — Z1231 Encounter for screening mammogram for malignant neoplasm of breast: Secondary | ICD-10-CM

## 2020-04-11 NOTE — Patient Instructions (Signed)
citrucel - take daily as discussed.

## 2020-04-11 NOTE — Assessment & Plan Note (Signed)
Physical today 04/11/20.  Mammogram 05/23/19 - Birads I.  Schedule f/u mammogram.  cologuard 10/17/18 - negative.

## 2020-04-11 NOTE — Progress Notes (Signed)
Patient ID: Toni Gardner, female   DOB: November 18, 1951, 69 y.o.   MRN: [id number]   Subjective:    Patient ID: Toni Gardner, female    DOB: 1951/03/19, 69 y.o.   MRN: [id number]  HPI This visit occurred during the SARS-CoV-2 public health emergency.  Safety protocols were in place, including screening questions prior to the visit, additional usage of staff PPE, and extensive cleaning of exam room while observing appropriate contact time as indicated for disinfecting solutions.  Patient here for her physical exam.  States she is doing relatively well.  Previous arm wound/rash - healed.  Saw dermatology.  Note reviewed.  Has seen ortho for thoracic compression fracture.  On fosamax.  Pain is better.  Tries to stay active.  No chest pain or sob reported.  No abdominal pain or bowel change reported.  On zoloft.  Working modified schedule.  Overall feels handling stress ok.  Has f/u scheduled with Dr Lucky Cowboy.    Past Medical History:  Diagnosis Date  . Colon polyp   . Dermatitis   . Urine incontinence    Past Surgical History:  Procedure Laterality Date  . ABDOMINAL HYSTERECTOMY    . ADENOIDECTOMY    . COLONOSCOPY  06/28/2008   Dr Bary Castilla   Family History  Problem Relation Age of Onset  . Heart disease Father   . Breast cancer Maternal Aunt   . Cancer Maternal Uncle        colon  . Colon polyps Sister    Social History   Socioeconomic History  . Marital status: Married    Spouse name: Not on file  . Number of children: Not on file  . Years of education: Not on file  . Highest education level: Not on file  Occupational History  . Not on file  Tobacco Use  . Smoking status: Current Some Day Smoker    Types: Cigarettes  . Smokeless tobacco: Never Used  . Tobacco comment: 4 cigarettes per day  Substance and Sexual Activity  . Alcohol use: Yes  . Drug use: No  . Sexual activity: Not on file  Other Topics Concern  . Not on file  Social History Narrative  . Not on file   Social  Determinants of Health   Financial Resource Strain:   . Difficulty of Paying Living Expenses:   Food Insecurity:   . Worried About Charity fundraiser in the Last Year:   . Arboriculturist in the Last Year:   Transportation Needs:   . Film/video editor (Medical):   Marland Kitchen Lack of Transportation (Non-Medical):   Physical Activity:   . Days of Exercise per Week:   . Minutes of Exercise per Session:   Stress:   . Feeling of Stress :   Social Connections:   . Frequency of Communication with Friends and Family:   . Frequency of Social Gatherings with Friends and Family:   . Attends Religious Services:   . Active Member of Clubs or Organizations:   . Attends Archivist Meetings:   Marland Kitchen Marital Status:     Outpatient Encounter Medications as of 04/11/2020  Medication Sig  . alendronate (FOSAMAX) 70 MG tablet Take 70 mg by mouth once a week.  . ALPRAZolam (XANAX) 0.25 MG tablet 1/2 tablet q day prn  . fluticasone (FLONASE) 50 MCG/ACT nasal spray Place 2 sprays daily into both nostrils.  . rosuvastatin (CRESTOR) 10 MG tablet TAKE 1 TABLET BY MOUTH EVERY DAY  .  sertraline (ZOLOFT) 50 MG tablet TAKE 1 TABLET BY MOUTH EVERY DAY  . triamcinolone ointment (KENALOG) 0.1 %   . [DISCONTINUED] doxycycline (VIBRAMYCIN) 100 MG capsule Take 1 capsule (100 mg total) by mouth 2 (two) times daily.   No facility-administered encounter medications on file as of 04/11/2020.    Review of Systems  Constitutional: Negative for appetite change and unexpected weight change.  HENT: Negative for congestion and sinus pressure.   Eyes: Negative for pain and visual disturbance.  Respiratory: Negative for cough, chest tightness and shortness of breath.   Cardiovascular: Negative for chest pain, palpitations and leg swelling.  Gastrointestinal: Negative for abdominal pain, diarrhea, nausea and vomiting.  Genitourinary: Negative for difficulty urinating and dysuria.  Musculoskeletal: Negative for joint  swelling and myalgias.  Skin: Negative for color change and rash.  Neurological: Negative for dizziness, light-headedness and headaches.  Hematological: Negative for adenopathy. Does not bruise/bleed easily.  Psychiatric/Behavioral: Negative for agitation and dysphoric mood.       Objective:    Physical Exam Vitals reviewed.  Constitutional:      General: She is not in acute distress.    Appearance: Normal appearance. She is well-developed.  HENT:     Head: Normocephalic and atraumatic.     Right Ear: External ear normal.     Left Ear: External ear normal.  Eyes:     General: No scleral icterus.       Right eye: No discharge.        Left eye: No discharge.     Conjunctiva/sclera: Conjunctivae normal.  Neck:     Thyroid: No thyromegaly.  Cardiovascular:     Rate and Rhythm: Normal rate and regular rhythm.  Pulmonary:     Effort: No tachypnea, accessory muscle usage or respiratory distress.     Breath sounds: Normal breath sounds. No decreased breath sounds or wheezing.  Chest:     Breasts:        Right: No inverted nipple, mass, nipple discharge or tenderness (no axillary adenopathy).        Left: No inverted nipple, mass, nipple discharge or tenderness (no axilarry adenopathy).  Abdominal:     General: Bowel sounds are normal.     Palpations: Abdomen is soft.     Tenderness: There is no abdominal tenderness.  Musculoskeletal:        General: No tenderness.     Cervical back: Neck supple. No tenderness.  Lymphadenopathy:     Cervical: No cervical adenopathy.  Skin:    Findings: No erythema or rash.  Neurological:     Mental Status: She is alert and oriented to person, place, and time.  Psychiatric:        Mood and Affect: Mood normal.        Behavior: Behavior normal.     BP 122/72   Pulse 89   Temp (!) 97 F (36.1 C)   Resp 16   Ht 5' (1.524 m)   Wt 101 lb 9.6 oz (46.1 kg)   SpO2 98%   BMI 19.84 kg/m  Wt Readings from Last 3 Encounters:  04/19/20 102  lb (46.3 kg)  04/11/20 101 lb 9.6 oz (46.1 kg)  08/17/19 104 lb (47.2 kg)     Lab Results  Component Value Date   WBC 9.9 04/10/2020   HGB 13.8 04/10/2020   HCT 40.7 04/10/2020   PLT 193.0 04/10/2020   GLUCOSE 92 04/10/2020   CHOL 188 04/10/2020   TRIG 60.0 04/10/2020  HDL 117.70 04/10/2020   LDLCALC 58 04/10/2020   ALT 20 04/10/2020   AST 20 04/10/2020   NA 137 04/10/2020   K 4.4 04/10/2020   CL 102 04/10/2020   CREATININE 0.70 04/10/2020   BUN 14 04/10/2020   CO2 29 04/10/2020   TSH 0.88 04/10/2020      Assessment & Plan:   Problem List Items Addressed This Visit    Adrenal nodule ([place])    Found as incidental finding on CT.  Saw endocrinology.  W/up as previously outlined.  Recommended f/u in one year.  Evaluated 04/2019.        Healthcare maintenance    Physical today 04/11/20.  Mammogram 05/23/19 - Birads I.  Schedule f/u mammogram.  cologuard 10/17/18 - negative.        Hyperlipidemia    On crestor.  Low cholesterol diet and exercise.  Follow lipid panel and liver function tests.        Relevant Orders   Hepatic function panel   Lipid panel   Basic metabolic panel   Osteoporosis    On fosamax.  Seeing ortho for compression fracture.  Stable.        Stress    Overall doing better.  On zoloft.  Follow.         Other Visit Diagnoses    Visit for screening mammogram    -  Primary   Relevant Orders   MM 3D SCREEN BREAST BILATERAL       Einar Pheasant, MD

## 2020-04-17 DIAGNOSIS — H26493 Other secondary cataract, bilateral: Secondary | ICD-10-CM | POA: Diagnosis not present

## 2020-04-17 DIAGNOSIS — H401131 Primary open-angle glaucoma, bilateral, mild stage: Secondary | ICD-10-CM | POA: Diagnosis not present

## 2020-04-17 DIAGNOSIS — Z961 Presence of intraocular lens: Secondary | ICD-10-CM | POA: Diagnosis not present

## 2020-04-19 ENCOUNTER — Encounter (INDEPENDENT_AMBULATORY_CARE_PROVIDER_SITE_OTHER): Payer: Self-pay | Admitting: Vascular Surgery

## 2020-04-19 ENCOUNTER — Ambulatory Visit (INDEPENDENT_AMBULATORY_CARE_PROVIDER_SITE_OTHER): Payer: Medicare PPO

## 2020-04-19 ENCOUNTER — Other Ambulatory Visit: Payer: Self-pay

## 2020-04-19 ENCOUNTER — Ambulatory Visit (INDEPENDENT_AMBULATORY_CARE_PROVIDER_SITE_OTHER): Payer: Medicare Other | Admitting: Vascular Surgery

## 2020-04-19 VITALS — BP 122/76 | HR 98 | Resp 15 | Ht 60.0 in | Wt 102.0 lb

## 2020-04-19 DIAGNOSIS — I70211 Atherosclerosis of native arteries of extremities with intermittent claudication, right leg: Secondary | ICD-10-CM

## 2020-04-19 DIAGNOSIS — I700 Atherosclerosis of aorta: Secondary | ICD-10-CM

## 2020-04-19 DIAGNOSIS — E785 Hyperlipidemia, unspecified: Secondary | ICD-10-CM

## 2020-04-19 DIAGNOSIS — I70219 Atherosclerosis of native arteries of extremities with intermittent claudication, unspecified extremity: Secondary | ICD-10-CM | POA: Insufficient documentation

## 2020-04-19 MED ORDER — ASPIRIN EC 81 MG PO TBEC
81.0000 mg | DELAYED_RELEASE_TABLET | Freq: Every day | ORAL | 2 refills | Status: AC
Start: 2020-04-19 — End: ?

## 2020-04-19 NOTE — Assessment & Plan Note (Signed)
Her noninvasive studies today show a significant drop in her right ABI from previous studies now measuring 0.74 and previously being 0.95. Her left ABI is stable at 1.0. Duplex also shows a greater than 50% stenosis in the right external iliac artery. Significant deterioration from her previous studies and her symptoms have worsened as well. I recommend she add aspirin therapy daily. I have discussed continuing her statin agent. She is already trying to walk as much as possible but is very limited. At this point, with what appears to be a significant iliac lesion and a marked deterioration in her symptoms, I think proceeding with angiography with possible revascularization would be the most prudent option. We have discussed the procedure in detail. We discussed the risks of the procedure as well as the benefits. She voices her understanding and is agreeable to proceed with right lower extremity angiogram.

## 2020-04-19 NOTE — Assessment & Plan Note (Signed)
lipid control important in reducing the progression of atherosclerotic disease. Continue statin therapy  

## 2020-04-19 NOTE — Progress Notes (Signed)
MRN : [id number]  Toni Gardner is a 69 y.o. ([birth year]) female who presents with chief complaint of  Chief Complaint  Patient presents with  . Follow-up    ultrasound follow up  .  History of Present Illness: Patient returns today in follow up of peripheral arterial disease and predominant aortoiliac disease. She has noticed deterioration of her claudication symptoms particularly on the right leg over the past year. She can now only walk short distances for having to stop and rest. She does not detail with ischemic rest pain or ulceration. The change has been gradual over several months without a clear cause or inciting event. Her noninvasive studies today show a significant drop in her right ABI from previous studies now measuring 0.74 and previously being 0.95. Her left ABI is stable at 1.0. Duplex also shows a greater than 50% stenosis in the right external iliac artery.  Current Outpatient Medications  Medication Sig Dispense Refill  . alendronate (FOSAMAX) 70 MG tablet Take 70 mg by mouth once a week.    . ALPRAZolam (XANAX) 0.25 MG tablet 1/2 tablet q day prn 30 tablet 1  . fluticasone (FLONASE) 50 MCG/ACT nasal spray Place 2 sprays daily into both nostrils. 16 g 6  . rosuvastatin (CRESTOR) 10 MG tablet TAKE 1 TABLET BY MOUTH EVERY DAY 90 tablet 2  . sertraline (ZOLOFT) 50 MG tablet TAKE 1 TABLET BY MOUTH EVERY DAY 30 tablet 3  . triamcinolone ointment (KENALOG) 0.1 %     . aspirin EC 81 MG tablet Take 1 tablet (81 mg total) by mouth daily. 150 tablet 2   No current facility-administered medications for this visit.    Past Medical History:  Diagnosis Date  . Colon polyp   . Dermatitis   . Urine incontinence     Past Surgical History:  Procedure Laterality Date  . ABDOMINAL HYSTERECTOMY    . ADENOIDECTOMY    . COLONOSCOPY  06/28/2008   Dr Bary Castilla     Social History   Tobacco Use  . Smoking status: Current Some Day Smoker    Types: Cigarettes  . Smokeless  tobacco: Never Used  . Tobacco comment: 4 cigarettes per day  Substance Use Topics  . Alcohol use: Yes  . Drug use: No     Family History  Problem Relation Age of Onset  . Heart disease Father   . Breast cancer Maternal Aunt   . Cancer Maternal Uncle        colon  . Colon polyps Sister     Allergies  Allergen Reactions  . Sulfa Antibiotics Swelling     REVIEW OF SYSTEMS (Negative unless checked)  Constitutional: [] Weight loss  [] Fever  [] Chills Cardiac: [] Chest pain   [] Chest pressure   [] Palpitations   [] Shortness of breath when laying flat   [] Shortness of breath at rest   [] Shortness of breath with exertion. Vascular:  [] Pain in legs with walking   [] Pain in legs at rest   [] Pain in legs when laying flat   [] Claudication   [] Pain in feet when walking  [] Pain in feet at rest  [] Pain in feet when laying flat   [] History of DVT   [] Phlebitis   [] Swelling in legs   [] Varicose veins   [] Non-healing ulcers Pulmonary:   [] Uses home oxygen   [] Productive cough   [] Hemoptysis   [] Wheeze  [] COPD   [] Asthma Neurologic:  [] Dizziness  [] Blackouts   [] Seizures   [] History of stroke   []   History of TIA  [] Aphasia   [] Temporary blindness   [] Dysphagia   [] Weakness or numbness in arms   [] Weakness or numbness in legs Musculoskeletal:  [x] Arthritis   [] Joint swelling   [] Joint pain   [] Low back pain Hematologic:  [] Easy bruising  [] Easy bleeding   [] Hypercoagulable state   [] Anemic   Gastrointestinal:  [] Blood in stool   [] Vomiting blood  [] Gastroesophageal reflux/heartburn   [x] Abdominal pain Genitourinary:  [] Chronic kidney disease   [x] Difficult urination  [] Frequent urination  [] Burning with urination   [] Hematuria Skin:  [] Rashes   [] Ulcers   [] Wounds Psychological:  [] History of anxiety   []  History of major depression.  Physical Examination  BP 122/76 (BP Location: Right Arm)   Pulse 98   Resp 15   Ht 5' (1.524 m)   Wt 102 lb (46.3 kg)   BMI 19.92 kg/m  Gen:  WD/WN, NAD Head:  [place]/AT, No temporalis wasting. Ear/Nose/Throat: Hearing grossly intact, nares w/o erythema or drainage Eyes: Conjunctiva clear. Sclera non-icteric Neck: Supple.  Trachea midline Pulmonary:  Good air movement, no use of accessory muscles.  Cardiac: RRR, no JVD Vascular:  Vessel Right Left  Radial Palpable Palpable                          PT 1+ Palpable Palpable  DP 1+ Palpable Palpable   Gastrointestinal: soft, non-tender/non-distended. No guarding/reflex.  Musculoskeletal: M/S 5/5 throughout.  No deformity or atrophy. No edema. Neurologic: Sensation grossly intact in extremities.  Symmetrical.  Speech is fluent.  Psychiatric: Judgment intact, Mood & affect appropriate for pt's clinical situation. Dermatologic: No rashes or ulcers noted.  No cellulitis or open wounds.       Labs Recent Results (from the past 2160 hour(s))  VITAMIN D 25 Hydroxy (Vit-D Deficiency, Fractures)     Status: None   Collection Time: 04/10/20  9:37 AM  Result Value Ref Range   VITD 43.94 30.00 - 100.00 ng/mL  Hepatic function panel     Status: None   Collection Time: 04/10/20  9:37 AM  Result Value Ref Range   Total Bilirubin 0.5 0.2 - 1.2 mg/dL   Bilirubin, Direct 0.1 0.0 - 0.3 mg/dL   Alkaline Phosphatase 56 39 - 117 U/L   AST 20 0 - 37 U/L   ALT 20 0 - 35 U/L   Total Protein 7.0 6.0 - 8.3 g/dL   Albumin 4.5 3.5 - 5.2 g/dL  Urinalysis, Routine w reflex microscopic     Status: None   Collection Time: 04/10/20  9:37 AM  Result Value Ref Range   Color, Urine YELLOW Yellow;Lt. Yellow;Straw;Dark Yellow;Amber;Green;Red;Brown   APPearance CLEAR Clear;Turbid;Slightly Cloudy;Cloudy   Specific Gravity, Urine 1.015 1.000 - 1.030   pH 6.5 5.0 - 8.0   Total Protein, Urine NEGATIVE Negative   Urine Glucose NEGATIVE Negative   Ketones, ur NEGATIVE Negative   Bilirubin Urine NEGATIVE Negative   Hgb urine dipstick NEGATIVE Negative   Urobilinogen, UA 0.2 0.0 - 1.0   Leukocytes,Ua NEGATIVE Negative    Nitrite NEGATIVE Negative   WBC, UA 0-2/hpf 0-2/hpf   RBC / HPF 0-2/hpf 0-2/hpf   Squamous Epithelial / LPF Rare(0-4/hpf) [id number])  Basic metabolic panel (future)     Status: None   Collection Time: 04/10/20  9:37 AM  Result Value Ref Range   Sodium 137 135 - 145 mEq/L   Potassium 4.4 3.5 - 5.1 mEq/L   Chloride 102 96 - 112 mEq/L  CO2 29 19 - 32 mEq/L   Glucose, Bld 92 70 - 99 mg/dL   BUN 14 6 - 23 mg/dL   Creatinine, Ser 0.70 0.40 - 1.20 mg/dL   GFR 82.57 >60.00 mL/min   Calcium 9.8 8.4 - 10.5 mg/dL  Lipid panel     Status: None   Collection Time: 04/10/20  9:37 AM  Result Value Ref Range   Cholesterol 188 0 - 200 mg/dL    Comment: ATP III Classification       Desirable:  < 200 mg/dL               Borderline High:  200 - 239 mg/dL          High:  > = 240 mg/dL   Triglycerides 60.0 0.0 - 149.0 mg/dL    Comment: Normal:  <150 mg/dLBorderline High:  150 - 199 mg/dL   HDL 117.70 >39.00 mg/dL   VLDL 12.0 0.0 - 40.0 mg/dL   LDL Cholesterol 58 0 - 99 mg/dL   Total CHOL/HDL Ratio 2     Comment:                Men          Women1/2 Average Risk     3.4          3.3Average Risk          5.0          4.42X Average Risk          9.6          7.13X Average Risk          15.0          11.0                       NonHDL 70.16     Comment: NOTE:  Non-HDL goal should be 30 mg/dL higher than patient's LDL goal (i.e. LDL goal of < 70 mg/dL, would have non-HDL goal of < 100 mg/dL)  TSH     Status: None   Collection Time: 04/10/20  9:37 AM  Result Value Ref Range   TSH 0.88 0.35 - 4.50 uIU/mL  CBC with Differential/Platelet     Status: None   Collection Time: 04/10/20  9:37 AM  Result Value Ref Range   WBC 9.9 4.0 - 10.5 K/uL   RBC 4.22 3.87 - 5.11 Mil/uL   Hemoglobin 13.8 12.0 - 15.0 g/dL   HCT 40.7 36.0 - 46.0 %   MCV 96.6 78.0 - 100.0 fl   MCHC 33.9 30.0 - 36.0 g/dL   RDW 14.3 11.5 - 15.5 %   Platelets 193.0 150.0 - 400.0 K/uL   Neutrophils Relative % 63.8 43.0 - 77.0 %    Lymphocytes Relative 24.3 12.0 - 46.0 %   Monocytes Relative 9.7 3.0 - 12.0 %   Eosinophils Relative 1.7 0.0 - 5.0 %   Basophils Relative 0.5 0.0 - 3.0 %   Neutro Abs 6.3 1.4 - 7.7 K/uL   Lymphs Abs 2.4 0.7 - 4.0 K/uL   Monocytes Absolute 1.0 0.1 - 1.0 K/uL   Eosinophils Absolute 0.2 0.0 - 0.7 K/uL   Basophils Absolute 0.1 0.0 - 0.1 K/uL    Radiology No results found.  Assessment/Plan  Hyperlipidemia lipid control important in reducing the progression of atherosclerotic disease. Continue statin therapy   Atherosclerosis of native arteries of extremity with intermittent claudication (HCC) Her noninvasive studies today show  a significant drop in her right ABI from previous studies now measuring 0.74 and previously being 0.95. Her left ABI is stable at 1.0. Duplex also shows a greater than 50% stenosis in the right external iliac artery. Significant deterioration from her previous studies and her symptoms have worsened as well. I recommend she add aspirin therapy daily. I have discussed continuing her statin agent. She is already trying to walk as much as possible but is very limited. At this point, with what appears to be a significant iliac lesion and a marked deterioration in her symptoms, I think proceeding with angiography with possible revascularization would be the most prudent option. We have discussed the procedure in detail. We discussed the risks of the procedure as well as the benefits. She voices her understanding and is agreeable to proceed with right lower extremity angiogram.    Leotis Pain, MD  04/19/2020 10:50 AM    This note was created with Dragon medical transcription system.  Any errors from dictation are purely unintentional

## 2020-04-19 NOTE — Patient Instructions (Signed)
Peripheral Vascular Disease  Peripheral vascular disease (PVD) is a disease of the blood vessels that are not part of your heart and brain. A simple term for PVD is poor circulation. In most cases, PVD narrows the blood vessels that carry blood from your heart to the rest of your body. This can reduce the supply of blood to your arms, legs, and internal organs, like your stomach or kidneys. However, PVD most often affects a person's lower legs and feet. Without treatment, PVD tends to get worse. PVD can also lead to acute ischemic limb. This is when an arm or leg suddenly cannot get enough blood. This is a medical emergency. Follow these instructions at home: Lifestyle  Do not use any products that contain nicotine or tobacco, such as cigarettes and e-cigarettes. If you need help quitting, ask your doctor.  Lose weight if you are overweight. Or, stay at a healthy weight as told by your doctor.  Eat a diet that is low in fat and cholesterol. If you need help, ask your doctor.  Exercise regularly. Ask your doctor for activities that are right for you. General instructions  Take over-the-counter and prescription medicines only as told by your doctor.  Take good care of your feet: ? Wear comfortable shoes that fit well. ? Check your feet often for any cuts or sores.  Keep all follow-up visits as told by your doctor This is important. Contact a doctor if:  You have cramps in your legs when you walk.  You have leg pain when you are at rest.  You have coldness in a leg or foot.  Your skin changes.  You are unable to get or have an erection (erectile dysfunction).  You have cuts or sores on your feet that do not heal. Get help right away if:  Your arm or leg turns cold, numb, and blue.  Your arms or legs become red, warm, swollen, painful, or numb.  You have chest pain.  You have trouble breathing.  You suddenly have weakness in your face, arm, or leg.  You become very  confused or you cannot speak.  You suddenly have a very bad headache.  You suddenly cannot see. Summary  Peripheral vascular disease (PVD) is a disease of the blood vessels.  A simple term for PVD is poor circulation. Without treatment, PVD tends to get worse.  Treatment may include exercise, low fat and low cholesterol diet, and quitting smoking. This information is not intended to replace advice given to you by your health care provider. Make sure you discuss any questions you have with your health care provider. Document Revised: 11/05/2017 Document Reviewed: 12/31/2016 Elsevier Patient Education  2020 [employer]  

## 2020-04-21 ENCOUNTER — Encounter: Payer: Self-pay | Admitting: Internal Medicine

## 2020-04-21 NOTE — Assessment & Plan Note (Signed)
On crestor.  Low cholesterol diet and exercise.  Follow lipid panel and liver function tests.   

## 2020-04-21 NOTE — Assessment & Plan Note (Signed)
Found as incidental finding on CT.  Saw endocrinology.  W/up as previously outlined.  Recommended f/u in one year.  Evaluated 04/2019.

## 2020-04-21 NOTE — Assessment & Plan Note (Signed)
On fosamax.  Seeing ortho for compression fracture.  Stable.

## 2020-04-21 NOTE — Assessment & Plan Note (Signed)
Overall doing better.  On zoloft.  Follow.   

## 2020-04-22 ENCOUNTER — Telehealth (INDEPENDENT_AMBULATORY_CARE_PROVIDER_SITE_OTHER): Payer: Self-pay

## 2020-04-22 ENCOUNTER — Encounter (INDEPENDENT_AMBULATORY_CARE_PROVIDER_SITE_OTHER): Payer: Self-pay

## 2020-04-22 NOTE — Telephone Encounter (Signed)
Patient call back and is now scheduled with Dr. Lucky Cowboy for a right leg angio on 04/29/20 with at 11:00 am arrival time to the [facility]. [facility] will do covid testing on 04/25/20 between 8-1 pm at the [facility]. Pre-procedure instructions were discussed and will be mailed to the patient. Patient originally wanted to be scheduled in July, changed her mind and decided on a sooner appt. Patient declined anything the week of 04/22/20.

## 2020-04-22 NOTE — Telephone Encounter (Signed)
Patient called back wanting to be rescheduled but unsure when. I advised to contact me when she has decided to get rescheduled for her procedure.

## 2020-04-22 NOTE — Telephone Encounter (Signed)
I attempted to contact the patient and a message was left for a return call. 

## 2020-04-25 ENCOUNTER — Other Ambulatory Visit: Admission: RE | Admit: 2020-04-25 | Payer: Medicare PPO | Source: Ambulatory Visit

## 2020-04-25 DIAGNOSIS — E278 Other specified disorders of adrenal gland: Secondary | ICD-10-CM | POA: Diagnosis not present

## 2020-04-29 ENCOUNTER — Ambulatory Visit: Admit: 2020-04-29 | Payer: Medicare PPO | Admitting: Vascular Surgery

## 2020-04-29 DIAGNOSIS — E278 Other specified disorders of adrenal gland: Secondary | ICD-10-CM | POA: Diagnosis not present

## 2020-04-29 SURGERY — LOWER EXTREMITY ANGIOGRAPHY
Anesthesia: Moderate Sedation | Site: Leg Lower | Laterality: Right

## 2020-04-29 NOTE — Telephone Encounter (Signed)
Patient called back and was scheduled with Dr. Lucky Cowboy on 05/09/20 with a 7:45 am arrival time to the [facility]. Patient will do covid testing on 06/01/2 between 8-1 pm at the [facility]. Pre-procedure instructions were discussed and will be mailed.

## 2020-05-05 ENCOUNTER — Other Ambulatory Visit: Payer: Self-pay | Admitting: Internal Medicine

## 2020-05-07 ENCOUNTER — Other Ambulatory Visit
Admission: RE | Admit: 2020-05-07 | Discharge: 2020-05-07 | Disposition: A | Discharge status: Home / Self Care | Payer: Medicare PPO | Source: Ambulatory Visit | Attending: Vascular Surgery | Admitting: Vascular Surgery

## 2020-05-07 ENCOUNTER — Other Ambulatory Visit: Payer: Self-pay

## 2020-05-07 DIAGNOSIS — Z01812 Encounter for preprocedural laboratory examination: Secondary | ICD-10-CM | POA: Insufficient documentation

## 2020-05-07 DIAGNOSIS — Z20822 Contact with and (suspected) exposure to covid-19: Secondary | ICD-10-CM | POA: Insufficient documentation

## 2020-05-07 LAB — SARS CORONAVIRUS 2 (TAT 6-24 HRS): SARS Coronavirus 2: NEGATIVE

## 2020-05-08 ENCOUNTER — Other Ambulatory Visit (INDEPENDENT_AMBULATORY_CARE_PROVIDER_SITE_OTHER): Payer: Self-pay | Admitting: Nurse Practitioner

## 2020-05-09 ENCOUNTER — Encounter
Admission: RE | Disposition: A | Discharge status: Home / Self Care | Payer: Self-pay | Source: Home / Self Care | Attending: Vascular Surgery

## 2020-05-09 ENCOUNTER — Encounter: Payer: Self-pay | Admitting: Vascular Surgery

## 2020-05-09 ENCOUNTER — Ambulatory Visit
Admission: RE | Admit: 2020-05-09 | Discharge: 2020-05-09 | Disposition: A | Discharge status: Home / Self Care | Payer: Medicare PPO | Attending: Vascular Surgery | Admitting: Vascular Surgery

## 2020-05-09 ENCOUNTER — Other Ambulatory Visit: Payer: Self-pay

## 2020-05-09 DIAGNOSIS — E785 Hyperlipidemia, unspecified: Secondary | ICD-10-CM | POA: Insufficient documentation

## 2020-05-09 DIAGNOSIS — I70211 Atherosclerosis of native arteries of extremities with intermittent claudication, right leg: Secondary | ICD-10-CM | POA: Insufficient documentation

## 2020-05-09 DIAGNOSIS — Z7982 Long term (current) use of aspirin: Secondary | ICD-10-CM | POA: Insufficient documentation

## 2020-05-09 DIAGNOSIS — F17210 Nicotine dependence, cigarettes, uncomplicated: Secondary | ICD-10-CM | POA: Diagnosis not present

## 2020-05-09 DIAGNOSIS — Z79899 Other long term (current) drug therapy: Secondary | ICD-10-CM | POA: Diagnosis not present

## 2020-05-09 DIAGNOSIS — I70219 Atherosclerosis of native arteries of extremities with intermittent claudication, unspecified extremity: Secondary | ICD-10-CM

## 2020-05-09 HISTORY — PX: LOWER EXTREMITY ANGIOGRAPHY: CATH118251

## 2020-05-09 LAB — CREATININE, SERUM
Creatinine, Ser: 0.76 mg/dL (ref 0.44–1.00)
GFR calc Af Amer: >60 mL/min (ref >60)
GFR calc non Af Amer: >60 mL/min (ref >60)

## 2020-05-09 LAB — BUN: BUN: 19 mg/dL (ref 8–23)

## 2020-05-09 SURGERY — LOWER EXTREMITY ANGIOGRAPHY
Anesthesia: Moderate Sedation | Site: Leg Lower | Laterality: Right

## 2020-05-09 MED ORDER — CLOPIDOGREL BISULFATE 300 MG PO TABS: 300.0000 mg | ORAL_TABLET | Freq: Once | ORAL | Status: DC

## 2020-05-09 MED ORDER — FENTANYL CITRATE (PF) 100 MCG/2ML IJ SOLN
INTRAMUSCULAR | Status: AC
  Filled 2020-05-09: qty 2

## 2020-05-09 MED ORDER — FENTANYL CITRATE (PF) 100 MCG/2ML IJ SOLN
INTRAMUSCULAR | Status: DC | PRN
  Administered 2020-05-09: 25 ug via INTRAVENOUS
  Administered 2020-05-09: 50 ug via INTRAVENOUS

## 2020-05-09 MED ORDER — HYDROMORPHONE HCL 1 MG/ML IJ SOLN: 1.0000 mg | Freq: Once | INTRAMUSCULAR | Status: DC | PRN

## 2020-05-09 MED ORDER — CLOPIDOGREL BISULFATE 75 MG PO TABS: 300.0000 mg | ORAL_TABLET | Freq: Every day | ORAL | Status: DC

## 2020-05-09 MED ORDER — ONDANSETRON HCL 4 MG/2ML IJ SOLN: 4.0000 mg | Freq: Four times a day (QID) | INTRAMUSCULAR | Status: DC | PRN

## 2020-05-09 MED ORDER — LABETALOL HCL 5 MG/ML IV SOLN: 10.0000 mg | INTRAVENOUS | Status: DC | PRN

## 2020-05-09 MED ORDER — FAMOTIDINE 20 MG PO TABS: 40.0000 mg | ORAL_TABLET | Freq: Once | ORAL | Status: DC | PRN

## 2020-05-09 MED ORDER — CLOPIDOGREL BISULFATE 75 MG PO TABS
ORAL_TABLET | ORAL | Status: AC
  Administered 2020-05-09: 300 mg via ORAL
  Filled 2020-05-09: qty 4

## 2020-05-09 MED ORDER — SODIUM CHLORIDE 0.9 % IV SOLN: INTRAVENOUS | Status: DC

## 2020-05-09 MED ORDER — SODIUM CHLORIDE 0.9% FLUSH: 3.0000 mL | Freq: Two times a day (BID) | INTRAVENOUS | Status: DC

## 2020-05-09 MED ORDER — CLOPIDOGREL BISULFATE 75 MG PO TABS
75.0000 mg | ORAL_TABLET | Freq: Every day | ORAL | Status: DC
Start: 2020-05-10 — End: 2020-05-09

## 2020-05-09 MED ORDER — SODIUM CHLORIDE 0.9% FLUSH: 3.0000 mL | INTRAVENOUS | Status: DC | PRN

## 2020-05-09 MED ORDER — DIPHENHYDRAMINE HCL 50 MG/ML IJ SOLN: 50.0000 mg | Freq: Once | INTRAMUSCULAR | Status: DC | PRN

## 2020-05-09 MED ORDER — CLOPIDOGREL BISULFATE 75 MG PO TABS: 75.0000 mg | ORAL_TABLET | Freq: Every day | ORAL | 11 refills | Status: DC

## 2020-05-09 MED ORDER — MIDAZOLAM HCL 2 MG/2ML IJ SOLN
INTRAMUSCULAR | Status: DC | PRN
  Administered 2020-05-09: 1 mg via INTRAVENOUS
  Administered 2020-05-09: 2 mg via INTRAVENOUS

## 2020-05-09 MED ORDER — ACETAMINOPHEN 325 MG PO TABS: 650.0000 mg | ORAL_TABLET | ORAL | Status: DC | PRN

## 2020-05-09 MED ORDER — CEFAZOLIN SODIUM-DEXTROSE 2-4 GM/100ML-% IV SOLN
2.0000 g | Freq: Once | INTRAVENOUS | Status: AC
  Administered 2020-05-09: 2 g via INTRAVENOUS

## 2020-05-09 MED ORDER — HEPARIN SODIUM (PORCINE) 1000 UNIT/ML IJ SOLN
INTRAMUSCULAR | Status: DC | PRN
  Administered 2020-05-09: 4000 [IU] via INTRAVENOUS

## 2020-05-09 MED ORDER — CEFAZOLIN SODIUM-DEXTROSE 2-4 GM/100ML-% IV SOLN
INTRAVENOUS | Status: AC
  Filled 2020-05-09: qty 100

## 2020-05-09 MED ORDER — HYDRALAZINE HCL 20 MG/ML IJ SOLN: 5.0000 mg | INTRAMUSCULAR | Status: DC | PRN

## 2020-05-09 MED ORDER — MIDAZOLAM HCL 2 MG/ML PO SYRP: 8.0000 mg | ORAL_SOLUTION | Freq: Once | ORAL | Status: DC | PRN

## 2020-05-09 MED ORDER — MIDAZOLAM HCL 5 MG/5ML IJ SOLN
INTRAMUSCULAR | Status: AC
  Filled 2020-05-09: qty 5

## 2020-05-09 MED ORDER — SODIUM CHLORIDE 0.9 % IV SOLN: 250.0000 mL | INTRAVENOUS | Status: DC | PRN

## 2020-05-09 MED ORDER — HEPARIN SODIUM (PORCINE) 1000 UNIT/ML IJ SOLN
INTRAMUSCULAR | Status: AC
  Filled 2020-05-09: qty 1

## 2020-05-09 MED ORDER — METHYLPREDNISOLONE SODIUM SUCC 125 MG IJ SOLR: 125.0000 mg | Freq: Once | INTRAMUSCULAR | Status: DC | PRN

## 2020-05-09 SURGICAL SUPPLY — 22 items
BALLN ULTRVRSE 5X60X75C (BALLOONS) ×3
BALLN ULTRVRSE 8X60X75C (BALLOONS) ×3
BALLOON ULTRVRSE 5X60X75C (BALLOONS) IMPLANT
BALLOON ULTRVRSE 8X60X75C (BALLOONS) IMPLANT
CANNULA 5F STIFF (CANNULA) ×2 IMPLANT
CATH BEACON 5 .035 65 KMP TIP (CATHETERS) ×2 IMPLANT
CATH PIG 70CM (CATHETERS) ×2 IMPLANT
DEVICE PRESTO INFLATION (MISCELLANEOUS) ×2 IMPLANT
DEVICE STARCLOSE SE CLOSURE (Vascular Products) ×4 IMPLANT
DEVICE TORQUE .014-.018 (MISCELLANEOUS) IMPLANT
DEVICE TORQUE .025-.038 (MISCELLANEOUS) ×2 IMPLANT
GLIDEWIRE ADV .035X180CM (WIRE) ×4 IMPLANT
GLIDEWIRE ADV .035X260CM (WIRE) ×2 IMPLANT
GUIDEWIRE ADV .018X180CM (WIRE) ×2 IMPLANT
PACK ANGIOGRAPHY (CUSTOM PROCEDURE TRAY) ×2 IMPLANT
SHEATH BRITE TIP 5FRX11 (SHEATH) ×4 IMPLANT
SHEATH BRITE TIP 6FR X 23 (SHEATH) ×2 IMPLANT
STENT LIFESTAR 9X60 (Permanent Stent) ×2 IMPLANT
SYR MEDRAD MARK 7 150ML (SYRINGE) ×2 IMPLANT
TORQUE DEVICE .014-.018 (MISCELLANEOUS) ×3
TUBING CONTRAST HIGH PRESS 72 (TUBING) ×2 IMPLANT
WIRE J 3MM .035X145CM (WIRE) ×2 IMPLANT

## 2020-05-09 NOTE — Discharge Instructions (Signed)
Clopidogrel tablets What is this medicine? CLOPIDOGREL (kloh PID oh grel) helps to prevent blood clots. This medicine is used to prevent heart attack, stroke, or other vascular events in people who are at high risk. This medicine may be used for other purposes; ask your health care provider or pharmacist if you have questions. COMMON BRAND NAME(S): Plavix What should I tell my health care provider before I take this medicine? They need to know if you have any of the following conditions:  bleeding disorders  bleeding in the brain  having surgery  history of stomach bleeding  an unusual or allergic reaction to clopidogrel, other medicines, foods, dyes, or preservatives  pregnant or trying to get pregnant  breast-feeding How should I use this medicine? Take this medicine by mouth with a glass of water. Follow the directions on the prescription label. You may take this medicine with or without food. If it upsets your stomach, take it with food. Take your medicine at regular intervals. Do not take it more often than directed. Do not stop taking except on your doctor's advice. A special MedGuide will be given to you by the pharmacist with each prescription and refill. Be sure to read this information carefully each time. Talk to your pediatrician regarding the use of this medicine in children. Special care may be needed. Overdosage: If you think you have taken too much of this medicine contact a poison control center or emergency room at once. NOTE: This medicine is only for you. Do not share this medicine with others. What if I miss a dose? If you miss a dose, take it as soon as you can. If it is almost time for your next dose, take only that dose. Do not take double or extra doses. What may interact with this medicine? Do not take this medicine with the following medications:  dasabuvir; ombitasvir; paritaprevir; ritonavir  defibrotide  selexipag This medicine may also interact with  the following medications:  certain medicines that treat or prevent blood clots like warfarin  narcotic medicines for pain  NSAIDs, medicines for pain and inflammation, like ibuprofen or naproxen  repaglinide  SNRIs, medicines for depression, like desvenlafaxine, duloxetine, levomilnacipran, venlafaxine  SSRIs, medicines for depression, like citalopram, escitalopram, fluoxetine, fluvoxamine, paroxetine, sertraline  stomach acid blockers like cimetidine, esomeprazole, omeprazole This list may not describe all possible interactions. Give your health care provider a list of all the medicines, herbs, non-prescription drugs, or dietary supplements you use. Also tell them if you smoke, drink alcohol, or use illegal drugs. Some items may interact with your medicine. What should I watch for while using this medicine? Visit your doctor or health care professional for regular check-ups. Do not stop taking your medicine unless your doctor tells you to. Notify your doctor or health care professional and seek emergency treatment if you develop breathing problems; changes in vision; chest pain; severe, sudden headache; pain, swelling, warmth in the leg; trouble speaking; sudden numbness or weakness of the face, arm or leg. These can be signs that your condition has gotten worse. If you are going to have surgery or dental work, tell your doctor or health care professional that you are taking this medicine. Certain genetic factors may reduce the effect of this medicine. Your doctor may use genetic tests to determine treatment. Only take aspirin if you are instructed to. Low doses of aspirin are used with this medicine to treat some conditions. Taking aspirin with this medicine can increase your risk of bleeding so   you must be careful. Talk to your doctor or pharmacist if you have questions. What side effects may I notice from receiving this medicine? Side effects that you should report to your doctor or  health care professional as soon as possible:  allergic reactions like skin rash, itching or hives, swelling of the face, lips, or tongue  signs and symptoms of bleeding such as bloody or black, tarry stools; red or dark-brown urine; spitting up blood or brown material that looks like coffee grounds; red spots on the skin; unusual bruising or bleeding from the eye, gums, or nose  signs and symptoms of a blood clot such as breathing problems; changes in vision; chest pain; severe, sudden headache; pain, swelling, warmth in the leg; trouble speaking; sudden numbness or weakness of the face, arm or leg  signs and symptoms of low blood sugar such as feeling anxious; confusion; dizziness; increased hunger; unusually weak or tired; increased sweating; shakiness; cold, clammy skin; irritable; headache; blurred vision; fast heartbeat; loss of consciousness Side effects that usually do not require medical attention (report to your doctor or health care professional if they continue or are bothersome):  constipation  diarrhea  headache  upset stomach This list may not describe all possible side effects. Call your doctor for medical advice about side effects. You may report side effects to FDA at 1-800-FDA-1088. Where should I keep my medicine? Keep out of the reach of children. Store at room temperature of 59 to 86 degrees F (15 to 30 degrees C). Throw away any unused medicine after the expiration date. NOTE: This sheet is a summary. It may not cover all possible information. If you have questions about this medicine, talk to your doctor, pharmacist, or health care provider.  2020 Elsevier/Gold Standard (2018-04-25 15:03:38)   Angiogram, Care After This sheet gives you information about how to care for yourself after your procedure. Your doctor may also give you more specific instructions. If you have problems or questions, contact your doctor. Follow these instructions at home: Insertion site  care  Follow instructions from your doctor about how to take care of your long, thin tube (catheter) insertion area. Make sure you: ? Wash your hands with soap and water before you change your bandage (dressing). If you cannot use soap and water, use hand sanitizer. ? Change your bandage as told by your doctor. ? Leave stitches (sutures), skin glue, or skin tape (adhesive) strips in place. They may need to stay in place for 2 weeks or longer. If tape strips get loose and curl up, you may trim the loose edges. Do not remove tape strips completely unless your doctor says it is okay.  Do not take baths, swim, or use a hot tub until your doctor says it is okay.  You may shower 24-48 hours after the procedure or as told by your doctor. ? Gently wash the area with plain soap and water. ? Pat the area dry with a clean towel. ? Do not rub the area. This may cause bleeding.  Do not apply powder or lotion to the area. Keep the area clean and dry.  Check your insertion area every day for signs of infection. Check for: ? More redness, swelling, or pain. ? Fluid or blood. ? Warmth. ? Pus or a bad smell. Activity  Rest as told by your doctor, usually for 1-2 days.  Do not lift anything that is heavier than 10 lbs. (4.5 kg) or as told by your doctor.  Do   not drive for 24 hours if you were given a medicine to help you relax (sedative).  Do not drive or use heavy machinery while taking prescription pain medicine. General instructions   Go back to your normal activities as told by your doctor, usually in about a week. Ask your doctor what activities are safe for you.  If the insertion area starts to bleed, lie flat and put pressure on the area. If the bleeding does not stop, get help right away. This is an emergency.  Drink enough fluid to keep your pee (urine) clear or pale yellow.  Take over-the-counter and prescription medicines only as told by your doctor.  Keep all follow-up visits as  told by your doctor. This is important. Contact a doctor if:  You have a fever.  You have chills.  You have more redness, swelling, or pain around your insertion area.  You have fluid or blood coming from your insertion area.  The insertion area feels warm to the touch.  You have pus or a bad smell coming from your insertion area.  You have more bruising around the insertion area.  Blood collects in the tissue around the insertion area (hematoma) that may be painful to the touch. Get help right away if:  You have a lot of pain in the insertion area.  The insertion area swells very fast.  The insertion area is bleeding, and the bleeding does not stop after holding steady pressure on the area.  The area near or just beyond the insertion area becomes pale, cool, tingly, or numb. These symptoms may be an emergency. Do not wait to see if the symptoms will go away. Get medical help right away. Call your local emergency services (911 in the [place] Do not drive yourself to the hospital. Summary  After the procedure, it is common to have bruising and tenderness at the long, thin tube insertion area.  After the procedure, it is important to rest and drink plenty of fluids.  Do not take baths, swim, or use a hot tub until your doctor says it is okay to do so. You may shower 24-48 hours after the procedure or as told by your doctor.  If the insertion area starts to bleed, lie flat and put pressure on the area. If the bleeding does not stop, get help right away. This is an emergency. This information is not intended to replace advice given to you by your health care provider. Make sure you discuss any questions you have with your health care provider. Document Revised: 11/05/2017 Document Reviewed: 11/17/2016 Elsevier Patient Education  2020 Elsevier Inc.   Femoral Site Care This sheet gives you information about how to care for yourself after your procedure. Your health care provider  may also give you more specific instructions. If you have problems or questions, contact your health care provider. What can I expect after the procedure? After the procedure, it is common to have:  Bruising that usually fades within 1-2 weeks.  Tenderness at the site. Follow these instructions at home: Wound care  Follow instructions from your health care provider about how to take care of your insertion site. Make sure you: ? Wash your hands with soap and water before you change your bandage (dressing). If soap and water are not available, use hand sanitizer. ? Change your dressing as told by your health care provider. ? Leave stitches (sutures), skin glue, or adhesive strips in place. These skin closures may need to stay in   place for 2 weeks or longer. If adhesive strip edges start to loosen and curl up, you may trim the loose edges. Do not remove adhesive strips completely unless your health care provider tells you to do that.  Do not take baths, swim, or use a hot tub until your health care provider approves.  You may shower 24-48 hours after the procedure or as told by your health care provider. ? Gently wash the site with plain soap and water. ? Pat the area dry with a clean towel. ? Do not rub the site. This may cause bleeding.  Do not apply powder or lotion to the site. Keep the site clean and dry.  Check your femoral site every day for signs of infection. Check for: ? Redness, swelling, or pain. ? Fluid or blood. ? Warmth. ? Pus or a bad smell. Activity  For the first 2-3 days after your procedure, or as long as directed: ? Avoid climbing stairs as much as possible. ? Do not squat.  Do not lift anything that is heavier than 10 lb (4.5 kg), or the limit that you are told, until your health care provider says that it is safe.  Rest as directed. ? Avoid sitting for a long time without moving. Get up to take short walks every 1-2 hours.  Do not drive for 24 hours if you  were given a medicine to help you relax (sedative). General instructions  Take over-the-counter and prescription medicines only as told by your health care provider.  Keep all follow-up visits as told by your health care provider. This is important. Contact a health care provider if you have:  A fever or chills.  You have redness, swelling, or pain around your insertion site. Get help right away if:  The catheter insertion area swells very fast.  You pass out.  You suddenly start to sweat or your skin gets clammy.  The catheter insertion area is bleeding, and the bleeding does not stop when you hold steady pressure on the area.  The area near or just beyond the catheter insertion site becomes pale, cool, tingly, or numb. These symptoms may represent a serious problem that is an emergency. Do not wait to see if the symptoms will go away. Get medical help right away. Call your local emergency services (911 in the [place] Do not drive yourself to the hospital. Summary  After the procedure, it is common to have bruising that usually fades within 1-2 weeks.  Check your femoral site every day for signs of infection.  Do not lift anything that is heavier than 10 lb (4.5 kg), or the limit that you are told, until your health care provider says that it is safe. This information is not intended to replace advice given to you by your health care provider. Make sure you discuss any questions you have with your health care provider. Document Revised: 12/06/2017 Document Reviewed: 12/06/2017 Elsevier Patient Education  2020 Elsevier Inc.   Moderate Conscious Sedation, Adult, Care After These instructions provide you with information about caring for yourself after your procedure. Your health care provider may also give you more specific instructions. Your treatment has been planned according to current medical practices, but problems sometimes occur. Call your health care provider if you have  any problems or questions after your procedure. What can I expect after the procedure? After your procedure, it is common:  To feel sleepy for several hours.  To feel clumsy and have poor balance for   several hours.  To have poor judgment for several hours.  To vomit if you eat too soon. Follow these instructions at home: For at least 24 hours after the procedure:   Do not: ? Participate in activities where you could fall or become injured. ? Drive. ? Use heavy machinery. ? Drink alcohol. ? Take sleeping pills or medicines that cause drowsiness. ? Make important decisions or sign legal documents. ? Take care of children on your own.  Rest. Eating and drinking  Follow the diet recommended by your health care provider.  If you vomit: ? Drink water, juice, or soup when you can drink without vomiting. ? Make sure you have little or no nausea before eating solid foods. General instructions  Have a responsible adult stay with you until you are awake and alert.  Take over-the-counter and prescription medicines only as told by your health care provider.  If you smoke, do not smoke without supervision.  Keep all follow-up visits as told by your health care provider. This is important. Contact a health care provider if:  You keep feeling nauseous or you keep vomiting.  You feel light-headed.  You develop a rash.  You have a fever. Get help right away if:  You have trouble breathing. This information is not intended to replace advice given to you by your health care provider. Make sure you discuss any questions you have with your health care provider. Document Revised: 11/05/2017 Document Reviewed: 03/14/2016 Elsevier Patient Education  2020 Elsevier Inc.  

## 2020-05-09 NOTE — H&P (Signed)
[facility] History & Physical Update  The patient was interviewed and re-examined.  The patient's previous History and Physical has been reviewed and is unchanged.  There is no change in the plan of care. We plan to proceed with the scheduled procedure.  Leotis Pain, MD  05/09/2020, 8:11 AM

## 2020-05-09 NOTE — Op Note (Signed)
[facility]  Percutaneous Study/Intervention Procedural Note   Date of Surgery: 05/09/2020  Surgeon(s):Shekira Drummer    Assistants:none  Pre-operative Diagnosis: PAD with claudication RLE  Post-operative diagnosis:  Same  Procedure(s) Performed:             1.  Ultrasound guidance for vascular access bilateral femoral arteries             2.  Catheter placement into right SFA from left femoral approach and into the aorta from the right femoral approach             3.  Aortogram and selective right lower extremity angiogram             4.  Percutaneous transluminal angioplasty of right common femoral artery and proximal SFA was 5 mm diameter Lutonix drug-coated angioplasty balloon             5.   Percutaneous transluminal angioplasty of right external iliac artery with 5 mm diameter angioplasty balloon  6.  Life star stent placement to right external iliac artery with a 9 mm diameter 6 cm length stent postdilated with an 8 mm diameter balloon             7.  StarClose closure device bilateral femoral artery  EBL: 10 cc  Contrast: 60 cc  Fluoro Time: 18.5 minutes  Moderate Conscious Sedation Time: approximately 60 minutes using 3 mg of Versed and 75 mcg of Fentanyl              Indications:  Patient is a 69 y.o.female with disabling claudication symptoms on the right leg. The patient has noninvasive study showing right iliac disease and a reduced right ABI. The patient is brought in for angiography for further evaluation and potential treatment.  Risks and benefits are discussed and informed consent is obtained.   Procedure:  The patient was identified and appropriate procedural time out was performed.  The patient was then placed supine on the table and prepped and draped in the usual sterile fashion. Moderate conscious sedation was administered during a face to face encounter with the patient throughout the procedure with my supervision of the RN administering  medicines and monitoring the patient's vital signs, pulse oximetry, telemetry and mental status throughout from the start of the procedure until the patient was taken to the recovery room. Ultrasound was used to evaluate the left common femoral artery.  It was patent .  A digital ultrasound image was acquired.  A Seldinger needle was used to access the left common femoral artery under direct ultrasound guidance and a permanent image was performed.  A 0.035 J wire was advanced without resistance and a 5Fr sheath was placed.  Pigtail catheter was placed into the aorta and an AP aortogram was performed. This demonstrated relatively normal renal arteries but a large calcific plaque creating mild to moderate stenosis just below the renal artery more so on the left than the right.  The aorta then normalized and the common iliac arteries are relatively normal.  On the left, there is only mild iliac disease but on the right there was a high-grade focal very calcific stenosis that was occlusive or near occlusive.  The vessel then normalized but the distal external iliac and common femoral artery had another lesion that was greater than 80% stenosis and had the appearance of a previous dissection.  This extended down into the common femoral artery but stayed above the femoral bifurcation. I then crossed  the aortic bifurcation but initially without support was unable to cross the lesions in the external iliac artery.  With the catheter in the proximal external iliac artery and performed imaging to evaluate the right leg. Selective right lower extremity angiogram was then performed. This demonstrated that the common femoral artery was heavily diseased greater than 70% stenosis.  The proximal SFA was mildly diseased and the vessel normalized down to the distal SFA were there was mild stenosis of less than 30%.  The popliteal artery normalized and there appeared to be two-vessel runoff distally although opacification was very  poor due to the sluggish inflow. It was felt that it was in the patient's best interest to proceed with intervention after these images to avoid a second procedure and a larger amount of contrast and fluoroscopy based off of the findings from the initial angiogram. The patient was systemically heparinized and a 6 French 23 cm sheath was then placed over the Terumo Advantage wire. I then used a Kumpe catheter and the advantage wire to attempt to cross the right iliac and common femoral lesions but initially found this very difficult due to the dense calcific nature and the appearance of the chronic dissection in the external iliac and proximal common femoral artery.  Ultimately, I elected to access the right distal common femoral/proximal SFA did this with ultrasound guidance and a permanent image was recorded.  A micropuncture needle was used to access the distal right common femoral artery with ultrasound guidance and a micropuncture wire and sheath were placed.  Upsizing to a 5 [place] sheath, I used the advantage wire to cross the lesions without difficulty from the right femoral approach and confirm intraluminal flow in the aorta.  Balloon angioplasty was then performed from the proximal right common femoral artery up to the proximal right external iliac artery with 2 inflations with a 5 mm diameter by 6 cm length angioplasty balloon up to 12 atm for 1 minute.  Following this, was able to cross the lesion from the left femoral approach and get a catheter down into the SFA.  Imaging the right lower extremity through this showed the above findings with better opacification distally.  I then removed the right femoral sheath and placed a StarClose closure device.  Definitive treatment was then performed from the left side and a 5 mm diameter by 10 cm length Lutonix drug-coated angioplasty balloon was inflated up to 12 atm for 1 minute in the proximal SFA and common femoral artery to treat the lesion as well as to  tack down the recent access site.  I placed a 9 mm diameter by 6 cm length life star stent in the right external iliac to encompass the disease in the external iliac artery down to the top of the femoral head.  This was postdilated with an 8 mm balloon with excellent angiographic completion result and less than 10% residual stenosis.  The common femoral artery still had mild to moderate disease but this appeared to be less than 50% and anything further would need to be treated with a femoral endarterectomy. I elected to terminate the procedure. The sheath was removed and StarClose closure device was deployed in the left femoral artery with excellent hemostatic result. The patient was taken to the recovery room in stable condition having tolerated the procedure well.  Findings:               Aortogram:  This demonstrated relatively normal renal arteries but a large calcific plaque  creating mild to moderate stenosis just below the renal artery more so on the left than the right.  The aorta then normalized and the common iliac arteries are relatively normal.  On the left, there is only mild iliac disease but on the right there was a high-grade focal very calcific stenosis that was occlusive or near occlusive.  The vessel then normalized but the distal external iliac and common femoral artery had another lesion that was greater than 80% stenosis and had the appearance of a previous dissection.  This extended down into the common femoral artery but stayed above the femoral bifurcation.             Right lower Extremity:  The common femoral artery was heavily diseased greater than 70% stenosis.  The proximal SFA was mildly diseased and the vessel normalized down to the distal SFA were there was mild stenosis of less than 30%.  The popliteal artery normalized and there appeared to be two-vessel runoff distally although opacification was very poor due to the sluggish inflow.   Disposition: Patient was taken to the  recovery room in stable condition having tolerated the procedure well.  Complications: None  Toni Gardner 05/09/2020 11:09 AM   This note was created with Dragon Medical transcription system. Any errors in dictation are purely unintentional.

## 2020-05-22 ENCOUNTER — Ambulatory Visit (INDEPENDENT_AMBULATORY_CARE_PROVIDER_SITE_OTHER): Payer: Medicare PPO

## 2020-05-22 ENCOUNTER — Telehealth (INDEPENDENT_AMBULATORY_CARE_PROVIDER_SITE_OTHER): Payer: Self-pay

## 2020-05-22 VITALS — Ht 60.0 in | Wt 102.0 lb

## 2020-05-22 DIAGNOSIS — Z0000 Encounter for general adult medical examination without abnormal findings: Secondary | ICD-10-CM

## 2020-05-22 NOTE — Telephone Encounter (Signed)
Patient called wanting to know if she should be taken a Aspirin daily with her Plavix. Per Eulogio Ditch Np she is to take an Aspirin daily with Plavix as that is the recommended treatment for her condition.

## 2020-05-22 NOTE — Patient Instructions (Addendum)
  Ms. Keady , Thank you for taking time to come for your Medicare Wellness Visit. I appreciate your ongoing commitment to your health goals. Please review the following plan we discussed and let me know if I can assist you in the future.   These are the goals we discussed: Goals      Patient Stated   .  I'd like to stay active walking and could increase water intake. (pt-stated)       This is a list of the screening recommended for you and due dates:  Health Maintenance  Topic Date Due  .  Hepatitis C: One time screening is recommended by Center for Disease Control  (CDC) for  adults born from [birth year] through 1967.   Never done  . Tetanus Vaccine  Never done  . Mammogram  05/22/2021  . Cologuard (Stool DNA test)  10/17/2021  . DEXA scan (bone density measurement)  Completed  . COVID-19 Vaccine  Completed  . Pneumonia vaccines  Completed  . Flu Shot  Discontinued

## 2020-05-22 NOTE — Progress Notes (Addendum)
Subjective:   Toni Gardner is a 69 y.o. female who presents for an Initial Medicare Annual Wellness Visit.  Review of Systems    No ROS.  Medicare Wellness Virtual Visit.   Cardiac Risk Factors include: advanced age (>52men, >21 women)     Objective:    Today's Vitals   05/22/20 1301  Weight: 102 lb (46.3 kg)  Height: 5' (1.524 m)   Body mass index is 19.92 kg/m.  Advanced Directives 05/22/2020 05/09/2020 10/03/2017  Does Patient Have a Medical Advance Directive? No No No  Would patient like information on creating a medical advance directive? No - Patient declined Yes (MAU/Ambulatory/Procedural Areas - Information given) -    Current Medications (verified) Outpatient Encounter Medications as of 05/22/2020  Medication Sig  . alendronate (FOSAMAX) 70 MG tablet Take 70 mg by mouth once a week.  . ALPRAZolam (XANAX) 0.25 MG tablet 1/2 tablet q day prn  . aspirin EC 81 MG tablet Take 1 tablet (81 mg total) by mouth daily.  . clopidogrel (PLAVIX) 75 MG tablet Take 1 tablet (75 mg total) by mouth daily.  . fluticasone (FLONASE) 50 MCG/ACT nasal spray Place 2 sprays daily into both nostrils.  . rosuvastatin (CRESTOR) 10 MG tablet TAKE 1 TABLET BY MOUTH EVERY DAY  . sertraline (ZOLOFT) 50 MG tablet TAKE 1 TABLET BY MOUTH EVERY DAY  . triamcinolone ointment (KENALOG) 0.1 %    No facility-administered encounter medications on file as of 05/22/2020.    Allergies (verified) Sulfa antibiotics   History: Past Medical History:  Diagnosis Date  . Colon polyp   . Dermatitis   . Urine incontinence    Past Surgical History:  Procedure Laterality Date  . ABDOMINAL HYSTERECTOMY    . ADENOIDECTOMY    . COLONOSCOPY  06/28/2008   Dr Bary Castilla  . LOWER EXTREMITY ANGIOGRAPHY Right 05/09/2020   Procedure: LOWER EXTREMITY ANGIOGRAPHY;  Surgeon: Algernon Huxley, MD;  Location: [facility];  Service: Cardiovascular;  Laterality: Right;   Family History  Problem Relation Age of Onset  .  Heart disease Father   . Breast cancer Maternal Aunt   . Cancer Maternal Uncle        colon  . Colon polyps Sister    Social History   Socioeconomic History  . Marital status: Married    Spouse name: Not on file  . Number of children: Not on file  . Years of education: Not on file  . Highest education level: Not on file  Occupational History  . Not on file  Tobacco Use  . Smoking status: Former Smoker    Years: 20.00    Types: Cigarettes    Quit date: 04/11/2020    Years since quitting: 0.1  . Smokeless tobacco: Never Used  Vaping Use  . Vaping Use: Never used  Substance and Sexual Activity  . Alcohol use: Yes  . Drug use: No  . Sexual activity: Not on file  Other Topics Concern  . Not on file  Social History Narrative  . Not on file   Social Determinants of Health   Financial Resource Strain:   . Difficulty of Paying Living Expenses:   Food Insecurity:   . Worried About Charity fundraiser in the Last Year:   . Arboriculturist in the Last Year:   Transportation Needs:   . Film/video editor (Medical):   Marland Kitchen Lack of Transportation (Non-Medical):   Physical Activity:   . Days  of Exercise per Week:   . Minutes of Exercise per Session:   Stress:   . Feeling of Stress :   Social Connections:   . Frequency of Communication with Friends and Family:   . Frequency of Social Gatherings with Friends and Family:   . Attends Religious Services:   . Active Member of Clubs or Organizations:   . Attends Archivist Meetings:   Marland Kitchen Marital Status:     Tobacco Counseling Counseling given: Not Answered   Clinical Intake:  Pre-visit preparation completed: Yes        Diabetes: No  How often do you need to have someone help you when you read instructions, pamphlets, or other written materials from your doctor or pharmacy?: 1 - Never  Interpreter Needed?: No      Activities of Daily Living In your present state of health, do you have any difficulty  performing the following activities: 05/22/2020 05/09/2020  Hearing? N N  Vision? N N  Difficulty concentrating or making decisions? N N  Walking or climbing stairs? Y N  Comment Recent surgery; stent placed R leg. See care everywhere. -  Dressing or bathing? N N  Doing errands, shopping? N -  Preparing Food and eating ? N -  Using the Toilet? N -  In the past six months, have you accidently leaked urine? N -  Do you have problems with loss of bowel control? N -  Managing your Medications? N -  Managing your Finances? N -  Housekeeping or managing your Housekeeping? N -  Some recent data might be hidden     Immunizations and Health Maintenance Immunization History  Administered Date(s) Administered  . Fluad Quad(high Dose 65+) 09/01/2019  . Influenza, High Dose Seasonal PF 08/25/2018  . PFIZER SARS-COV-2 Vaccination 01/17/2020, 02/07/2020  . Pneumococcal Conjugate-13 08/05/2017  . Pneumococcal Polysaccharide-23 09/08/2018   Health Maintenance Due  Topic Date Due  . TETANUS/TDAP  Never done    Patient Care Team: Einar Pheasant, MD as PCP - General (Internal Medicine)  Indicate any recent Medical Services you may have received from other than [facility] providers in the past year (date may be approximate).     Assessment:   This is a routine wellness examination for Toni Gardner.  I connected with Trystyn today by telephone and verified that I am speaking with the correct person using two identifiers. Location patient: home Location provider: work Persons participating in the virtual visit: patient, Marine scientist.    I discussed the limitations, risks, security and privacy concerns of performing an evaluation and management service by telephone and the availability of in person appointments. The patient expressed understanding and verbally consented to this telephonic visit.    Interactive audio and video telecommunications were attempted between this provider and patient, however failed, due  to patient having technical difficulties OR patient did not have access to video capability.  We continued and completed visit with audio only.  Some vital signs may be absent or patient reported.   Health Maintenance Due: -Tdap vaccine-  to be completed with doctor in visit or local pharmacy.   -Hep C Screening deferred. See completed HM at the end of note.   Eye: Visual acuity not assessed. Virtual visit. Followed by their ophthalmologist.  Dental: UTD    Hearing: Demonstrates normal hearing during visit.  Safety:  Patient feels safe at home- yes Patient does have smoke detectors at home- yes Patient does wear sunscreen or protective clothing when in direct sunlight -  yes Patient does wear seat belt when in a moving vehicle - yes Patient drives- yes Adequate lighting in walkways free from debris- yes Grab bars and handrails used as appropriate- yes Ambulates with an assistive device- no Cell phone on person when ambulating outside of the home- yes  Social: Alcohol intake - yes      Smoking history- former  Smokers in home? none Illicit drug use? none  Medication: Taking as directed and without issues.  Pill box in use -yes  Self managed - yes  Plavix and Aspirin added since stent added to R leg.    Covid-19: Precautions and sickness symptoms discussed. Wears mask, social distancing, hand hygiene as appropriate.   Activities of Daily Living Patient denies needing assistance with: household chores, feeding themselves, getting from bed to chair, getting to the toilet, bathing/showering, dressing, managing money, or preparing meals.   Discussed the importance of a healthy diet, water intake and the benefits of aerobic exercise.   Physical activity- walking 30 minutes daily  Diet:  Regular Water: fair intake Caffeine: 1 cup of coffee  Other Providers Patient Care Team: Einar Pheasant, MD as PCP - General (Internal Medicine)  Hearing/Vision screen  Hearing  Screening   125Hz  250Hz  500Hz  1000Hz  2000Hz  3000Hz  4000Hz  6000Hz  8000Hz   Right ear:           Left ear:           Comments: Patient is able to hear conversational tones without difficulty.  No issues reported.   Vision Screening Comments: Wears reader lenses Cataract extraction, bilateral Visual acuity not assessed, virtual visit.  They have seen their ophthalmologist in the last 12 months.     Dietary issues and exercise activities discussed: Current Exercise Habits: Home exercise routine, Type of exercise: walking, Time (Minutes): 30, Frequency (Times/Week): 7, Weekly Exercise (Minutes/Week): 210, Intensity: Mild  Goals      Patient Stated   .  I'd like to stay active walking and could increase water intake. (pt-stated)      Depression Screen PHQ 2/9 Scores 05/22/2020 08/17/2019 10/13/2017 09/15/2016  PHQ - 2 Score 0 0 1 0  PHQ- 9 Score - - 4 -    Fall Risk Fall Risk  05/22/2020 06/01/2019 10/13/2017 09/15/2016  Falls in the past year? 1 0 No No  Comment None new since last reported - - -  Number falls in past yr: 0 - - -  Follow up Falls evaluation completed - - -    Is the patient's home free of loose throw rugs in walkways, pet beds, electrical cords, etc?   Yes      Handrails on the stairs? Yes      Adequate lighting?  Yes  Timed Get Up and Go Performed No, virtual visit  Cognitive Function: Patient is alert and oriented x3. Patient denies difficulty focusing or concentrating. Patient likes to play brain challenging games for brain health.  6CIT Screen 05/22/2020  What Year? 0 points  What month? 0 points  Months in reverse 0 points  Repeat phrase 0 points    Screening Tests Health Maintenance  Topic Date Due  . TETANUS/TDAP  Never done  . Hepatitis C Screening  05/22/2021 (Originally November 17, 1951)  . MAMMOGRAM  05/22/2021  . Fecal DNA (Cologuard)  10/17/2021  . DEXA SCAN  Completed  . COVID-19 Vaccine  Completed  . PNA vac Low Risk Adult  Completed  .  INFLUENZA VACCINE  Discontinued     Plan:  Keep all routine maintenance appointments.   Next scheduled fasting lab 08/13/20 @ 8:15  Follow up 08/22/20 @ 1:30. Scheduled per PMD last note.   Medicare Attestation I have personally reviewed: The patient's medical and social history Their use of alcohol, tobacco or illicit drugs Their current medications and supplements The patient's functional ability including ADLs,fall risks, home safety risks, cognitive, and hearing and visual impairment Diet and physical activities Evidence for depression   I have reviewed and discussed with patient certain preventive protocols, quality metrics, and best practice recommendations.      OBrien-Blaney, Zyan Coby L, LPN   4/82/7078     I have reviewed the above information and agree with above.   Deborra Medina, MD

## 2020-05-31 ENCOUNTER — Ambulatory Visit (INDEPENDENT_AMBULATORY_CARE_PROVIDER_SITE_OTHER): Payer: Medicare PPO | Admitting: Nurse Practitioner

## 2020-05-31 ENCOUNTER — Encounter (INDEPENDENT_AMBULATORY_CARE_PROVIDER_SITE_OTHER): Payer: Medicare PPO

## 2020-06-03 ENCOUNTER — Encounter (INDEPENDENT_AMBULATORY_CARE_PROVIDER_SITE_OTHER): Payer: Medicare PPO

## 2020-06-05 ENCOUNTER — Ambulatory Visit (INDEPENDENT_AMBULATORY_CARE_PROVIDER_SITE_OTHER): Payer: Medicare PPO | Admitting: Nurse Practitioner

## 2020-06-05 ENCOUNTER — Encounter (INDEPENDENT_AMBULATORY_CARE_PROVIDER_SITE_OTHER): Payer: Medicare PPO

## 2020-06-12 ENCOUNTER — Ambulatory Visit
Admission: RE | Admit: 2020-06-12 | Discharge: 2020-06-12 | Disposition: A | Discharge status: Home / Self Care | Payer: Medicare PPO | Source: Ambulatory Visit | Attending: Internal Medicine | Admitting: Internal Medicine

## 2020-06-12 ENCOUNTER — Other Ambulatory Visit (INDEPENDENT_AMBULATORY_CARE_PROVIDER_SITE_OTHER): Payer: Self-pay | Admitting: Vascular Surgery

## 2020-06-12 DIAGNOSIS — Z1231 Encounter for screening mammogram for malignant neoplasm of breast: Secondary | ICD-10-CM

## 2020-06-12 DIAGNOSIS — I70211 Atherosclerosis of native arteries of extremities with intermittent claudication, right leg: Secondary | ICD-10-CM

## 2020-06-12 DIAGNOSIS — Z95820 Peripheral vascular angioplasty status with implants and grafts: Secondary | ICD-10-CM

## 2020-06-13 ENCOUNTER — Ambulatory Visit (INDEPENDENT_AMBULATORY_CARE_PROVIDER_SITE_OTHER): Payer: Medicare PPO

## 2020-06-13 ENCOUNTER — Other Ambulatory Visit: Payer: Self-pay

## 2020-06-13 ENCOUNTER — Encounter (INDEPENDENT_AMBULATORY_CARE_PROVIDER_SITE_OTHER): Payer: Self-pay | Admitting: Nurse Practitioner

## 2020-06-13 ENCOUNTER — Ambulatory Visit (INDEPENDENT_AMBULATORY_CARE_PROVIDER_SITE_OTHER): Payer: Medicare PPO | Admitting: Nurse Practitioner

## 2020-06-13 VITALS — BP 126/84 | HR 76 | Ht 60.0 in | Wt 103.0 lb

## 2020-06-13 DIAGNOSIS — Z95820 Peripheral vascular angioplasty status with implants and grafts: Secondary | ICD-10-CM

## 2020-06-13 DIAGNOSIS — E785 Hyperlipidemia, unspecified: Secondary | ICD-10-CM | POA: Diagnosis not present

## 2020-06-13 DIAGNOSIS — I70211 Atherosclerosis of native arteries of extremities with intermittent claudication, right leg: Secondary | ICD-10-CM | POA: Diagnosis not present

## 2020-06-13 DIAGNOSIS — M179 Osteoarthritis of knee, unspecified: Secondary | ICD-10-CM | POA: Insufficient documentation

## 2020-06-13 NOTE — Progress Notes (Signed)
Subjective:    Patient ID: Toni Gardner, female    DOB: 06/25/1951, 69 y.o.   MRN: [id number] Chief Complaint  Patient presents with  . Follow-up    3 wk [facility] post Lower extremity angiography    The patient returns to the office for followup and review status post angiogram with intervention. The patient notes improvement in the lower extremity symptoms. No interval shortening of the patient's claudication distance or rest pain symptoms. Previous wounds have now healed.  No new ulcers or wounds have occurred since the last visit.  There have been no significant changes to the patient's overall health care.  The patient denies amaurosis fugax or recent TIA symptoms. There are no recent neurological changes noted. The patient denies history of DVT, PE or superficial thrombophlebitis. The patient denies recent episodes of angina or shortness of breath.   ABI's Rt=1.06 and Lt=1.00  (previous ABI's Rt=0.74 and Lt=1.03) Duplex US of the bilateral tibial arteries has triphasic waveforms with good toe waveforms bilaterally   Review of Systems  All other systems reviewed and are negative.      Objective:   Physical Exam Vitals reviewed.  HENT:     Head: Normocephalic.  Cardiovascular:     Rate and Rhythm: Normal rate and regular rhythm.     Pulses: Normal pulses.  Pulmonary:     Effort: Pulmonary effort is normal.  Musculoskeletal:        General: Normal range of motion.  Skin:    General: Skin is warm and dry.     Capillary Refill: Capillary refill takes less than 2 seconds.  Neurological:     Mental Status: She is alert.  Psychiatric:        Mood and Affect: Mood normal.        Behavior: Behavior normal.        Thought Content: Thought content normal.        Judgment: Judgment normal.     BP 126/84   Pulse 76   Ht 5' (1.524 m)   Wt 103 lb (46.7 kg)   BMI 20.12 kg/m   Past Medical History:  Diagnosis Date  . Colon polyp   . Dermatitis   . Urine incontinence      Social History   Socioeconomic History  . Marital status: Married    Spouse name: Not on file  . Number of children: Not on file  . Years of education: Not on file  . Highest education level: Not on file  Occupational History  . Not on file  Tobacco Use  . Smoking status: Former Smoker    Years: 20.00    Types: Cigarettes    Quit date: 04/11/2020    Years since quitting: 0.1  . Smokeless tobacco: Never Used  Vaping Use  . Vaping Use: Never used  Substance and Sexual Activity  . Alcohol use: Yes  . Drug use: No  . Sexual activity: Not on file  Other Topics Concern  . Not on file  Social History Narrative  . Not on file   Social Determinants of Health   Financial Resource Strain:   . Difficulty of Paying Living Expenses:   Food Insecurity:   . Worried About Charity fundraiser in the Last Year:   . Arboriculturist in the Last Year:   Transportation Needs:   . Film/video editor (Medical):   Marland Kitchen Lack of Transportation (Non-Medical):   Physical Activity:   . Days  of Exercise per Week:   . Minutes of Exercise per Session:   Stress:   . Feeling of Stress :   Social Connections:   . Frequency of Communication with Friends and Family:   . Frequency of Social Gatherings with Friends and Family:   . Attends Religious Services:   . Active Member of Clubs or Organizations:   . Attends Archivist Meetings:   Marland Kitchen Marital Status:   Intimate Partner Violence:   . Fear of Current or Ex-Partner:   . Emotionally Abused:   Marland Kitchen Physically Abused:   . Sexually Abused:     Past Surgical History:  Procedure Laterality Date  . ABDOMINAL HYSTERECTOMY    . ADENOIDECTOMY    . COLONOSCOPY  06/28/2008   Dr Bary Castilla  . LOWER EXTREMITY ANGIOGRAPHY Right 05/09/2020   Procedure: LOWER EXTREMITY ANGIOGRAPHY;  Surgeon: Algernon Huxley, MD;  Location: [place] [facility];  Service: Cardiovascular;  Laterality: Right;    Family History  Problem Relation Age of Onset  . Heart  disease Father   . Breast cancer Maternal Aunt   . Cancer Maternal Uncle        colon  . Colon polyps Sister     Allergies  Allergen Reactions  . Sulfa Antibiotics Swelling       Assessment & Plan:   1. Atherosclerosis of native artery of right lower extremity with intermittent claudication (HCC) Recommend:  The patient is status post successful angiogram with intervention.  The patient reports that the claudication symptoms and leg pain is essentially gone.   The patient denies lifestyle limiting changes at this point in time.  No further invasive studies, angiography or surgery at this time The patient should continue walking and begin a more formal exercise program.  The patient should continue antiplatelet therapy and aggressive treatment of the lipid abnormalities  Patient should undergo noninvasive studies as ordered. The patient will follow up with me after the studies.    2. Hyperlipidemia, unspecified hyperlipidemia type Continue statin as ordered and reviewed, no changes at this time    Current Outpatient Medications on File Prior to Visit  Medication Sig Dispense Refill  . alendronate (FOSAMAX) 70 MG tablet Take 70 mg by mouth once a week.    . ALPRAZolam (XANAX) 0.25 MG tablet 1/2 tablet q day prn 30 tablet 1  . aspirin 81 MG EC tablet Take by mouth.    Marland Kitchen aspirin EC 81 MG tablet Take 1 tablet (81 mg total) by mouth daily. 150 tablet 2  . clopidogrel (PLAVIX) 75 MG tablet Take 1 tablet (75 mg total) by mouth daily. 30 tablet 11  . fluticasone (FLONASE) 50 MCG/ACT nasal spray Place 2 sprays daily into both nostrils. 16 g 6  . rosuvastatin (CRESTOR) 10 MG tablet TAKE 1 TABLET BY MOUTH EVERY DAY 90 tablet 2  . sertraline (ZOLOFT) 50 MG tablet TAKE 1 TABLET BY MOUTH EVERY DAY 30 tablet 3  . triamcinolone ointment (KENALOG) 0.1 %      No current facility-administered medications on file prior to visit.    There are no Patient Instructions on file for this  visit. No follow-ups on file.   Kris Hartmann, NP

## 2020-08-13 ENCOUNTER — Other Ambulatory Visit: Payer: Self-pay

## 2020-08-13 ENCOUNTER — Other Ambulatory Visit (INDEPENDENT_AMBULATORY_CARE_PROVIDER_SITE_OTHER): Payer: Medicare PPO

## 2020-08-13 DIAGNOSIS — E785 Hyperlipidemia, unspecified: Secondary | ICD-10-CM

## 2020-08-13 LAB — BASIC METABOLIC PANEL
BUN: 16 mg/dL (ref 6–23)
CO2: 27 mEq/L (ref 19–32)
Calcium: 9.6 mg/dL (ref 8.4–10.5)
Chloride: 102 mEq/L (ref 96–112)
Creatinine, Ser: 0.75 mg/dL (ref 0.40–1.20)
GFR: 76.18 mL/min (ref >60.00)
Glucose, Bld: 86 mg/dL (ref 70–99)
Potassium: 4.7 mEq/L (ref 3.5–5.1)
Sodium: 139 mEq/L (ref 135–145)

## 2020-08-13 LAB — HEPATIC FUNCTION PANEL
ALT: 21 U/L (ref 0–35)
AST: 22 U/L (ref 0–37)
Albumin: 4.5 g/dL (ref 3.5–5.2)
Alkaline Phosphatase: 55 U/L (ref 39–117)
Bilirubin, Direct: 0.1 mg/dL (ref 0.0–0.3)
Total Bilirubin: 0.3 mg/dL (ref 0.2–1.2)
Total Protein: 7.3 g/dL (ref 6.0–8.3)

## 2020-08-13 LAB — LIPID PANEL
Cholesterol: 177 mg/dL (ref 0–200)
HDL: 107.2 mg/dL (ref >39.00)
LDL Cholesterol: 47 mg/dL (ref 0–99)
NonHDL: 69.95
Total CHOL/HDL Ratio: 2
Triglycerides: 115 mg/dL (ref 0.0–149.0)
VLDL: 23 mg/dL (ref 0.0–40.0)

## 2020-08-22 ENCOUNTER — Telehealth (INDEPENDENT_AMBULATORY_CARE_PROVIDER_SITE_OTHER): Payer: Medicare PPO | Admitting: Internal Medicine

## 2020-08-22 ENCOUNTER — Encounter: Payer: Self-pay | Admitting: Internal Medicine

## 2020-08-22 DIAGNOSIS — I70211 Atherosclerosis of native arteries of extremities with intermittent claudication, right leg: Secondary | ICD-10-CM

## 2020-08-22 DIAGNOSIS — E279 Disorder of adrenal gland, unspecified: Secondary | ICD-10-CM

## 2020-08-22 DIAGNOSIS — E278 Other specified disorders of adrenal gland: Secondary | ICD-10-CM | POA: Diagnosis not present

## 2020-08-22 DIAGNOSIS — M810 Age-related osteoporosis without current pathological fracture: Secondary | ICD-10-CM

## 2020-08-22 DIAGNOSIS — E785 Hyperlipidemia, unspecified: Secondary | ICD-10-CM | POA: Diagnosis not present

## 2020-08-22 DIAGNOSIS — I700 Atherosclerosis of aorta: Secondary | ICD-10-CM

## 2020-08-22 DIAGNOSIS — F439 Reaction to severe stress, unspecified: Secondary | ICD-10-CM | POA: Diagnosis not present

## 2020-08-22 NOTE — Progress Notes (Signed)
Patient ID: Toni Gardner, female   DOB: October 30, 1951, 69 y.o.   MRN: [id number]   Virtual Visit via video Note  This visit type was conducted due to national recommendations for restrictions regarding the COVID-19 pandemic (e.g. social distancing).  This format is felt to be most appropriate for this patient at this time.  All issues noted in this document were discussed and addressed.  No physical exam was performed (except for noted visual exam findings with Video Visits).   I connected with Katerin Negrete by a video enabled telemedicine application and verified that I am speaking with the correct person using two identifiers. Location patient: home Location provider: work  Persons participating in the virtual visit: patient, provider  The limitations, risks, security and privacy concerns of performing an evaluation and management service by video and the availability of in person appointments have been discussed.  It has also been discussed with the patient that there may be a patient responsible charge related to this service. The patient expressed understanding and agreed to proceed.   Reason for visit: scheduled follow up.    HPI: Has been seeing [facility].  Is s/p angiogram with intervention - improvement in lower extremity symptoms.  Tries to stay active.  No chest pain or sob.  No acid reflux or abdominal pain reported.  Bowels stable.  Last evaluated by endocrinology 04/2020 - f/u adrenal nodule.  Adrenal hormone function - ok.  Recommended f/u in 12 months.  Handling stress.     ROS: See pertinent positives and negatives per HPI.  Past Medical History:  Diagnosis Date  . Colon polyp   . Dermatitis   . Urine incontinence     Past Surgical History:  Procedure Laterality Date  . ABDOMINAL HYSTERECTOMY    . ADENOIDECTOMY    . COLONOSCOPY  06/28/2008   Dr Bary Castilla  . LOWER EXTREMITY ANGIOGRAPHY Right 05/09/2020   Procedure: LOWER EXTREMITY ANGIOGRAPHY;  Surgeon: Algernon Huxley, MD;  Location:  [facility];  Service: Cardiovascular;  Laterality: Right;    Family History  Problem Relation Age of Onset  . Heart disease Father   . Breast cancer Maternal Aunt   . Cancer Maternal Uncle        colon  . Colon polyps Sister     SOCIAL HX: reviewed.    Current Outpatient Medications:  .  alendronate (FOSAMAX) 70 MG tablet, Take 70 mg by mouth once a week., Disp: , Rfl:  .  ALPRAZolam (XANAX) 0.25 MG tablet, 1/2 tablet q day prn, Disp: 30 tablet, Rfl: 1 .  aspirin EC 81 MG tablet, Take 1 tablet (81 mg total) by mouth daily., Disp: 150 tablet, Rfl: 2 .  clopidogrel (PLAVIX) 75 MG tablet, Take 1 tablet (75 mg total) by mouth daily., Disp: 30 tablet, Rfl: 11 .  fluticasone (FLONASE) 50 MCG/ACT nasal spray, Place 2 sprays daily into both nostrils., Disp: 16 g, Rfl: 6 .  rosuvastatin (CRESTOR) 10 MG tablet, TAKE 1 TABLET BY MOUTH EVERY DAY, Disp: 90 tablet, Rfl: 2 .  sertraline (ZOLOFT) 50 MG tablet, TAKE 1 TABLET BY MOUTH EVERY DAY, Disp: 30 tablet, Rfl: 3 .  triamcinolone ointment (KENALOG) 0.1 %, , Disp: , Rfl:   EXAM:  GENERAL: alert, oriented, appears well and in no acute distress  HEENT: atraumatic, conjunttiva clear, no obvious abnormalities on inspection of external nose and ears  NECK: normal movements of the head and neck  LUNGS: on inspection no signs of respiratory distress,  breathing rate appears normal, no obvious gross SOB, gasping or wheezing  CV: no obvious cyanosis  PSYCH/NEURO: pleasant and cooperative, no obvious depression or anxiety, speech and thought processing grossly intact  ASSESSMENT AND PLAN:  Discussed the following assessment and plan:  Stress On zoloft.  Overall appears to be handling things well.  Follow.    Osteoporosis On fosamax.  Follow.   Hyperlipidemia On crestor.  Low cholesterol diet and exercise.  Follow lipid panel and liver function tests.    Atherosclerosis of native arteries of extremity with intermittent  claudication ([place]) Is s/p angiogram with intervention.  Leg symptoms improved.  Continue risk factor modification - plavix and crestor.    Aortic atherosclerosis (HCC) Continue crestor.   Adrenal nodule ([place]) Found as incidental finding on CT. Being followed by endocrinology.  Last evaluated 04/2019.  Stable.  Recommended f/u in 12 months.     Orders Placed This Encounter  Procedures  . Hepatic function panel    Standing Status:   Future    Standing Expiration Date:   08/31/2021  . Lipid panel    Standing Status:   Future    Standing Expiration Date:   08/31/2021  . Basic metabolic panel    Standing Status:   Future    Standing Expiration Date:   08/31/2021     I discussed the assessment and treatment plan with the patient. The patient was provided an opportunity to ask questions and all were answered. The patient agreed with the plan and demonstrated an understanding of the instructions.   The patient was advised to call back or seek an in-person evaluation if the symptoms worsen or if the condition fails to improve as anticipated.   Einar Pheasant, MD

## 2020-08-26 ENCOUNTER — Other Ambulatory Visit: Payer: Self-pay

## 2020-08-26 ENCOUNTER — Ambulatory Visit (INDEPENDENT_AMBULATORY_CARE_PROVIDER_SITE_OTHER): Payer: Medicare PPO

## 2020-08-26 DIAGNOSIS — Z23 Encounter for immunization: Secondary | ICD-10-CM

## 2020-08-31 ENCOUNTER — Encounter: Payer: Self-pay | Admitting: Internal Medicine

## 2020-08-31 NOTE — Assessment & Plan Note (Signed)
On zoloft.  Overall appears to be handling things well.  Follow.   

## 2020-08-31 NOTE — Assessment & Plan Note (Signed)
On fosamax.  Follow  

## 2020-08-31 NOTE — Assessment & Plan Note (Signed)
Found as incidental finding on CT. Being followed by endocrinology.  Last evaluated 04/2019.  Stable.  Recommended f/u in 12 months.

## 2020-08-31 NOTE — Assessment & Plan Note (Signed)
Is s/p angiogram with intervention.  Leg symptoms improved.  Continue risk factor modification - plavix and crestor.

## 2020-08-31 NOTE — Assessment & Plan Note (Signed)
Continue crestor 

## 2020-08-31 NOTE — Assessment & Plan Note (Signed)
On crestor.  Low cholesterol diet and exercise.  Follow lipid panel and liver function tests.   

## 2020-09-13 ENCOUNTER — Other Ambulatory Visit: Payer: Self-pay | Admitting: Internal Medicine

## 2020-09-17 ENCOUNTER — Ambulatory Visit (INDEPENDENT_AMBULATORY_CARE_PROVIDER_SITE_OTHER): Payer: Medicare PPO | Admitting: Vascular Surgery

## 2020-09-17 ENCOUNTER — Other Ambulatory Visit: Payer: Self-pay

## 2020-09-17 ENCOUNTER — Ambulatory Visit (INDEPENDENT_AMBULATORY_CARE_PROVIDER_SITE_OTHER): Payer: Medicare PPO

## 2020-09-17 ENCOUNTER — Encounter (INDEPENDENT_AMBULATORY_CARE_PROVIDER_SITE_OTHER): Payer: Self-pay | Admitting: Vascular Surgery

## 2020-09-17 VITALS — BP 128/77 | HR 83 | Resp 16 | Wt 104.6 lb

## 2020-09-17 DIAGNOSIS — I70211 Atherosclerosis of native arteries of extremities with intermittent claudication, right leg: Secondary | ICD-10-CM

## 2020-09-17 DIAGNOSIS — E785 Hyperlipidemia, unspecified: Secondary | ICD-10-CM

## 2020-09-17 NOTE — Progress Notes (Signed)
MRN : [id number]  Toni Gardner is a 69 y.o. (September 05, 1951) female who presents with chief complaint of  Chief Complaint  Patient presents with  . Follow-up    ultrasound follow up  .  History of Present Illness: Patient returns today in follow up of her PAD.  For 5 months ago she underwent right lower extremity revascularization for disabling claudication symptoms.  Since that time, her legs have done well.  She currently does not have any significant claudication, rest pain, or ulceration.  ABIs today are 1.1 on the right and 1.08 on the left with brisk triphasic waveforms and normal digital pressures consistent with no arterial insufficiency.  Current Outpatient Medications  Medication Sig Dispense Refill  . alendronate (FOSAMAX) 70 MG tablet Take 70 mg by mouth once a week.    . ALPRAZolam (XANAX) 0.25 MG tablet 1/2 tablet q day prn 30 tablet 1  . aspirin EC 81 MG tablet Take 1 tablet (81 mg total) by mouth daily. 150 tablet 2  . clopidogrel (PLAVIX) 75 MG tablet Take 1 tablet (75 mg total) by mouth daily. 30 tablet 11  . rosuvastatin (CRESTOR) 10 MG tablet TAKE 1 TABLET BY MOUTH EVERY DAY 90 tablet 2  . sertraline (ZOLOFT) 50 MG tablet TAKE 1 TABLET BY MOUTH EVERY DAY 30 tablet 3  . triamcinolone ointment (KENALOG) 0.1 %     . fluticasone (FLONASE) 50 MCG/ACT nasal spray Place 2 sprays daily into both nostrils. 16 g 6   No current facility-administered medications for this visit.    Past Medical History:  Diagnosis Date  . Colon polyp   . Dermatitis   . Urine incontinence     Past Surgical History:  Procedure Laterality Date  . ABDOMINAL HYSTERECTOMY    . ADENOIDECTOMY    . COLONOSCOPY  06/28/2008   Dr Bary Castilla  . LOWER EXTREMITY ANGIOGRAPHY Right 05/09/2020   Procedure: LOWER EXTREMITY ANGIOGRAPHY;  Surgeon: Algernon Huxley, MD;  Location: [facility];  Service: Cardiovascular;  Laterality: Right;     Social History   Tobacco Use  . Smoking status: Former  Smoker    Years: 20.00    Types: Cigarettes    Quit date: 04/11/2020    Years since quitting: 0.4  . Smokeless tobacco: Never Used  Vaping Use  . Vaping Use: Never used  Substance Use Topics  . Alcohol use: Yes  . Drug use: No      Family History  Problem Relation Age of Onset  . Heart disease Father   . Breast cancer Maternal Aunt   . Cancer Maternal Uncle        colon  . Colon polyps Sister      Allergies  Allergen Reactions  . Sulfa Antibiotics Swelling    REVIEW OF SYSTEMS (Negative unless checked)  Constitutional: [] ?Weight loss  [] ?Fever  [] ?Chills Cardiac: [] ?Chest pain   [] ?Chest pressure   [] ?Palpitations   [] ?Shortness of breath when laying flat   [] ?Shortness of breath at rest   [] ?Shortness of breath with exertion. Vascular:  [] ?Pain in legs with walking   [] ?Pain in legs at rest   [] ?Pain in legs when laying flat   [] ?Claudication   [] ?Pain in feet when walking  [] ?Pain in feet at rest  [] ?Pain in feet when laying flat   [] ?History of DVT   [] ?Phlebitis   [] ?Swelling in legs   [] ?Varicose veins   [] ?Non-healing ulcers Pulmonary:   [] ?Uses home oxygen   [] ?  Productive cough   [] ?Hemoptysis   [] ?Wheeze  [] ?COPD   [] ?Asthma Neurologic:  [] ?Dizziness  [] ?Blackouts   [] ?Seizures   [] ?History of stroke   [] ?History of TIA  [] ?Aphasia   [] ?Temporary blindness   [] ?Dysphagia   [] ?Weakness or numbness in arms   [] ?Weakness or numbness in legs Musculoskeletal:  [x] ?Arthritis   [] ?Joint swelling   [] ?Joint pain   [] ?Low back pain Hematologic:  [] ?Easy bruising  [] ?Easy bleeding   [] ?Hypercoagulable state   [] ?Anemic   Gastrointestinal:  [] ?Blood in stool   [] ?Vomiting blood  [] ?Gastroesophageal reflux/heartburn   [x] ?Abdominal pain Genitourinary:  [] ?Chronic kidney disease   [x] ?Difficult urination  [] ?Frequent urination  [] ?Burning with urination   [] ?Hematuria Skin:  [] ?Rashes   [] ?Ulcers   [] ?Wounds Psychological:  [] ?History of anxiety   [] ? History of major  depression.  Physical Examination  BP 128/77 (BP Location: Right Arm)   Pulse 83   Resp 16   Wt 104 lb 9.6 oz (47.4 kg)   BMI 20.43 kg/m  Gen:  WD/WN, NAD Head: [place]/AT, No temporalis wasting. Ear/Nose/Throat: Hearing grossly intact, nares w/o erythema or drainage Eyes: Conjunctiva clear. Sclera non-icteric Neck: Supple.  Trachea midline Pulmonary:  Good air movement, no use of accessory muscles.  Cardiac: RRR, no JVD Vascular:  Vessel Right Left  Radial Palpable Palpable                          PT Palpable Palpable  DP Palpable Palpable   Gastrointestinal: soft, non-tender/non-distended. No guarding/reflex.  Musculoskeletal: M/S 5/5 throughout.  No deformity or atrophy. No edema. Neurologic: Sensation grossly intact in extremities.  Symmetrical.  Speech is fluent.  Psychiatric: Judgment intact, Mood & affect appropriate for pt's clinical situation. Dermatologic: No rashes or ulcers noted.  No cellulitis or open wounds.       Labs Recent Results (from the past 2160 hour(s))  Basic metabolic panel     Status: None   Collection Time: 08/13/20  8:13 AM  Result Value Ref Range   Sodium 139 135 - 145 mEq/L   Potassium 4.7 3.5 - 5.1 mEq/L   Chloride 102 96 - 112 mEq/L   CO2 27 19 - 32 mEq/L   Glucose, Bld 86 70 - 99 mg/dL   BUN 16 6 - 23 mg/dL   Creatinine, Ser 0.75 0.40 - 1.20 mg/dL   GFR 76.18 >60.00 mL/min   Calcium 9.6 8.4 - 10.5 mg/dL  Lipid panel     Status: None   Collection Time: 08/13/20  8:13 AM  Result Value Ref Range   Cholesterol 177 0 - 200 mg/dL    Comment: ATP III Classification       Desirable:  < 200 mg/dL               Borderline High:  200 - 239 mg/dL          High:  > = 240 mg/dL   Triglycerides 115.0 0 - 149 mg/dL    Comment: Normal:  <150 mg/dLBorderline High:  150 - 199 mg/dL   HDL 107.20 >39.00 mg/dL   VLDL 23.0 0.0 - 40.0 mg/dL   LDL Cholesterol 47 0 - 99 mg/dL   Total CHOL/HDL Ratio 2     Comment:                Men           Women1/2 Average Risk     3.4  3.3Average Risk          5.0          4.42X Average Risk          9.6          7.13X Average Risk          15.0          11.0                       NonHDL 69.95     Comment: NOTE:  Non-HDL goal should be 30 mg/dL higher than patient's LDL goal (i.e. LDL goal of < 70 mg/dL, would have non-HDL goal of < 100 mg/dL)  Hepatic function panel     Status: None   Collection Time: 08/13/20  8:13 AM  Result Value Ref Range   Total Bilirubin 0.3 0.2 - 1.2 mg/dL   Bilirubin, Direct 0.1 0.0 - 0.3 mg/dL   Alkaline Phosphatase 55 39 - 117 U/L   AST 22 0 - 37 U/L   ALT 21 0 - 35 U/L   Total Protein 7.3 6.0 - 8.3 g/dL   Albumin 4.5 3.5 - 5.2 g/dL    Radiology No results found.  Assessment/Plan Hyperlipidemia lipid control important in reducing the progression of atherosclerotic disease. Continue statin therapy  Atherosclerosis of native arteries of extremity with intermittent claudication (HCC) ABIs today are 1.1 on the right and 1.08 on the left with brisk triphasic waveforms and normal digital pressures consistent with no arterial insufficiency.  Doing well after revascularization.  Continue Plavix and statin agent.  Recheck in 6 months with noninvasive studies.    Leotis Pain, MD  09/17/2020 3:03 PM    This note was created with Dragon medical transcription system.  Any errors from dictation are purely unintentional

## 2020-09-17 NOTE — Patient Instructions (Signed)
Peripheral Vascular Disease  Peripheral vascular disease (PVD) is a disease of the blood vessels that are not part of your heart and brain. A simple term for PVD is poor circulation. In most cases, PVD narrows the blood vessels that carry blood from your heart to the rest of your body. This can reduce the supply of blood to your arms, legs, and internal organs, like your stomach or kidneys. However, PVD most often affects a person's lower legs and feet. Without treatment, PVD tends to get worse. PVD can also lead to acute ischemic limb. This is when an arm or leg suddenly cannot get enough blood. This is a medical emergency. Follow these instructions at home: Lifestyle  Do not use any products that contain nicotine or tobacco, such as cigarettes and e-cigarettes. If you need help quitting, ask your doctor.  Lose weight if you are overweight. Or, stay at a healthy weight as told by your doctor.  Eat a diet that is low in fat and cholesterol. If you need help, ask your doctor.  Exercise regularly. Ask your doctor for activities that are right for you. General instructions  Take over-the-counter and prescription medicines only as told by your doctor.  Take good care of your feet: ? Wear comfortable shoes that fit well. ? Check your feet often for any cuts or sores.  Keep all follow-up visits as told by your doctor This is important. Contact a doctor if:  You have cramps in your legs when you walk.  You have leg pain when you are at rest.  You have coldness in a leg or foot.  Your skin changes.  You are unable to get or have an erection (erectile dysfunction).  You have cuts or sores on your feet that do not heal. Get help right away if:  Your arm or leg turns cold, numb, and blue.  Your arms or legs become red, warm, swollen, painful, or numb.  You have chest pain.  You have trouble breathing.  You suddenly have weakness in your face, arm, or leg.  You become very  confused or you cannot speak.  You suddenly have a very bad headache.  You suddenly cannot see. Summary  Peripheral vascular disease (PVD) is a disease of the blood vessels.  A simple term for PVD is poor circulation. Without treatment, PVD tends to get worse.  Treatment may include exercise, low fat and low cholesterol diet, and quitting smoking. This information is not intended to replace advice given to you by your health care provider. Make sure you discuss any questions you have with your health care provider. Document Revised: 11/05/2017 Document Reviewed: 12/31/2016 Elsevier Patient Education  2020 [employer]  

## 2020-09-17 NOTE — Assessment & Plan Note (Signed)
ABIs today are 1.1 on the right and 1.08 on the left with brisk triphasic waveforms and normal digital pressures consistent with no arterial insufficiency.  Doing well after revascularization.  Continue Plavix and statin agent.  Recheck in 6 months with noninvasive studies.

## 2020-10-21 DIAGNOSIS — H40013 Open angle with borderline findings, low risk, bilateral: Secondary | ICD-10-CM | POA: Diagnosis not present

## 2020-10-21 DIAGNOSIS — H26493 Other secondary cataract, bilateral: Secondary | ICD-10-CM | POA: Diagnosis not present

## 2020-10-21 DIAGNOSIS — Z961 Presence of intraocular lens: Secondary | ICD-10-CM | POA: Diagnosis not present

## 2020-10-21 DIAGNOSIS — H04129 Dry eye syndrome of unspecified lacrimal gland: Secondary | ICD-10-CM | POA: Diagnosis not present

## 2020-12-06 ENCOUNTER — Other Ambulatory Visit: Payer: Self-pay | Admitting: Internal Medicine

## 2020-12-24 ENCOUNTER — Other Ambulatory Visit: Payer: Medicare PPO

## 2020-12-24 ENCOUNTER — Telehealth: Payer: Self-pay | Admitting: Internal Medicine

## 2020-12-24 ENCOUNTER — Telehealth (INDEPENDENT_AMBULATORY_CARE_PROVIDER_SITE_OTHER): Payer: Medicare PPO | Admitting: Internal Medicine

## 2020-12-24 ENCOUNTER — Encounter: Payer: Self-pay | Admitting: *Deleted

## 2020-12-24 ENCOUNTER — Encounter: Payer: Self-pay | Admitting: Internal Medicine

## 2020-12-24 VITALS — Ht 60.0 in | Wt 104.0 lb

## 2020-12-24 DIAGNOSIS — R0981 Nasal congestion: Secondary | ICD-10-CM | POA: Diagnosis not present

## 2020-12-24 DIAGNOSIS — R059 Cough, unspecified: Secondary | ICD-10-CM | POA: Diagnosis not present

## 2020-12-24 NOTE — Telephone Encounter (Signed)
My chart message sent to pt for update.   

## 2020-12-24 NOTE — Progress Notes (Signed)
Patient ID: Toni Gardner, female   DOB: 1951/07/31, 70 y.o.   MRN: [id number]   Virtual Visit via video Note  This visit type was conducted due to national recommendations for restrictions regarding the COVID-19 pandemic (e.g. social distancing).  This format is felt to be most appropriate for this patient at this time.  All issues noted in this document were discussed and addressed.  No physical exam was performed (except for noted visual exam findings with Video Visits).   I connected with Toni Gardner by a video enabled telemedicine application and verified that I am speaking with the correct person using two identifiers. Location patient: home Location provider: home ofice Persons participating in the virtual visit: patient, provider  The limitations, risks, security and privacy concerns of performing an evaluation and management service by video and the availability of in person appointments have been discussed.  It has also been discussed with the patient that there may be a patient responsible charge related to this service. The patient expressed understanding and agreed to proceed.   Reason for visit: work in appt  HPI: Work in with concerns regarding sore throat, aching and congestion.  States symptoms started 12/21/20.  Reported sore throat and fever 100.8 initially.  Also body aches.  Increased nasal congestion.  Clear mucus.  Ears stopped up.  Some cough.  Not keeping her awake.  Sore throat is better now.  No chest pain, chest tightness or sob.  No chest congestion.  Eating and drinking.  No nausea or vomiting. No diarrhea.  No LOT or LOS.  Taking tylenol.  Discussed covid testing. Agreeable.     ROS: See pertinent positives and negatives per HPI.  Past Medical History:  Diagnosis Date   Colon polyp    Dermatitis    Urine incontinence     Past Surgical History:  Procedure Laterality Date   ABDOMINAL HYSTERECTOMY     ADENOIDECTOMY     COLONOSCOPY  06/28/2008   Dr  Bary Castilla   LOWER EXTREMITY ANGIOGRAPHY Right 05/09/2020   Procedure: LOWER EXTREMITY ANGIOGRAPHY;  Surgeon: Algernon Huxley, MD;  Location: [facility];  Service: Cardiovascular;  Laterality: Right;    Family History  Problem Relation Age of Onset   Heart disease Father    Breast cancer Maternal Aunt    Cancer Maternal Uncle        colon   Colon polyps Sister     SOCIAL HX: reviewed.    Current Outpatient Medications:    alendronate (FOSAMAX) 70 MG tablet, Take 70 mg by mouth once a week., Disp: , Rfl:    ALPRAZolam (XANAX) 0.25 MG tablet, 1/2 tablet q day prn, Disp: 30 tablet, Rfl: 1   aspirin EC 81 MG tablet, Take 1 tablet (81 mg total) by mouth daily., Disp: 150 tablet, Rfl: 2   clopidogrel (PLAVIX) 75 MG tablet, Take 1 tablet (75 mg total) by mouth daily., Disp: 30 tablet, Rfl: 11   rosuvastatin (CRESTOR) 10 MG tablet, TAKE 1 TABLET BY MOUTH EVERY DAY, Disp: 90 tablet, Rfl: 2   sertraline (ZOLOFT) 50 MG tablet, TAKE 1 TABLET BY MOUTH EVERY DAY, Disp: 30 tablet, Rfl: 3   triamcinolone ointment (KENALOG) 0.1 %, , Disp: , Rfl:   EXAM:  GENERAL: alert, oriented, appears well and in no acute distress  HEENT: atraumatic, conjunttiva clear, no obvious abnormalities on inspection of external nose and ears  NECK: normal movements of the head and neck  LUNGS: on inspection no signs  of respiratory distress, breathing rate appears normal, no obvious gross SOB, gasping or wheezing  CV: no obvious cyanosis  PSYCH/NEURO: pleasant and cooperative, no obvious depression or anxiety, speech and thought processing grossly intact  ASSESSMENT AND PLAN:  Discussed the following assessment and plan:  Problem List Items Addressed This Visit    Congestion of nasal sinus    Increased nasal congestion, fever and previous sore throat.  Minimal cough.  Discussed possible covid.  Schedule for nasal swab.  Treat with saline nasal spray and steroid nasal spray as directed.  Robitussin  DM / mucinex as directed.  Follow closely.  Discussed quarantine.  Call with update.         Other Visit Diagnoses    Cough    -  Primary   Relevant Orders   Novel Coronavirus, NAA ([facility]) (Completed)       I discussed the assessment and treatment plan with the patient. The patient was provided an opportunity to ask questions and all were answered. The patient agreed with the plan and demonstrated an understanding of the instructions.   The patient was advised to call back or seek an in-person evaluation if the symptoms worsen or if the condition fails to improve as anticipated.   Einar Pheasant, MD

## 2020-12-26 ENCOUNTER — Ambulatory Visit: Payer: Medicare PPO | Admitting: Internal Medicine

## 2020-12-26 LAB — SARS-COV-2, NAA 2 DAY TAT

## 2020-12-26 LAB — NOVEL CORONAVIRUS, NAA: SARS-CoV-2, NAA: DETECTED — AB

## 2020-12-27 ENCOUNTER — Telehealth (HOSPITAL_COMMUNITY): Payer: Self-pay | Admitting: Family

## 2020-12-27 ENCOUNTER — Encounter (HOSPITAL_COMMUNITY): Payer: Self-pay | Admitting: Family

## 2020-12-27 NOTE — Progress Notes (Signed)
erroneous

## 2020-12-27 NOTE — Telephone Encounter (Addendum)
Called to discuss with [name] about Covid symptoms and the use of antiviral therapy or  monoclonal antibody infusion for those with mild to moderate Covid symptoms and at a high risk of hospitalization.     Pt is qualified for antiviral or MAB therapy at this time due to co-morbid conditions and/or a member of an at-risk group, however declines infusion at this time as she states she is feeling much better. Symptoms tier reviewed as well as criteria for ending isolation.  Symptoms reviewed that would warrant ED/Hospital evaluation. Preventative practices reviewed. Patient verbalized understanding.    Patient Active Problem List   Diagnosis Date Noted  . Osteoarthritis of knee 06/13/2020  . Atherosclerosis of native arteries of extremity with intermittent claudication ([place]) 04/19/2020  . Rash 01/24/2020  . Hyperlipidemia 04/18/2019  . Aortic atherosclerosis ([place]) 03/22/2019  . Adrenal nodule ([place]) 03/22/2019  . Healthcare maintenance 02/26/2019  . Stress 02/26/2019  . Abdominal pain 02/23/2019  . Recurrent sinusitis 10/16/2017  . Encounter for screening colonoscopy 12/11/2016  . History of abnormal cervical Pap smear 09/20/2016  . History of colonic polyps 09/20/2016  . Osteoporosis 09/20/2016    Toni Viscardi,NP

## 2020-12-29 ENCOUNTER — Encounter: Payer: Self-pay | Admitting: Internal Medicine

## 2020-12-29 DIAGNOSIS — R0981 Nasal congestion: Secondary | ICD-10-CM | POA: Insufficient documentation

## 2020-12-29 NOTE — Assessment & Plan Note (Signed)
Increased nasal congestion, fever and previous sore throat.  Minimal cough.  Discussed possible covid.  Schedule for nasal swab.  Treat with saline nasal spray and steroid nasal spray as directed.  Robitussin DM / mucinex as directed.  Follow closely.  Discussed quarantine.  Call with update.

## 2021-01-01 ENCOUNTER — Other Ambulatory Visit: Payer: Self-pay | Admitting: Internal Medicine

## 2021-01-06 ENCOUNTER — Other Ambulatory Visit: Payer: Medicare PPO

## 2021-01-26 ENCOUNTER — Other Ambulatory Visit: Payer: Self-pay | Admitting: Internal Medicine

## 2021-01-27 ENCOUNTER — Other Ambulatory Visit (INDEPENDENT_AMBULATORY_CARE_PROVIDER_SITE_OTHER): Payer: Medicare PPO

## 2021-01-27 ENCOUNTER — Other Ambulatory Visit: Payer: Self-pay

## 2021-01-27 DIAGNOSIS — E785 Hyperlipidemia, unspecified: Secondary | ICD-10-CM

## 2021-01-27 LAB — BASIC METABOLIC PANEL
BUN: 19 mg/dL (ref 6–23)
CO2: 26 mEq/L (ref 19–32)
Calcium: 9.6 mg/dL (ref 8.4–10.5)
Chloride: 103 mEq/L (ref 96–112)
Creatinine, Ser: 0.88 mg/dL (ref 0.40–1.20)
GFR: 66.11 mL/min (ref >60.00)
Glucose, Bld: 96 mg/dL (ref 70–99)
Potassium: 4.5 mEq/L (ref 3.5–5.1)
Sodium: 139 mEq/L (ref 135–145)

## 2021-01-27 LAB — HEPATIC FUNCTION PANEL
ALT: 23 U/L (ref 0–35)
AST: 22 U/L (ref 0–37)
Albumin: 4.2 g/dL (ref 3.5–5.2)
Alkaline Phosphatase: 68 U/L (ref 39–117)
Bilirubin, Direct: 0.1 mg/dL (ref 0.0–0.3)
Total Bilirubin: 0.4 mg/dL (ref 0.2–1.2)
Total Protein: 6.8 g/dL (ref 6.0–8.3)

## 2021-01-27 LAB — LIPID PANEL
Cholesterol: 177 mg/dL (ref 0–200)
HDL: 102 mg/dL (ref >39.00)
LDL Cholesterol: 59 mg/dL (ref 0–99)
NonHDL: 74.65
Total CHOL/HDL Ratio: 2
Triglycerides: 76 mg/dL (ref 0.0–149.0)
VLDL: 15.2 mg/dL (ref 0.0–40.0)

## 2021-03-18 ENCOUNTER — Ambulatory Visit (INDEPENDENT_AMBULATORY_CARE_PROVIDER_SITE_OTHER): Payer: Medicare PPO | Admitting: Nurse Practitioner

## 2021-03-18 ENCOUNTER — Ambulatory Visit (INDEPENDENT_AMBULATORY_CARE_PROVIDER_SITE_OTHER): Payer: Medicare PPO

## 2021-03-18 ENCOUNTER — Other Ambulatory Visit: Payer: Self-pay

## 2021-03-18 DIAGNOSIS — I70211 Atherosclerosis of native arteries of extremities with intermittent claudication, right leg: Secondary | ICD-10-CM

## 2021-03-26 ENCOUNTER — Encounter (INDEPENDENT_AMBULATORY_CARE_PROVIDER_SITE_OTHER): Payer: Self-pay | Admitting: *Deleted

## 2021-04-26 ENCOUNTER — Other Ambulatory Visit (INDEPENDENT_AMBULATORY_CARE_PROVIDER_SITE_OTHER): Payer: Self-pay | Admitting: Vascular Surgery

## 2021-04-30 DIAGNOSIS — E278 Other specified disorders of adrenal gland: Secondary | ICD-10-CM | POA: Diagnosis not present

## 2021-05-01 DIAGNOSIS — E278 Other specified disorders of adrenal gland: Secondary | ICD-10-CM | POA: Diagnosis not present

## 2021-05-15 ENCOUNTER — Other Ambulatory Visit: Payer: Self-pay | Admitting: Internal Medicine

## 2021-05-15 DIAGNOSIS — Z1231 Encounter for screening mammogram for malignant neoplasm of breast: Secondary | ICD-10-CM

## 2021-05-23 ENCOUNTER — Ambulatory Visit: Payer: Medicare PPO

## 2021-05-26 ENCOUNTER — Ambulatory Visit: Payer: Medicare PPO

## 2021-05-29 ENCOUNTER — Ambulatory Visit (INDEPENDENT_AMBULATORY_CARE_PROVIDER_SITE_OTHER): Payer: Medicare PPO

## 2021-05-29 VITALS — Ht 60.0 in | Wt 104.0 lb

## 2021-05-29 DIAGNOSIS — Z0000 Encounter for general adult medical examination without abnormal findings: Secondary | ICD-10-CM

## 2021-05-29 NOTE — Progress Notes (Signed)
Subjective:   Toni Gardner is a 70 y.o. female who presents for Medicare Annual (Subsequent) preventive examination.  Review of Systems    No ROS.  Medicare Wellness Virtual Visit.  Visual/audio telehealth visit, UTA vital signs.   See social history for additional risk factors.   Cardiac Risk Factors include: advanced age (>76men, >55 women)     Objective:    Today's Vitals   05/29/21 1535  Weight: 104 lb (47.2 kg)  Height: 5' (1.524 m)   Body mass index is 20.31 kg/m.  Advanced Directives 05/29/2021 05/22/2020 05/09/2020 10/03/2017  Does Patient Have a Medical Advance Directive? No No No No  Would patient like information on creating a medical advance directive? No - Patient declined No - Patient declined Yes (MAU/Ambulatory/Procedural Areas - Information given) -    Current Medications (verified) Outpatient Encounter Medications as of 05/29/2021  Medication Sig   alendronate (FOSAMAX) 70 MG tablet Take 70 mg by mouth once a week.   ALPRAZolam (XANAX) 0.25 MG tablet 1/2 tablet q day prn   aspirin EC 81 MG tablet Take 1 tablet (81 mg total) by mouth daily.   clopidogrel (PLAVIX) 75 MG tablet TAKE 1 TABLET BY MOUTH EVERY DAY   rosuvastatin (CRESTOR) 10 MG tablet TAKE 1 TABLET BY MOUTH EVERY DAY   sertraline (ZOLOFT) 50 MG tablet TAKE 1 TABLET BY MOUTH EVERY DAY   triamcinolone ointment (KENALOG) 0.1 %    No facility-administered encounter medications on file as of 05/29/2021.    Allergies (verified) Sulfa antibiotics   History: Past Medical History:  Diagnosis Date   Colon polyp    Dermatitis    Urine incontinence    Past Surgical History:  Procedure Laterality Date   ABDOMINAL HYSTERECTOMY     ADENOIDECTOMY     COLONOSCOPY  06/28/2008   Dr Bary Castilla   LOWER EXTREMITY ANGIOGRAPHY Right 05/09/2020   Procedure: LOWER EXTREMITY ANGIOGRAPHY;  Surgeon: Algernon Huxley, MD;  Location: [facility];  Service: Cardiovascular;  Laterality: Right;   Family History   Problem Relation Age of Onset   Heart disease Father    Breast cancer Maternal Aunt    Cancer Maternal Uncle        colon   Colon polyps Sister    Social History   Socioeconomic History   Marital status: Married    Spouse name: Not on file   Number of children: Not on file   Years of education: Not on file   Highest education level: Not on file  Occupational History   Not on file  Tobacco Use   Smoking status: Former    Years: 20.00    Pack years: 0.00    Types: Cigarettes    Quit date: 04/11/2020    Years since quitting: 1.1   Smokeless tobacco: Never  Vaping Use   Vaping Use: Never used  Substance and Sexual Activity   Alcohol use: Yes   Drug use: No   Sexual activity: Not on file  Other Topics Concern   Not on file  Social History Narrative   Not on file   Social Determinants of Health   Financial Resource Strain: Not on file  Food Insecurity: No Food Insecurity   Worried About Toni Gardner in the Last Year: Never true   [place] in the Last Year: Never true  Transportation Needs: No Transportation Needs   Lack of Transportation (Medical): No   Lack of Transportation (Non-Medical):  No  Physical Activity: Not on file  Stress: No Stress Concern Present   Feeling of Stress : Not at all  Social Connections: Not on file    Tobacco Counseling Counseling given: Not Answered   Clinical Intake:  Pre-visit preparation completed: Yes        Diabetes: No             Activities of Daily Living In your present state of health, do you have any difficulty performing the following activities: 05/29/2021  Hearing? N  Vision? N  Difficulty concentrating or making decisions? N  Walking or climbing stairs? N  Dressing or bathing? N  Doing errands, shopping? N  Preparing Food and eating ? N  Using the Toilet? N  In the past six months, have you accidently leaked urine? N  Do you have problems with loss of bowel control? N  Managing your  Medications? N  Managing your Finances? N  Housekeeping or managing your Housekeeping? N  Some recent data might be hidden    Patient Care Team: Einar Pheasant, MD as PCP - General (Internal Medicine)  Indicate any recent Medical Services you may have received from other than [facility] providers in the past year (date may be approximate).     Assessment:   This is a routine wellness examination for Toni Gardner.  I connected with [name] today by telephone and verified that I am speaking with the correct person using two identifiers. Location patient: home Location provider: work Persons participating in the virtual visit: patient, Marine scientist.    I discussed the limitations, risks, security and privacy concerns of performing an evaluation and management service by telephone and the availability of in person appointments. The patient expressed understanding and verbally consented to this telephonic visit.    Interactive audio and video telecommunications were attempted between this provider and patient, however failed, due to patient having technical difficulties OR patient did not have access to video capability.  We continued and completed visit with audio only.  Some vital signs may be absent or patient reported.   Hearing/Vision screen Hearing Screening - Comments:: Patient is able to hear conversational tones without difficulty.  No issues reported. Vision Screening - Comments:: Visual acuity not assessed, virtual visit.  They have seen their ophthalmologist in the last 12 months.    Dietary issues and exercise activities discussed: Current Exercise Habits: Home exercise routine, Intensity: Mild Healthy diet Good water intake   Goals Addressed               This Visit's Progress     Patient Stated     I'd like to stay active walking and could increase water intake. (pt-stated)   On track      Depression Screen PHQ 2/9 Scores 05/29/2021 05/22/2020 08/17/2019 10/13/2017 09/15/2016   PHQ - 2 Score 0 0 0 1 0  PHQ- 9 Score - - - 4 -    Fall Risk Fall Risk  05/29/2021 12/24/2020 08/22/2020 05/22/2020 06/01/2019  Falls in the past year? 0 0 1 1 0  Comment - - - None new since last reported -  Number falls in past yr: 0 - 1 0 -  Injury with Fall? 0 - 1 - -  Risk for fall due to : - History of fall(s) - - -  Follow up Falls evaluation completed Falls evaluation completed Falls evaluation completed Falls evaluation completed -    FALL RISK PREVENTION PERTAINING TO THE HOME: Handrails in use when climbing  stairs? Yes Home free of loose throw rugs in walkways, pet beds, electrical cords, etc? Yes  Adequate lighting in your home to reduce risk of falls? Yes   ASSISTIVE DEVICES UTILIZED TO PREVENT FALLS: Life alert? No  Use of a cane, walker or w/c? No   TIMED UP AND GO: Was the test performed? No . Virtual visit.   Cognitive Function:  Patient is alert and oriented x3.  Denies difficulty focusing, making decisions, memory loss.  Enjoys brain health activities.  MMSE/6CIT deferred. Normal by direct communication/observation.    6CIT Screen 05/22/2020  What Year? 0 points  What month? 0 points  Months in reverse 0 points  Repeat phrase 0 points    Immunizations Immunization History  Administered Date(s) Administered   Fluad Quad(high Dose 65+) 09/01/2019, 08/26/2020   Influenza, High Dose Seasonal PF 08/25/2018   PFIZER(Purple Top)SARS-COV-2 Vaccination 01/17/2020, 02/07/2020, 09/23/2020   Pneumococcal Conjugate-13 08/05/2017   Pneumococcal Polysaccharide-23 09/08/2018    TDAP status: Due, Education has been provided regarding the importance of this vaccine. Advised may receive this vaccine at local pharmacy or Health Dept. Aware to provide a copy of the vaccination record if obtained from local pharmacy or Health Dept. Verbalized acceptance and understanding. Deferred.   Zostavax completed Yes   Shingrix Completed?: No.    Education has been provided regarding  the importance of this vaccine. Patient has been advised to call insurance company to determine out of pocket expense if they have not yet received this vaccine. Advised may also receive vaccine at local pharmacy or Health Dept. Verbalized acceptance and understanding.   Health Maintenance There are no preventive care reminders to display for this patient. Health Maintenance  Topic Date Due   COVID-19 Vaccine (4 - Booster for Pfizer series) 06/14/2021 (Originally 01/24/2021)   Zoster Vaccines- Shingrix (1 of 2) 11/28/2021 (Originally 08/22/1999)   TETANUS/TDAP  05/29/2022 (Originally 08/21/1968)   Hepatitis C Screening  05/29/2022 (Originally 08/22/1967)   Fecal DNA (Cologuard)  10/17/2021   MAMMOGRAM  06/12/2022   DEXA SCAN  Completed   PNA vac Low Risk Adult  Completed   HPV VACCINES  Aged Out   INFLUENZA VACCINE  Discontinued   Mammogram- scheduled 06/30/21  Lung Cancer Screening: (Low Dose CT Chest recommended if Age 57-80 years, 30 pack-year currently smoking OR have quit w/in 15years.) does not qualify.   Vision Screening: Recommended annual ophthalmology exams for early detection of glaucoma and other disorders of the eye. Is the patient up to date with their annual eye exam?  Yes   Dental Screening: Recommended annual dental exams for proper oral hygiene.  Community Resource Referral / Chronic Care Management: CRR required this visit?  No   CCM required this visit?  No      Plan:   Keep all routine maintenance appointments.   I have personally reviewed and noted the following in the patient's chart:   Medical and social history Use of alcohol, tobacco or illicit drugs  Current medications and supplements including opioid prescriptions.  Functional ability and status Nutritional status Physical activity Advanced directives List of other physicians Hospitalizations, surgeries, and ER visits in previous 12 months Vitals Screenings to include cognitive, depression, and  falls Referrals and appointments  In addition, I have reviewed and discussed with patient certain preventive protocols, quality metrics, and best practice recommendations. A written personalized care plan for preventive services as well as general preventive health recommendations were provided to patient via mychart.     Arby Barrette,  Staceyann Knouff L, LPN   02/17/2766

## 2021-05-29 NOTE — Patient Instructions (Signed)
  Toni Gardner , Thank you for taking time to come for your Medicare Wellness Visit. I appreciate your ongoing commitment to your health goals. Please review the following plan we discussed and let me know if I can assist you in the future.   These are the goals we discussed:  Goals       Patient Stated     I'd like to stay active walking and could increase water intake. (pt-stated)        This is a list of the screening recommended for you and due dates:  Health Maintenance  Topic Date Due   COVID-19 Vaccine (4 - Booster for Pfizer series) 06/14/2021*   Zoster (Shingles) Vaccine (1 of 2) 11/28/2021*   Tetanus Vaccine  05/29/2022*   Hepatitis C Screening: USPSTF Recommendation to screen - Ages 18-79 yo.  05/29/2022*   Cologuard (Stool DNA test)  10/17/2021   Mammogram  06/12/2022   DEXA scan (bone density measurement)  Completed   Pneumonia vaccines  Completed   HPV Vaccine  Aged Out   Flu Shot  Discontinued  *Topic was postponed. The date shown is not the original due date.

## 2021-06-03 ENCOUNTER — Ambulatory Visit: Payer: Medicare PPO | Admitting: Dermatology

## 2021-06-03 ENCOUNTER — Encounter: Payer: Self-pay | Admitting: Dermatology

## 2021-06-03 ENCOUNTER — Other Ambulatory Visit: Payer: Self-pay

## 2021-06-03 DIAGNOSIS — R21 Rash and other nonspecific skin eruption: Secondary | ICD-10-CM | POA: Diagnosis not present

## 2021-06-03 DIAGNOSIS — B353 Tinea pedis: Secondary | ICD-10-CM | POA: Diagnosis not present

## 2021-06-03 DIAGNOSIS — L219 Seborrheic dermatitis, unspecified: Secondary | ICD-10-CM

## 2021-06-03 MED ORDER — CICLOPIROX OLAMINE 0.77 % EX CREA: TOPICAL_CREAM | Freq: Two times a day (BID) | CUTANEOUS | 3 refills | Status: DC

## 2021-06-03 NOTE — Patient Instructions (Addendum)
Start ciclopirox cream twice daily  x 4 days to hands then add Winzora once daily until run out then switch to [facility] 0.1% cream twice daily for up to 2 weeks. Avoid applying to face, groin, and axilla. Use as directed. Risk of skin atrophy with long-term use reviewed.   Start ciclopirox cream to feet and in between toes twice daily until clear, then one more week, then once weekly for maintenance.   Start TMC 0.1% cream twice daily to areas at ears for up to 2 weeks as needed for rash. Avoid applying to face, groin, and axilla. Use as directed. Risk of skin atrophy with long-term use reviewed.   Topical steroids (such as triamcinolone, fluocinolone, fluocinonide, mometasone, clobetasol, halobetasol, betamethasone, hydrocortisone) can cause thinning and lightening of the skin if they are used for too long in the same area. Your physician has selected the right strength medicine for your problem and area affected on the body. Please use your medication only as directed by your physician to prevent side effects.   Psoriasis is a chronic non-curable, but treatable genetic/hereditary disease that may have other systemic features affecting other organ systems such as joints (Psoriatic Arthritis). It is associated with an increased risk of inflammatory bowel disease, heart disease, non-alcoholic fatty liver disease, and depression.    If you have any questions or concerns for your doctor, please call our main line at [phone] and press option 4 to reach your doctor's medical assistant. If no one answers, please leave a voicemail as directed and we will return your call as soon as possible. Messages left after 4 pm will be answered the following business day.   You may also send [place] a message via [place]. We typically respond to MyChart messages within 1-2 business days.  For prescription refills, please ask your pharmacy to contact our office. Our fax number is [phone].  If you have an urgent issue  when the clinic is closed that cannot wait until the next business day, you can page your doctor at the number below.    Please note that while we do our best to be available for urgent issues outside of office hours, we are not available 24/7.   If you have an urgent issue and are unable to reach [place], you may choose to seek medical care at your doctor's office, retail clinic, urgent care center, or emergency room.  If you have a medical emergency, please immediately call 911 or go to the emergency department.  Pager Numbers  - Dr. Nehemiah Massed: [phone]  - Dr. Laurence Ferrari: [phone]  - Dr. Nicole Kindred: [phone]  In the event of inclement weather, please call our main line at [phone] for an update on the status of any delays or closures.  Dermatology Medication Tips: Please keep the boxes that topical medications come in in order to help keep track of the instructions about where and how to use these. Pharmacies typically print the medication instructions only on the boxes and not directly on the medication tubes.   If your medication is too expensive, please contact our office at [phone] option 4 or send [place] a message through [place].   We are unable to tell what your co-pay for medications will be in advance as this is different depending on your insurance coverage. However, we may be able to find a substitute medication at lower cost or fill out paperwork to get insurance to cover a needed medication.   If a prior authorization is required to get  your medication covered by your insurance company, please allow [place] 1-2 business days to complete this process.  Drug prices often vary depending on where the prescription is filled and some pharmacies may offer cheaper prices.  The website www.goodrx.com contains coupons for medications through different pharmacies. The prices here do not account for what the cost may be with help from insurance (it may be cheaper with your insurance),  but the website can give you the price if you did not use any insurance.  - You can print the associated coupon and take it with your prescription to the pharmacy.  - You may also stop by our office during regular business hours and pick up a GoodRx coupon card.  - If you need your prescription sent electronically to a different pharmacy, notify our office through [facility] or by phone at [phone] option 4.

## 2021-06-03 NOTE — Progress Notes (Signed)
Follow-Up Visit   Subjective  Toni Gardner is a 70 y.o. female who presents for the following: Eczema (Patient here today for eczema behind ears, hands, feet. Last year we prescribed TMC 0.1% ointment for her arms and she was not sure if she could use behind ears and at feet. She did start using it a few days ago to all the areas and it has helped. Patient wants to make sure if it's ok for her to use it at those areas.).  She is having peeling with the eczema.  Patient also would like sunscreen recommendations.   The following portions of the chart were reviewed this encounter and updated as appropriate:   Tobacco  Allergies  Meds  Problems  Med Hx  Surg Hx  Fam Hx       Review of Systems:  No other skin or systemic complaints except as noted in HPI or Assessment and Plan.  Objective  Well appearing patient in no apparent distress; mood and affect are within normal limits.  A focused examination was performed including ears, hands, feet. Relevant physical exam findings are noted in the Assessment and Plan.  Right Ear Scaly pink plaques conchal bowls and postauricular skin  Bilateral feet Scaling and maceration web spaces and over distal and lateral soles.   bilateral hands Scaly pink plaques bilateral palms   Assessment & Plan  Seborrheic dermatitis Right Ear  Vs psoriasis  No joint pain  Psoriasis is a chronic non-curable, but treatable genetic/hereditary disease that may have other systemic features affecting other organ systems such as joints (Psoriatic Arthritis). It is associated with an increased risk of inflammatory bowel disease, heart disease, non-alcoholic fatty liver disease, and depression.    Seborrheic dermatitis is a chronic condition with expected duration over 1 year. Reviewed no cure, only control.  Chronic condition with duration or expected duration over one year. Condition is bothersome to patient. Currently flared.    Tinea pedis of both  feet Bilateral feet  Start ciclopirox cream to feet and in between toes twice daily until clear, then one more week, then once weekly for maintenance.   ciclopirox (LOPROX) 0.77 % cream - Bilateral feet Apply topically 2 (two) times daily. Apply twice daily to feet and in between toes until clear then one more week and then once weekly for maintenance.  Rash bilateral hands  Ddx hand dermatitis vs psoriasis over tinea manuum No joint pain  Start ciclopirox cream twice daily. Continue ciclopirox for 4 days before adding Winzora once daily until run out then switch to [facility] 0.1% cream twice daily for up to 2 weeks. Avoid applying to face, groin, and axilla. Use as directed. Risk of skin atrophy with long-term use reviewed.   Topical steroids (such as triamcinolone, fluocinolone, fluocinonide, mometasone, clobetasol, halobetasol, betamethasone, hydrocortisone) can cause thinning and lightening of the skin if they are used for too long in the same area. Your physician has selected the right strength medicine for your problem and area affected on the body. Please use your medication only as directed by your physician to prevent side effects.   Samples of Wynzora x 3  Lot #[id number]   Exp: 10/2022   Patient with history of rash after sunscreen use - Recommend mineral sunscreen - If still having reaction, recommend Vanicream sunscreen specifically  Return in about 3 months (around 09/03/2021) for Eczema.  Graciella Belton, RMA, am acting as scribe for Forest Gleason, MD .  Documentation: I have reviewed the above  documentation for accuracy and completeness, and I agree with the above.  Forest Gleason, MD

## 2021-06-30 ENCOUNTER — Other Ambulatory Visit: Payer: Self-pay

## 2021-06-30 ENCOUNTER — Ambulatory Visit
Admission: RE | Admit: 2021-06-30 | Discharge: 2021-06-30 | Disposition: A | Discharge status: Home / Self Care | Payer: Medicare PPO | Source: Ambulatory Visit | Attending: Internal Medicine | Admitting: Internal Medicine

## 2021-06-30 DIAGNOSIS — Z1231 Encounter for screening mammogram for malignant neoplasm of breast: Secondary | ICD-10-CM | POA: Insufficient documentation

## 2021-09-03 ENCOUNTER — Ambulatory Visit: Payer: Medicare PPO | Admitting: Dermatology

## 2021-09-03 ENCOUNTER — Other Ambulatory Visit: Payer: Self-pay

## 2021-09-03 DIAGNOSIS — M67441 Ganglion, right hand: Secondary | ICD-10-CM

## 2021-09-03 DIAGNOSIS — L219 Seborrheic dermatitis, unspecified: Secondary | ICD-10-CM | POA: Diagnosis not present

## 2021-09-03 DIAGNOSIS — B353 Tinea pedis: Secondary | ICD-10-CM

## 2021-09-03 DIAGNOSIS — M67449 Ganglion, unspecified hand: Secondary | ICD-10-CM

## 2021-09-03 DIAGNOSIS — L309 Dermatitis, unspecified: Secondary | ICD-10-CM | POA: Diagnosis not present

## 2021-09-03 MED ORDER — PIMECROLIMUS 1 % EX CREA: TOPICAL_CREAM | Freq: Two times a day (BID) | CUTANEOUS | 2 refills | Status: DC

## 2021-09-03 MED ORDER — MOMETASONE FUROATE 0.1 % EX SOLN: CUTANEOUS | 1 refills | Status: DC

## 2021-09-03 MED ORDER — KETOCONAZOLE 2 % EX SHAM: MEDICATED_SHAMPOO | CUTANEOUS | 2 refills | Status: DC

## 2021-09-03 NOTE — Patient Instructions (Addendum)
For feet - Continue ciclopirox decreasing to once weekly to prevent recurrence.   For hands, right lower leg - Start pimecrolimus twice daily as needed.  Continue gentle skin care.   For scalp -  Start ketoconazole 2% shampoo apply three times per week, massage into scalp and leave in for 10 minutes before rinsing out. May follow with regular shampoo and conditioner.  Start mometasone lotion once daily to affected areas at scalp as needed for itch. Avoid applying to face, groin, and axilla. Use as directed. Risk of skin atrophy with long-term use reviewed.   Topical steroids (such as triamcinolone, fluocinolone, fluocinonide, mometasone, clobetasol, halobetasol, betamethasone, hydrocortisone) can cause thinning and lightening of the skin if they are used for too long in the same area. Your physician has selected the right strength medicine for your problem and area affected on the body. Please use your medication only as directed by your physician to prevent side effects.   If you have any questions or concerns for your doctor, please call our main line at [phone] and press option 4 to reach your doctor's medical assistant. If no one answers, please leave a voicemail as directed and we will return your call as soon as possible. Messages left after 4 pm will be answered the following business day.   You may also send [place] a message via [place]. We typically respond to MyChart messages within 1-2 business days.  For prescription refills, please ask your pharmacy to contact our office. Our fax number is [phone].  If you have an urgent issue when the clinic is closed that cannot wait until the next business day, you can page your doctor at the number below.    Please note that while we do our best to be available for urgent issues outside of office hours, we are not available 24/7.   If you have an urgent issue and are unable to reach [place], you may choose to seek medical care at your  doctor's office, retail clinic, urgent care center, or emergency room.  If you have a medical emergency, please immediately call 911 or go to the emergency department.  Pager Numbers  - Dr. Nehemiah Massed: [phone]  - Dr. Laurence Ferrari: [phone]  - Dr. Nicole Kindred: [phone]  In the event of inclement weather, please call our main line at [phone] for an update on the status of any delays or closures.  Dermatology Medication Tips: Please keep the boxes that topical medications come in in order to help keep track of the instructions about where and how to use these. Pharmacies typically print the medication instructions only on the boxes and not directly on the medication tubes.   If your medication is too expensive, please contact our office at [phone] option 4 or send [place] a message through [place].   We are unable to tell what your co-pay for medications will be in advance as this is different depending on your insurance coverage. However, we may be able to find a substitute medication at lower cost or fill out paperwork to get insurance to cover a needed medication.   If a prior authorization is required to get your medication covered by your insurance company, please allow [place] 1-2 business days to complete this process.  Drug prices often vary depending on where the prescription is filled and some pharmacies may offer cheaper prices.  The website www.goodrx.com contains coupons for medications through different pharmacies. The prices here do not account for what the cost may be with help  from insurance (it may be cheaper with your insurance), but the website can give you the price if you did not use any insurance.  - You can print the associated coupon and take it with your prescription to the pharmacy.  - You may also stop by our office during regular business hours and pick up a GoodRx coupon card.  - If you need your prescription sent electronically to a different pharmacy, notify  our office through [facility] or by phone at [phone] option 4.

## 2021-09-03 NOTE — Progress Notes (Signed)
Follow-Up Visit   Subjective  Toni Gardner is a 70 y.o. female who presents for the following: Follow-up (Patient here today for 3 month eczema follow up at the hands. Patient currently using samples of wynzora, hands have improved. Patient also using ciclopirox at feet for tinea. ).  Patient does have some spots at scalp that are scaly.   The following portions of the chart were reviewed this encounter and updated as appropriate:   Tobacco  Allergies  Meds  Problems  Med Hx  Surg Hx  Fam Hx      Review of Systems:  No other skin or systemic complaints except as noted in HPI or Assessment and Plan.  Objective  Well appearing patient in no apparent distress; mood and affect are within normal limits.  A focused examination was performed including feet, hands, scalp. Relevant physical exam findings are noted in the Assessment and Plan.  bilateral feet Clear   bilateral hands Scaly pink plaque right palm  right great toe overlying IP joint   right medial pretibia Scaly pink nummular plaque  Scalp Scaly pink plaques   Assessment & Plan  Tinea pedis of both feet bilateral feet  Continue ciclopirox decreasing to once weekly to prevent recurrence.   Related Medications ciclopirox (LOPROX) 0.77 % cream Apply topically 2 (two) times daily. Apply twice daily to feet and in between toes until clear then one more week and then once weekly for maintenance.  Hand dermatitis bilateral hands  Chronic condition with duration or expected duration over one year. Condition is bothersome to patient. Not currently at goal.  Requiring topical steroids 3x weekly, d/c due to atrophy risk  Start pimecrolimus twice daily as needed.  Continue gentle skin care.   pimecrolimus (ELIDEL) 1 % cream - bilateral hands Apply topically 2 (two) times daily.  Digital mucous cyst right great toe overlying IP joint  Favor digital mucous cyst Not bothersome for patient.    Benign-appearing.  Observation.  Call clinic for new or changing lesions.   Dermatitis right medial pretibia  Favor nummular dermatitis  Start pimecrolimus twice daily as needed.  Seborrheic dermatitis Scalp  Vs possible psoriasis No joint pain  Chronic condition with duration or expected duration over one year. Condition is bothersome to patient. Currently flared.  Start ketoconazole 2% shampoo apply three times per week, massage into scalp and leave in for 10 minutes before rinsing out. May wash with regular shampoo and conditioner afterwards. Start mometasone lotion once daily to scalp as needed for itch. Avoid applying to face, groin, and axilla. Use as directed. Risk of skin atrophy with long-term use reviewed.   Topical steroids (such as triamcinolone, fluocinolone, fluocinonide, mometasone, clobetasol, halobetasol, betamethasone, hydrocortisone) can cause thinning and lightening of the skin if they are used for too long in the same area. Your physician has selected the right strength medicine for your problem and area affected on the body. Please use your medication only as directed by your physician to prevent side effects.     ketoconazole (NIZORAL) 2 % shampoo - Scalp apply three times per week, massage into scalp and leave in for 10 minutes before rinsing out  mometasone (ELOCON) 0.1 % lotion - Scalp Apply once daily to scalp as needed for itch. Avoid applying to face, groin, and axilla. Use as directed. Risk of skin atrophy with long-term use reviewed.  Return for 3-4 months.  Graciella Belton, RMA, am acting as scribe for Forest Gleason, MD . Documentation: I  have reviewed the above documentation for accuracy and completeness, and I agree with the above.  Forest Gleason, MD

## 2021-09-04 ENCOUNTER — Other Ambulatory Visit: Payer: Self-pay | Admitting: Internal Medicine

## 2021-09-04 ENCOUNTER — Encounter: Payer: Self-pay | Admitting: Dermatology

## 2021-10-19 ENCOUNTER — Other Ambulatory Visit: Payer: Self-pay | Admitting: Internal Medicine

## 2021-12-23 DIAGNOSIS — H26493 Other secondary cataract, bilateral: Secondary | ICD-10-CM | POA: Diagnosis not present

## 2021-12-23 DIAGNOSIS — H04129 Dry eye syndrome of unspecified lacrimal gland: Secondary | ICD-10-CM | POA: Diagnosis not present

## 2021-12-23 DIAGNOSIS — Z961 Presence of intraocular lens: Secondary | ICD-10-CM | POA: Diagnosis not present

## 2021-12-23 DIAGNOSIS — H40013 Open angle with borderline findings, low risk, bilateral: Secondary | ICD-10-CM | POA: Diagnosis not present

## 2022-01-01 ENCOUNTER — Other Ambulatory Visit: Payer: Self-pay

## 2022-01-01 ENCOUNTER — Ambulatory Visit: Payer: Medicare PPO | Admitting: Dermatology

## 2022-01-01 DIAGNOSIS — D692 Other nonthrombocytopenic purpura: Secondary | ICD-10-CM | POA: Diagnosis not present

## 2022-01-01 DIAGNOSIS — B353 Tinea pedis: Secondary | ICD-10-CM

## 2022-01-01 DIAGNOSIS — L219 Seborrheic dermatitis, unspecified: Secondary | ICD-10-CM

## 2022-01-01 DIAGNOSIS — L309 Dermatitis, unspecified: Secondary | ICD-10-CM

## 2022-01-01 NOTE — Progress Notes (Signed)
Follow-Up Visit   Subjective  Toni Gardner is a 71 y.o. female who presents for the following: Follow-up (Patient here to follow up on seb derm, hand dermatitis and tinea pedis. She is currently using ketoconazole 2% shampoo and mometasone prn for scalp, well controlled. Hand dermatitis is improved and she is rarely needing to use pimecrolimus. Patient using ciclopirox once a week for tinea pedis maintenance. ).  Patient does have a spot at left thigh that she would like checked, she noticed it about 2 weeks ago, no symptoms.   The following portions of the chart were reviewed this encounter and updated as appropriate:   Tobacco   Allergies   Meds   Problems   Med Hx   Surg Hx   Fam Hx       Review of Systems:  No other skin or systemic complaints except as noted in HPI or Assessment and Plan.  Objective  Well appearing patient in no apparent distress; mood and affect are within normal limits.  A focused examination was performed including scalp, hands, feet. Relevant physical exam findings are noted in the Assessment and Plan.  Scalp Very mild scale  bilateral hands Clear   toenails Clear     Assessment & Plan  Seborrheic dermatitis Scalp  Chronic condition with duration or expected duration over one year. Currently well-controlled.  Continue ketoconazole shampoo apply three times per week, massage into scalp and leave in for 10 minutes before rinsing out  Continue mometasone daily as needed for itch  Seborrheic Dermatitis  -  is a chronic persistent rash characterized by pinkness and scaling most commonly of the mid face but also can occur on the scalp (dandruff), ears; mid chest, mid back and groin.  It tends to be exacerbated by stress and cooler weather.  People who have neurologic disease may experience new onset or exacerbation of existing seborrheic dermatitis.  The condition is not curable but treatable and can be controlled.    Related  Medications ketoconazole (NIZORAL) 2 % shampoo apply three times per week, massage into scalp and leave in for 10 minutes before rinsing out  mometasone (ELOCON) 0.1 % lotion Apply once daily to scalp as needed for itch. Avoid applying to face, groin, and axilla. Use as directed. Risk of skin atrophy with long-term use reviewed.  Hand dermatitis bilateral hands  Chronic condition with duration or expected duration over one year. Currently well-controlled.  Continue pimecrolimus twice daily as needed.  Hand Dermatitis is a chronic type of eczema that can come and go on the hands and fingers.  While there is no cure, the rash and symptoms can be managed with topical prescription medications, and for more severe cases, with systemic medications.  Recommend mild soap and routine use of moisturizing cream after handwashing.  Minimize soap/water exposure when possible.     Related Medications pimecrolimus (ELIDEL) 1 % cream Apply topically 2 (two) times daily.  Tinea pedis of both feet toenails  Continue ciclopirox once weekly for maintenance to help prevent recurrence given her chronic onychomycosis  Related Medications ciclopirox (LOPROX) 0.77 % cream Apply topically 2 (two) times daily. Apply twice daily to feet and in between toes until clear then one more week and then once weekly for maintenance.  Purpura - Chronic; persistent and recurrent.  Treatable, but not curable. - Violaceous macules and patches - Benign - Related to trauma, age, sun damage and/or use of blood thinners, chronic use of topical and/or oral steroids -  Observe - Can use OTC arnica containing moisturizer such as Dermend Bruise Formula if desired - Call for worsening or other concerns  Return in about 1 year (around 01/01/2023) for TBSE.  Graciella Belton, RMA, am acting as scribe for Forest Gleason, MD .  Documentation: I have reviewed the above documentation for accuracy and completeness, and I agree with  the above.  Forest Gleason, MD

## 2022-01-01 NOTE — Patient Instructions (Addendum)
For scalp - Continue ketoconazole shampoo apply three times per week, massage into scalp and leave in for 10 minutes before rinsing out  Continue mometasone daily as needed for itch  For hands - Continue pimecrolimus twice daily as needed.  For feet - Continue ciclopirox once weekly for maintenance to help prevent chronic onychomycosis  Recommend taking Heliocare sun protection supplement daily in sunny weather for additional sun protection. For maximum protection on the sunniest days, you can take up to 2 capsules of regular Heliocare OR take 1 capsule of Heliocare Ultra. For prolonged exposure (such as a full day in the sun), you can repeat your dose of the supplement 4 hours after your first dose. Heliocare can be purchased at [employer], at some [facility] or at [email or web address].    Melanoma ABCDEs  Melanoma is the most dangerous type of skin cancer, and is the leading cause of death from skin disease.  You are more likely to develop melanoma if you: Have light-colored skin, light-colored eyes, or red or blond hair Spend a lot of time in the sun Tan regularly, either outdoors or in a tanning bed Have had blistering sunburns, especially during childhood Have a close family member who has had a melanoma Have atypical moles or large birthmarks  Early detection of melanoma is key since treatment is typically straightforward and cure rates are extremely high if we catch it early.   The first sign of melanoma is often a change in a mole or a new dark spot.  The ABCDE system is a way of remembering the signs of melanoma.  A for asymmetry:  The two halves do not match. B for border:  The edges of the growth are irregular. C for color:  A mixture of colors are present instead of an even brown color. D for diameter:  Melanomas are usually (but not always) greater than 11mm - the size of a pencil eraser. E for evolution:  The spot keeps changing in size, shape, and color.  Please  check your skin once per month between visits. You can use a small mirror in front and a large mirror behind you to keep an eye on the back side or your body.   If you see any new or changing lesions before your next follow-up, please call to schedule a visit.  Please continue daily skin protection including broad spectrum sunscreen SPF 30+ to sun-exposed areas, reapplying every 2 hours as needed when you're outdoors.    If You Need Anything After Your Visit  If you have any questions or concerns for your doctor, please call our main line at [phone] and press option 4 to reach your doctor's medical assistant. If no one answers, please leave a voicemail as directed and we will return your call as soon as possible. Messages left after 4 pm will be answered the following business day.   You may also send [place] a message via [place]. We typically respond to MyChart messages within 1-2 business days.  For prescription refills, please ask your pharmacy to contact our office. Our fax number is [phone].  If you have an urgent issue when the clinic is closed that cannot wait until the next business day, you can page your doctor at the number below.    Please note that while we do our best to be available for urgent issues outside of office hours, we are not available 24/7.   If you have an urgent issue and are unable to  reach [place], you may choose to seek medical care at your doctor's office, retail clinic, urgent care center, or emergency room.  If you have a medical emergency, please immediately call 911 or go to the emergency department.  Pager Numbers  - Dr. Nehemiah Massed: [phone]  - Dr. Laurence Ferrari: [phone]  - Dr. Nicole Kindred: [phone]  In the event of inclement weather, please call our main line at [phone] for an update on the status of any delays or closures.  Dermatology Medication Tips: Please keep the boxes that topical medications come in in order to help keep track of the  instructions about where and how to use these. Pharmacies typically print the medication instructions only on the boxes and not directly on the medication tubes.   If your medication is too expensive, please contact our office at [phone] option 4 or send [place] a message through [place].   We are unable to tell what your co-pay for medications will be in advance as this is different depending on your insurance coverage. However, we may be able to find a substitute medication at lower cost or fill out paperwork to get insurance to cover a needed medication.   If a prior authorization is required to get your medication covered by your insurance company, please allow [place] 1-2 business days to complete this process.  Drug prices often vary depending on where the prescription is filled and some pharmacies may offer cheaper prices.  The website www.goodrx.com contains coupons for medications through different pharmacies. The prices here do not account for what the cost may be with help from insurance (it may be cheaper with your insurance), but the website can give you the price if you did not use any insurance.  - You can print the associated coupon and take it with your prescription to the pharmacy.  - You may also stop by our office during regular business hours and pick up a GoodRx coupon card.  - If you need your prescription sent electronically to a different pharmacy, notify our office through [facility] or by phone at [phone] option 4.     Si Usted Necesita Algo Despus de Su Visita  Tambin puede enviarnos un mensaje a travs de Pharmacist, community. Por lo general respondemos a los mensajes de MyChart en el transcurso de 1 a 2 das hbiles.  Para renovar recetas, por favor pida a su farmacia que se ponga en contacto con nuestra oficina. Harland Dingwall de fax es [place] [phone].  Si tiene un asunto urgente cuando la clnica est cerrada y que no puede esperar hasta el siguiente da hbil,  puede llamar/localizar a su doctor(a) al nmero que aparece a continuacin.   Por favor, tenga en cuenta que aunque hacemos todo lo posible para estar disponibles para asuntos urgentes fuera del horario de [place], no estamos disponibles las 24 horas del da, los 7 das de la [place].   Si tiene un problema urgente y no puede comunicarse con nosotros, puede optar por buscar atencin mdica  en el consultorio de su doctor(a), en una clnica privada, en un centro de atencin urgente o en una sala de emergencias.  Si tiene Engineering geologist, por favor llame inmediatamente al 911 o vaya a la sala de emergencias.  Nmeros de bper  - Dr. Nehemiah Massed: [phone]  - Dra. Moye: [phone]  - Dra. Nicole Kindred: [phone]  En caso de inclemencias del [place], por favor llame a Johnsie Kindred principal al [phone] para una actualizacin sobre el [place] de cualquier  retraso o cierre.  Consejos para la medicacin en dermatologa: Por favor, guarde las cajas en las que vienen los medicamentos de uso tpico para ayudarle a seguir las instrucciones sobre dnde y cmo usarlos. Las farmacias generalmente imprimen las instrucciones del medicamento slo en las cajas y no directamente en los tubos del [place].   Si su medicamento es muy caro, por favor, pngase en contacto con Zigmund Daniel llamando al [phone] y presione la opcin 4 o envenos un mensaje a travs de Pharmacist, community.   No podemos decirle cul ser su copago por los medicamentos por adelantado ya que esto es diferente dependiendo de la cobertura de su seguro. Sin embargo, es posible que podamos encontrar un medicamento sustituto a Electrical engineer un formulario para que el seguro cubra el medicamento que se considera necesario.   Si se requiere una autorizacin previa para que su compaa de seguros [place] su medicamento, por favor permtanos de 1 a 2 das hbiles para completar este proceso.  Los precios de los medicamentos varan con  frecuencia dependiendo del Environmental consultant de dnde se surte la receta y alguna farmacias pueden ofrecer precios ms baratos.  El sitio web www.goodrx.com tiene cupones para medicamentos de Airline pilot. Los precios aqu no tienen en cuenta lo que podra costar con la ayuda del seguro (puede ser ms barato con su seguro), pero el sitio web puede darle el precio si no utiliz Research scientist (physical sciences).  - Puede imprimir el cupn correspondiente y llevarlo con su receta a la farmacia.  - Tambin puede pasar por nuestra oficina durante el horario de atencin regular y Charity fundraiser una tarjeta de cupones de GoodRx.  - Si necesita que su receta se enve electrnicamente a una farmacia diferente, informe a nuestra oficina a travs de MyChart de [place] o por telfono llamando al [phone] y presione la opcin 4.

## 2022-01-02 ENCOUNTER — Encounter: Payer: Self-pay | Admitting: Dermatology

## 2022-01-08 ENCOUNTER — Telehealth (INDEPENDENT_AMBULATORY_CARE_PROVIDER_SITE_OTHER): Payer: Self-pay | Admitting: Nurse Practitioner

## 2022-01-08 NOTE — Telephone Encounter (Signed)
She can stop the plavix tomorrow and hold the aspirin the day of the procedure.  She can restart both the day after

## 2022-01-08 NOTE — Telephone Encounter (Signed)
Toni Gardner called and stated she was having a tooth extracted on Monday, and that she takes Plavix and an Asprin once a day.  She wanted to know if should she stop taking the medication until after the tooth has been extracted or keep taking them.  Please advise.

## 2022-01-08 NOTE — Telephone Encounter (Signed)
Ms. Toni Gardner called and stated she was having a tooth extracted on Monday, and that she takes Plavix and an Asprin once a day.  She wanted to know if should she stop taking the medication until after the tooth has been extracted or keep taking them.  Please advise.

## 2022-01-19 IMAGING — MG MM DIGITAL SCREENING BILAT W/ TOMO AND CAD
8 series · 9 of 24 positions shown · non-contrast
Comparison: Previous exam(s).

CLINICAL DATA: Screening.

EXAM:
DIGITAL SCREENING BILATERAL MAMMOGRAM WITH TOMOSYNTHESIS AND CAD
TECHNIQUE: Bilateral screening digital craniocaudal and mediolateral oblique
mammograms were obtained. Bilateral screening digital breast
tomosynthesis was performed. The images were evaluated with
computer-aided detection.

[L MLO synth-2D]
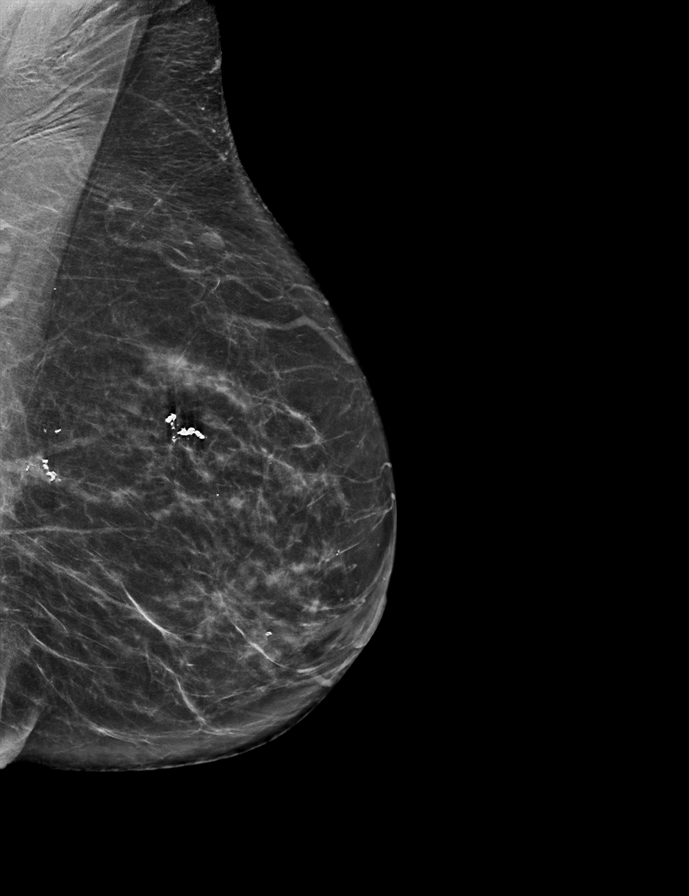

[R MLO synth-2D]
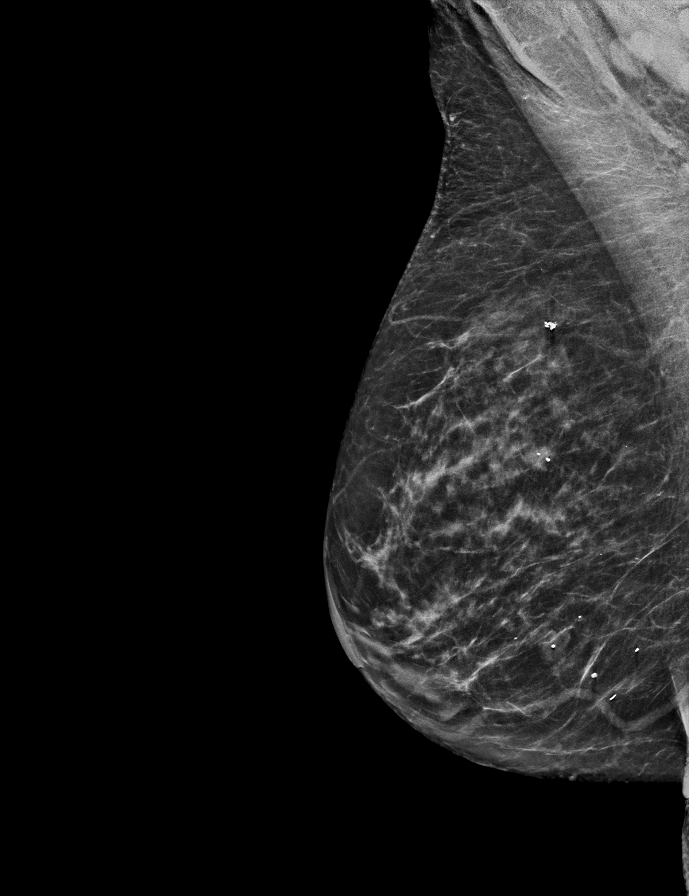

[R CC synth-2D]
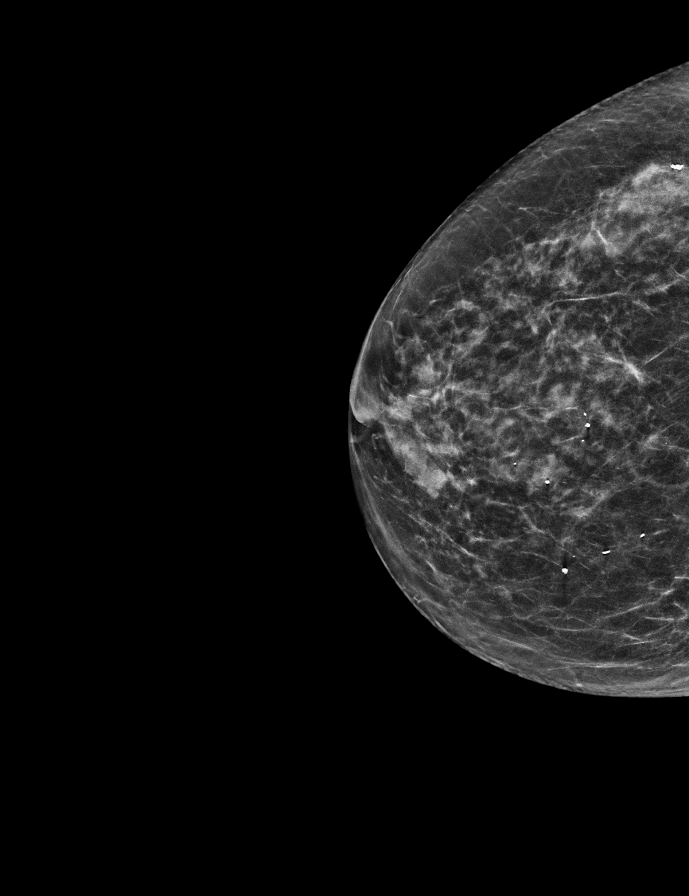

[L CC synth-2D]
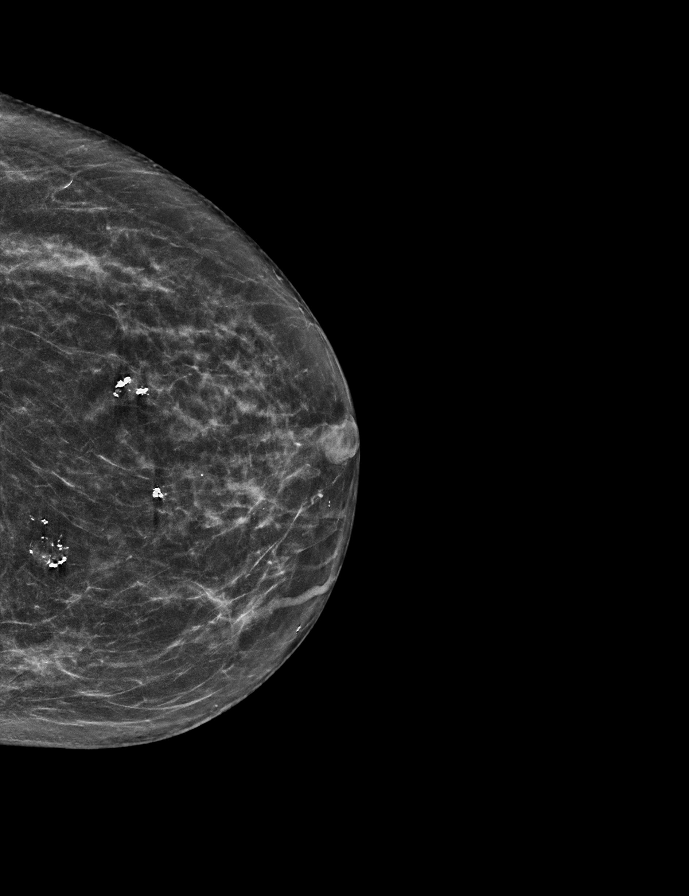

[L MLO tomo · 2 of 67 frames shown]
[frame 22/67]
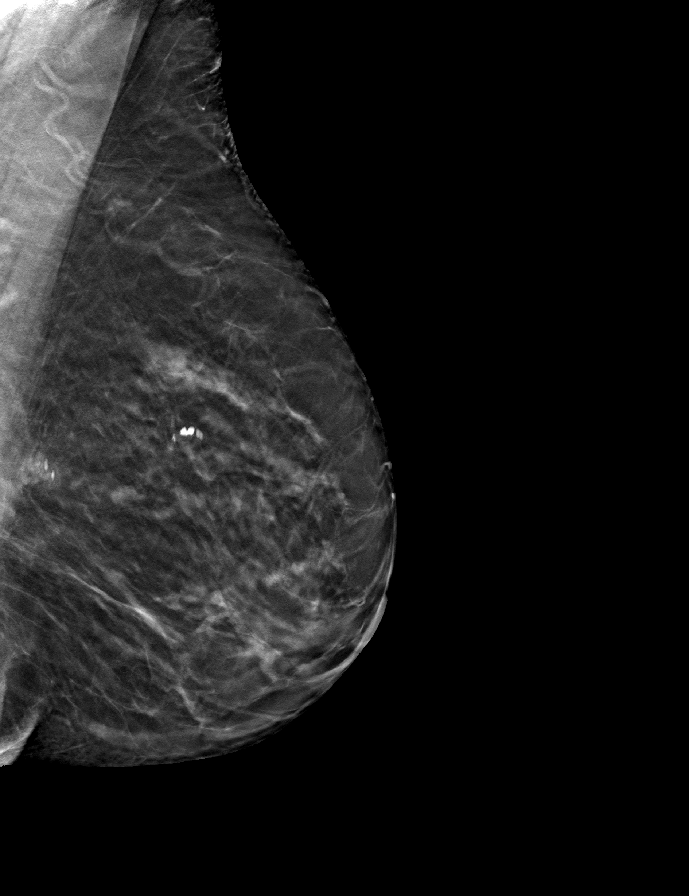
[frame 34/67]
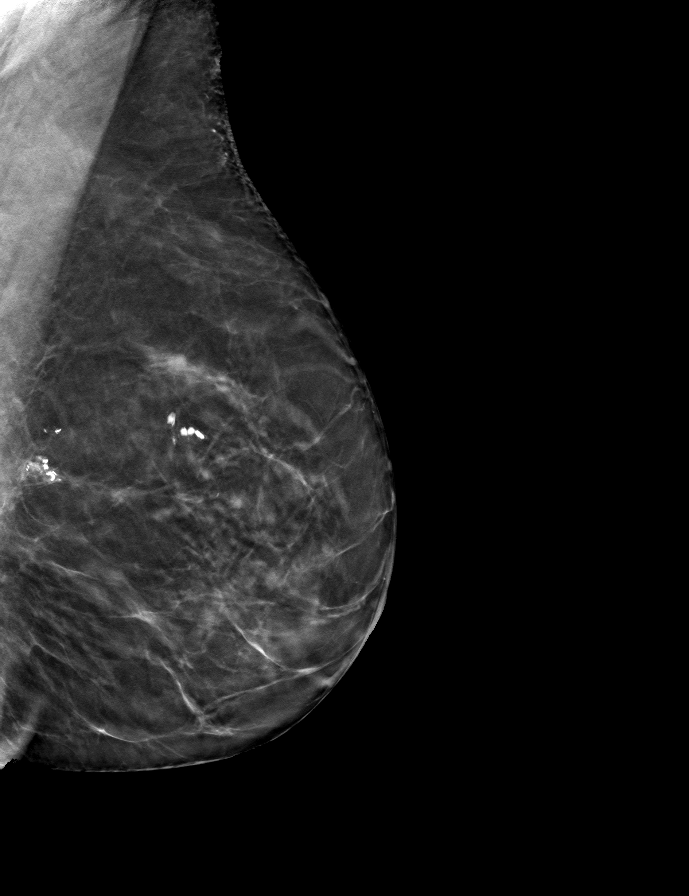

[R MLO tomo · tomo slice 27/54.0]
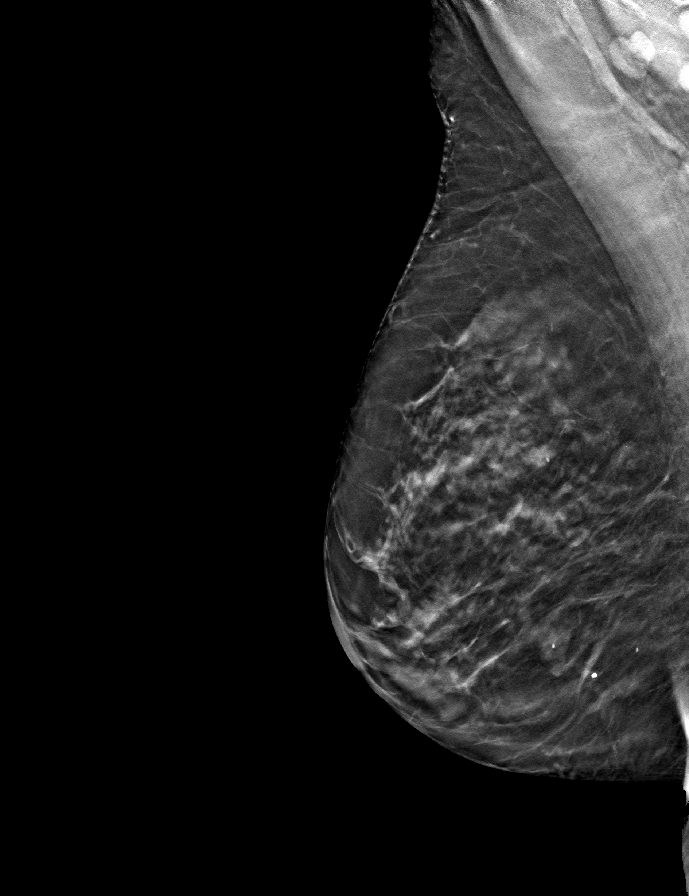

[R CC tomo · tomo slice 25/49.0]
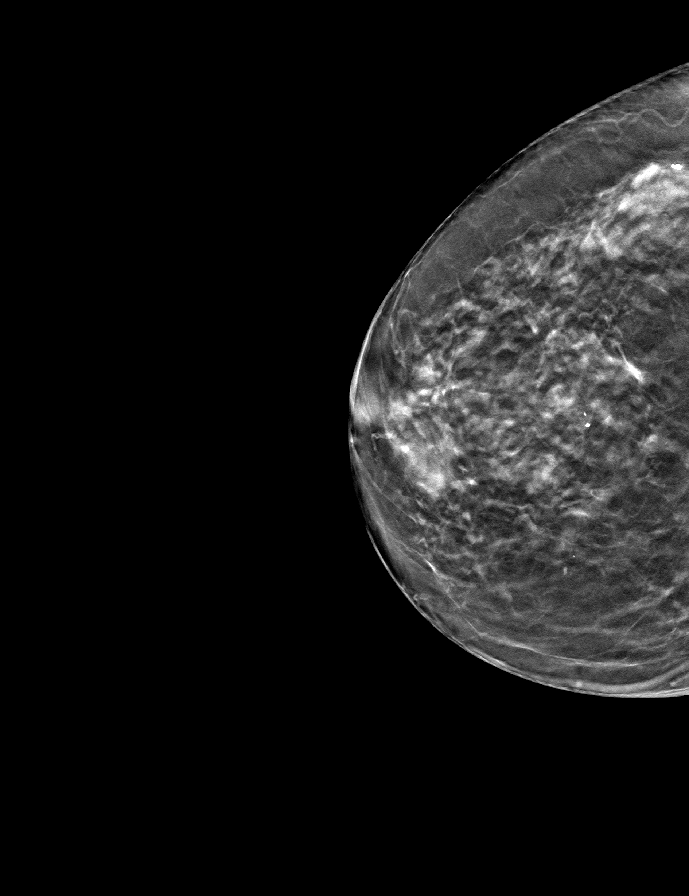

[L CC tomo · tomo slice 29/58.0]
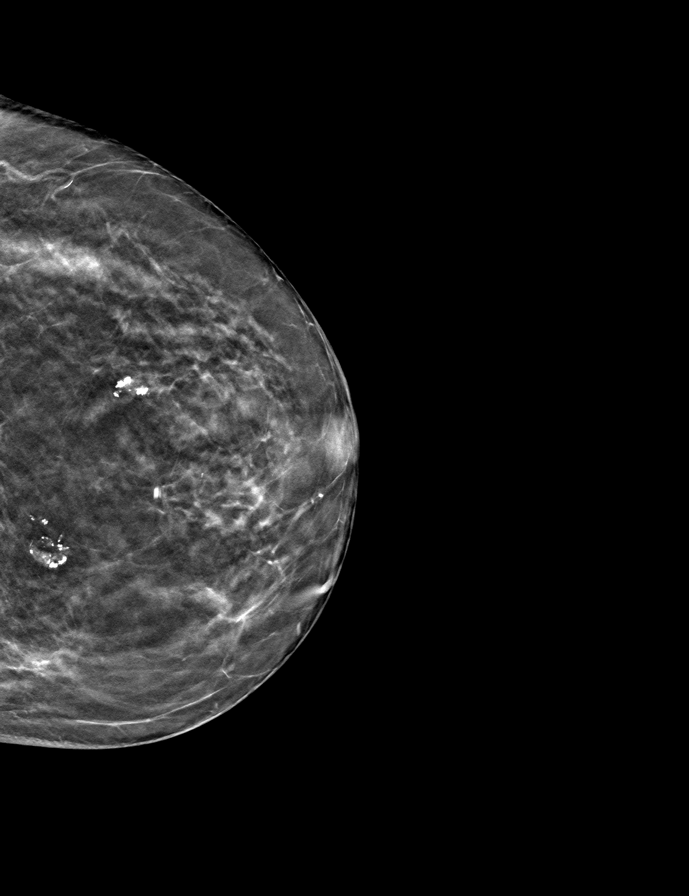

[9 of 24 positions shown; findings below may reference images not displayed]

ACR Breast Density Category c: The breast tissue is heterogeneously
dense, which may obscure small masses.
FINDINGS: There are no findings suspicious for malignancy.
IMPRESSION: No mammographic evidence of malignancy. A result letter of this
screening mammogram will be mailed directly to the patient.

RECOMMENDATION:
Screening mammogram in one year. (Code:Q3-W-BC3)

BI-RADS CATEGORY  1: Negative.

## 2022-01-30 ENCOUNTER — Other Ambulatory Visit: Payer: Self-pay | Admitting: Dermatology

## 2022-01-30 DIAGNOSIS — B353 Tinea pedis: Secondary | ICD-10-CM

## 2022-02-14 DIAGNOSIS — S42292A Other displaced fracture of upper end of left humerus, initial encounter for closed fracture: Secondary | ICD-10-CM | POA: Diagnosis not present

## 2022-02-14 DIAGNOSIS — Z7982 Long term (current) use of aspirin: Secondary | ICD-10-CM | POA: Diagnosis not present

## 2022-02-14 DIAGNOSIS — Z7902 Long term (current) use of antithrombotics/antiplatelets: Secondary | ICD-10-CM | POA: Diagnosis not present

## 2022-02-14 DIAGNOSIS — S42202A Unspecified fracture of upper end of left humerus, initial encounter for closed fracture: Secondary | ICD-10-CM | POA: Diagnosis not present

## 2022-02-14 DIAGNOSIS — M79602 Pain in left arm: Secondary | ICD-10-CM | POA: Diagnosis not present

## 2022-02-17 DIAGNOSIS — S42202A Unspecified fracture of upper end of left humerus, initial encounter for closed fracture: Secondary | ICD-10-CM | POA: Diagnosis not present

## 2022-02-18 ENCOUNTER — Encounter: Payer: Self-pay | Admitting: Internal Medicine

## 2022-02-18 DIAGNOSIS — S42309A Unspecified fracture of shaft of humerus, unspecified arm, initial encounter for closed fracture: Secondary | ICD-10-CM | POA: Insufficient documentation

## 2022-03-03 DIAGNOSIS — S42202A Unspecified fracture of upper end of left humerus, initial encounter for closed fracture: Secondary | ICD-10-CM | POA: Diagnosis not present

## 2022-03-11 ENCOUNTER — Telehealth: Payer: Self-pay | Admitting: Internal Medicine

## 2022-03-11 NOTE — Telephone Encounter (Signed)
Overdue physical.  Please schedule.  Thank you.  ?

## 2022-03-17 DIAGNOSIS — S42202A Unspecified fracture of upper end of left humerus, initial encounter for closed fracture: Secondary | ICD-10-CM | POA: Diagnosis not present

## 2022-03-23 ENCOUNTER — Other Ambulatory Visit (INDEPENDENT_AMBULATORY_CARE_PROVIDER_SITE_OTHER): Payer: Self-pay | Admitting: Vascular Surgery

## 2022-03-23 DIAGNOSIS — I739 Peripheral vascular disease, unspecified: Secondary | ICD-10-CM

## 2022-03-24 ENCOUNTER — Ambulatory Visit (INDEPENDENT_AMBULATORY_CARE_PROVIDER_SITE_OTHER): Payer: Medicare PPO

## 2022-03-24 ENCOUNTER — Ambulatory Visit (INDEPENDENT_AMBULATORY_CARE_PROVIDER_SITE_OTHER): Payer: Medicare PPO | Admitting: Vascular Surgery

## 2022-03-24 ENCOUNTER — Encounter (INDEPENDENT_AMBULATORY_CARE_PROVIDER_SITE_OTHER): Payer: Self-pay | Admitting: Vascular Surgery

## 2022-03-24 VITALS — BP 127/78 | HR 89 | Resp 16 | Wt 112.0 lb

## 2022-03-24 DIAGNOSIS — I70211 Atherosclerosis of native arteries of extremities with intermittent claudication, right leg: Secondary | ICD-10-CM

## 2022-03-24 DIAGNOSIS — E785 Hyperlipidemia, unspecified: Secondary | ICD-10-CM

## 2022-03-24 DIAGNOSIS — Z9889 Other specified postprocedural states: Secondary | ICD-10-CM

## 2022-03-24 DIAGNOSIS — I739 Peripheral vascular disease, unspecified: Secondary | ICD-10-CM

## 2022-03-24 DIAGNOSIS — S42202D Unspecified fracture of upper end of left humerus, subsequent encounter for fracture with routine healing: Secondary | ICD-10-CM | POA: Diagnosis not present

## 2022-03-24 NOTE — Assessment & Plan Note (Signed)
ABIs today are 1.08 on the right and 0.91 on the left with biphasic to triphasic waveforms.  The right digital pressures 112 on the left digital pressure is 86.  Not currently having any worrisome symptoms.  Continue current medical regimen.  Recheck in 1 year. ?

## 2022-03-24 NOTE — Progress Notes (Signed)
? ? ?MRN : [id number] ? ?Toni Gardner is a 71 y.o. (04/02/1951) female who presents with chief complaint of  ?Chief Complaint  ?Patient presents with  ? Follow-up  ?  Ultrasound follow up  ?. ? ?History of Present Illness: Patient returns today in follow up of her PAD.  She has a couple years status post right lower extremity revascularization.  She is doing well.  She currently does not have any significant rest pain, ulceration, or lifestyle limiting claudication. ABIs today are 1.08 on the right and 0.91 on the left with biphasic to triphasic waveforms.  The right digital pressures 112 on the left digital pressure is 86. ? ?Current Outpatient Medications  ?Medication Sig Dispense Refill  ? ALPRAZolam (XANAX) 0.25 MG tablet 1/2 tablet q day prn 30 tablet 1  ? aspirin EC 81 MG tablet Take 1 tablet (81 mg total) by mouth daily. 150 tablet 2  ? ciclopirox (LOPROX) 0.77 % cream APPLY TOPICALLY 2 (TWO) TIMES DAILY. APPLY TWICE DAILY TO FEET AND IN BETWEEN TOES UNTIL CLEAR THEN ONE MORE WEEK AND THEN ONCE WEEKLY FOR MAINTENANCE. 90 g 3  ? clopidogrel (PLAVIX) 75 MG tablet TAKE 1 TABLET BY MOUTH EVERY DAY 90 tablet 3  ? ketoconazole (NIZORAL) 2 % shampoo apply three times per week, massage into scalp and leave in for 10 minutes before rinsing out 120 mL 2  ? mometasone (ELOCON) 0.1 % lotion Apply once daily to scalp as needed for itch. Avoid applying to face, groin, and axilla. Use as directed. Risk of skin atrophy with long-term use reviewed. 60 mL 1  ? pimecrolimus (ELIDEL) 1 % cream Apply topically 2 (two) times daily. 60 g 2  ? rosuvastatin (CRESTOR) 10 MG tablet TAKE 1 TABLET BY MOUTH EVERY DAY 90 tablet 2  ? sertraline (ZOLOFT) 50 MG tablet TAKE 1 TABLET BY MOUTH EVERY DAY 90 tablet 2  ? triamcinolone ointment (KENALOG) 0.1 %     ? alendronate (FOSAMAX) 70 MG tablet Take 70 mg by mouth once a week. (Patient not taking: Reported on 03/24/2022)    ? ?No current facility-administered medications for this visit.   ? ? ?Past Medical History:  ?Diagnosis Date  ? Colon polyp   ? Dermatitis   ? Urine incontinence   ? ? ?Past Surgical History:  ?Procedure Laterality Date  ? ABDOMINAL HYSTERECTOMY    ? ADENOIDECTOMY    ? COLONOSCOPY  06/28/2008  ? Dr Bary Castilla  ? LOWER EXTREMITY ANGIOGRAPHY Right 05/09/2020  ? Procedure: LOWER EXTREMITY ANGIOGRAPHY;  Surgeon: Algernon Huxley, MD;  Location: [facility];  Service: Cardiovascular;  Laterality: Right;  ? ? ? ?Social History  ? ?Tobacco Use  ? Smoking status: Former  ?  Years: 20.00  ?  Types: Cigarettes  ?  Quit date: 04/11/2020  ?  Years since quitting: 1.9  ? Smokeless tobacco: Never  ?Vaping Use  ? Vaping Use: Never used  ?Substance Use Topics  ? Alcohol use: Yes  ? Drug use: No  ? ? ? ? ?Family History  ?Problem Relation Age of Onset  ? Heart disease Father   ? Breast cancer Maternal Aunt   ? Cancer Maternal Uncle   ?     colon  ? Colon polyps Sister   ? ? ? ?Allergies  ?Allergen Reactions  ? Sulfa Antibiotics Swelling  ? ? ?REVIEW OF SYSTEMS (Negative unless checked) ?  ?Constitutional: '[]'$ Weight loss  '[]'$ Fever  '[]'$ Chills ?Cardiac: '[]'$ Chest pain   '[]'$   Chest pressure   '[]'$ Palpitations   '[]'$ Shortness of breath when laying flat   '[]'$ Shortness of breath at rest   '[]'$ Shortness of breath with exertion. ?Vascular:  '[]'$ Pain in legs with walking   '[]'$ Pain in legs at rest   '[]'$ Pain in legs when laying flat   '[]'$ Claudication   '[]'$ Pain in feet when walking  '[]'$ Pain in feet at rest  '[]'$ Pain in feet when laying flat   '[]'$ History of DVT   '[]'$ Phlebitis   '[]'$ Swelling in legs   '[]'$ Varicose veins   '[]'$ Non-healing ulcers ?Pulmonary:   '[]'$ Uses home oxygen   '[]'$ Productive cough   '[]'$ Hemoptysis   '[]'$ Wheeze  '[]'$ COPD   '[]'$ Asthma ?Neurologic:  '[]'$ Dizziness  '[]'$ Blackouts   '[]'$ Seizures   '[]'$ History of stroke   '[]'$ History of TIA  '[]'$ Aphasia   '[]'$ Temporary blindness   '[]'$ Dysphagia   '[]'$ Weakness or numbness in arms   '[]'$ Weakness or numbness in legs ?Musculoskeletal:  '[x]'$ Arthritis   '[]'$ Joint swelling   '[]'$ Joint pain   '[]'$ Low back pain ?Hematologic:   '[]'$ Easy bruising  '[]'$ Easy bleeding   '[]'$ Hypercoagulable state   '[]'$ Anemic   ?Gastrointestinal:  '[]'$ Blood in stool   '[]'$ Vomiting blood  '[]'$ Gastroesophageal reflux/heartburn   '[x]'$ Abdominal pain ?Genitourinary:  '[]'$ Chronic kidney disease   '[x]'$ Difficult urination  '[]'$ Frequent urination  '[]'$ Burning with urination   '[]'$ Hematuria ?Skin:  '[]'$ Rashes   '[]'$ Ulcers   '[]'$ Wounds ?Psychological:  '[]'$ History of anxiety   '[]'$  History of major depression. ? ?Physical Examination ? ?BP 127/78 (BP Location: Right Arm)   Pulse 89   Resp 16   Wt 112 lb (50.8 kg)   BMI 21.87 kg/m?  ?Gen:  WD/WN, NAD ?Head: [place]/AT, No temporalis wasting. ?Ear/Nose/Throat: Hearing grossly intact, nares w/o erythema or drainage ?Eyes: Conjunctiva clear. Sclera non-icteric ?Neck: Supple.  Trachea midline ?Pulmonary:  Good air movement, no use of accessory muscles.  ?Cardiac: RRR, no JVD ?Vascular:  ?Vessel Right Left  ?Radial Palpable Palpable  ?    ?    ?    ?    ?    ?    ?PT Palpable Palpable  ?DP Palpable Palpable  ? ? ?Musculoskeletal: M/S 5/5 throughout.  No deformity or atrophy. No edema. ?Neurologic: Sensation grossly intact in extremities.  Symmetrical.  Speech is fluent.  ?Psychiatric: Judgment intact, Mood & affect appropriate for pt's clinical situation. ?Dermatologic: No rashes or ulcers noted.  No cellulitis or open wounds. ? ? ? ? ? ?Labs ?No results found for this or any previous visit (from the past 2160 hour(s)). ? ?Radiology ?No results found. ? ?Assessment/Plan ?Hyperlipidemia ?lipid control important in reducing the progression of atherosclerotic disease. Continue statin therapy ? ?Atherosclerosis of native arteries of extremity with intermittent claudication ([place]) ?ABIs today are 1.08 on the right and 0.91 on the left with biphasic to triphasic waveforms.  The right digital pressures 112 on the left digital pressure is 86.  Not currently having any worrisome symptoms.  Continue current medical regimen.  Recheck in 1 year. ? ? ? ?Leotis Pain,  MD ? ?03/24/2022 ?3:06 PM ? ? ? ?This note was created with Dragon medical transcription system.  Any errors from dictation are purely unintentional  ?

## 2022-03-27 ENCOUNTER — Other Ambulatory Visit: Payer: Self-pay | Admitting: Internal Medicine

## 2022-03-27 DIAGNOSIS — E785 Hyperlipidemia, unspecified: Secondary | ICD-10-CM

## 2022-03-30 NOTE — Telephone Encounter (Signed)
Patient has not seen PCP since 1/22 labs recent as 01/2022, has appointment scheduled with PCP for 05/20/2022 okay to fill? ?

## 2022-03-31 ENCOUNTER — Other Ambulatory Visit: Payer: Self-pay | Admitting: Internal Medicine

## 2022-03-31 DIAGNOSIS — E785 Hyperlipidemia, unspecified: Secondary | ICD-10-CM

## 2022-03-31 NOTE — Telephone Encounter (Signed)
Left message for patient to return my call. Please advise patient of below schedule lab. Once we receive these labs can refill medication. ?

## 2022-03-31 NOTE — Telephone Encounter (Signed)
Needs labs scheduled prior to refill. Please schedule to be done within the next 1-2 weeks.  Once scheduled ok to refill ?

## 2022-03-31 NOTE — Progress Notes (Signed)
Order placed for labs.

## 2022-04-08 ENCOUNTER — Other Ambulatory Visit (INDEPENDENT_AMBULATORY_CARE_PROVIDER_SITE_OTHER): Payer: Medicare PPO

## 2022-04-08 DIAGNOSIS — E785 Hyperlipidemia, unspecified: Secondary | ICD-10-CM | POA: Diagnosis not present

## 2022-04-08 LAB — HEPATIC FUNCTION PANEL
ALT: 22 U/L (ref 0–35)
AST: 22 U/L (ref 0–37)
Albumin: 4.3 g/dL (ref 3.5–5.2)
Alkaline Phosphatase: 122 U/L — ABNORMAL HIGH (ref 39–117)
Bilirubin, Direct: 0.1 mg/dL (ref 0.0–0.3)
Total Bilirubin: 0.4 mg/dL (ref 0.2–1.2)
Total Protein: 6.7 g/dL (ref 6.0–8.3)

## 2022-04-08 LAB — CBC WITH DIFFERENTIAL/PLATELET
Basophils Absolute: 0 10*3/uL (ref 0.0–0.1)
Basophils Relative: 0.3 % (ref 0.0–3.0)
Eosinophils Absolute: 0.2 10*3/uL (ref 0.0–0.7)
Eosinophils Relative: 2.2 % (ref 0.0–5.0)
HCT: 35.2 % — ABNORMAL LOW (ref 36.0–46.0)
Hemoglobin: 12 g/dL (ref 12.0–15.0)
Lymphocytes Relative: 15.4 % (ref 12.0–46.0)
Lymphs Abs: 1.2 10*3/uL (ref 0.7–4.0)
MCHC: 33.9 g/dL (ref 30.0–36.0)
MCV: 91.1 fl (ref 78.0–100.0)
Monocytes Absolute: 0.8 10*3/uL (ref 0.1–1.0)
Monocytes Relative: 9.6 % (ref 3.0–12.0)
Neutro Abs: 5.8 10*3/uL (ref 1.4–7.7)
Neutrophils Relative %: 72.5 % (ref 43.0–77.0)
Platelets: 216 10*3/uL (ref 150.0–400.0)
RBC: 3.87 Mil/uL (ref 3.87–5.11)
RDW: 14.2 % (ref 11.5–15.5)
WBC: 8 10*3/uL (ref 4.0–10.5)

## 2022-04-08 LAB — LIPID PANEL
Cholesterol: 174 mg/dL (ref 0–200)
HDL: 106.1 mg/dL (ref >39.00)
LDL Cholesterol: 53 mg/dL (ref 0–99)
NonHDL: 67.62
Total CHOL/HDL Ratio: 2
Triglycerides: 74 mg/dL (ref 0.0–149.0)
VLDL: 14.8 mg/dL (ref 0.0–40.0)

## 2022-04-08 LAB — BASIC METABOLIC PANEL
BUN: 15 mg/dL (ref 6–23)
CO2: 24 mEq/L (ref 19–32)
Calcium: 9.1 mg/dL (ref 8.4–10.5)
Chloride: 105 mEq/L (ref 96–112)
Creatinine, Ser: 0.85 mg/dL (ref 0.40–1.20)
GFR: 68.35 mL/min (ref >60.00)
Glucose, Bld: 95 mg/dL (ref 70–99)
Potassium: 4.3 mEq/L (ref 3.5–5.1)
Sodium: 139 mEq/L (ref 135–145)

## 2022-04-08 LAB — TSH: TSH: 2.23 u[IU]/mL (ref 0.35–5.50)

## 2022-04-10 ENCOUNTER — Other Ambulatory Visit: Payer: Self-pay

## 2022-04-10 DIAGNOSIS — R748 Abnormal levels of other serum enzymes: Secondary | ICD-10-CM

## 2022-04-14 DIAGNOSIS — S42202A Unspecified fracture of upper end of left humerus, initial encounter for closed fracture: Secondary | ICD-10-CM | POA: Diagnosis not present

## 2022-04-15 ENCOUNTER — Other Ambulatory Visit (INDEPENDENT_AMBULATORY_CARE_PROVIDER_SITE_OTHER): Payer: Self-pay | Admitting: Vascular Surgery

## 2022-04-21 DIAGNOSIS — S42202D Unspecified fracture of upper end of left humerus, subsequent encounter for fracture with routine healing: Secondary | ICD-10-CM | POA: Diagnosis not present

## 2022-04-28 DIAGNOSIS — S42202D Unspecified fracture of upper end of left humerus, subsequent encounter for fracture with routine healing: Secondary | ICD-10-CM | POA: Diagnosis not present

## 2022-05-05 DIAGNOSIS — S42202D Unspecified fracture of upper end of left humerus, subsequent encounter for fracture with routine healing: Secondary | ICD-10-CM | POA: Diagnosis not present

## 2022-05-20 ENCOUNTER — Encounter: Payer: Self-pay | Admitting: Internal Medicine

## 2022-05-20 ENCOUNTER — Ambulatory Visit (INDEPENDENT_AMBULATORY_CARE_PROVIDER_SITE_OTHER): Payer: Medicare PPO | Admitting: Internal Medicine

## 2022-05-20 VITALS — BP 120/70 | HR 81 | Temp 98.9°F | Resp 15 | Ht 60.0 in | Wt 112.8 lb

## 2022-05-20 DIAGNOSIS — E2839 Other primary ovarian failure: Secondary | ICD-10-CM | POA: Diagnosis not present

## 2022-05-20 DIAGNOSIS — I70211 Atherosclerosis of native arteries of extremities with intermittent claudication, right leg: Secondary | ICD-10-CM | POA: Diagnosis not present

## 2022-05-20 DIAGNOSIS — I700 Atherosclerosis of aorta: Secondary | ICD-10-CM

## 2022-05-20 DIAGNOSIS — S42202D Unspecified fracture of upper end of left humerus, subsequent encounter for fracture with routine healing: Secondary | ICD-10-CM

## 2022-05-20 DIAGNOSIS — Z0000 Encounter for general adult medical examination without abnormal findings: Secondary | ICD-10-CM | POA: Diagnosis not present

## 2022-05-20 DIAGNOSIS — R748 Abnormal levels of other serum enzymes: Secondary | ICD-10-CM | POA: Diagnosis not present

## 2022-05-20 DIAGNOSIS — F439 Reaction to severe stress, unspecified: Secondary | ICD-10-CM

## 2022-05-20 DIAGNOSIS — Z1211 Encounter for screening for malignant neoplasm of colon: Secondary | ICD-10-CM | POA: Diagnosis not present

## 2022-05-20 DIAGNOSIS — E785 Hyperlipidemia, unspecified: Secondary | ICD-10-CM

## 2022-05-20 DIAGNOSIS — M810 Age-related osteoporosis without current pathological fracture: Secondary | ICD-10-CM | POA: Diagnosis not present

## 2022-05-20 DIAGNOSIS — Z1231 Encounter for screening mammogram for malignant neoplasm of breast: Secondary | ICD-10-CM | POA: Diagnosis not present

## 2022-05-20 DIAGNOSIS — E278 Other specified disorders of adrenal gland: Secondary | ICD-10-CM

## 2022-05-20 LAB — HEPATIC FUNCTION PANEL
ALT: 22 U/L (ref 0–35)
AST: 22 U/L (ref 0–37)
Albumin: 4.2 g/dL (ref 3.5–5.2)
Alkaline Phosphatase: 92 U/L (ref 39–117)
Bilirubin, Direct: 0.1 mg/dL (ref 0.0–0.3)
Total Bilirubin: 0.4 mg/dL (ref 0.2–1.2)
Total Protein: 6.9 g/dL (ref 6.0–8.3)

## 2022-05-20 NOTE — Progress Notes (Signed)
Patient ID: Toni Gardner, female   DOB: 03/05/1951, 71 y.o.   MRN: [id number]   Subjective:    Patient ID: Toni Gardner, female    DOB: 06-16-1951, 71 y.o.   MRN: [id number]   Patient here for her physical exam.   Chief Complaint  Patient presents with   Follow-up    Yearly CPE    .   HPI She reports she is doing relatively well.  Did fracture her left arm - 02/14/22.  Has been seeing [facility].  Still with some pain. Doing exercises.  No chest pain or sob reported.  No abdominal pain or bowel change reported.  Saw Dr Lucky Cowboy 4/18 - stable.  Recommended f/u in one year.  Discussed colonoscopy.  Agreeable.  Wants Dr Bary Castilla. Overall she feels things are stable.    Past Medical History:  Diagnosis Date   Colon polyp    Dermatitis    Urine incontinence    Past Surgical History:  Procedure Laterality Date   ABDOMINAL HYSTERECTOMY     ADENOIDECTOMY     COLONOSCOPY  06/28/2008   Dr Bary Castilla   LOWER EXTREMITY ANGIOGRAPHY Right 05/09/2020   Procedure: LOWER EXTREMITY ANGIOGRAPHY;  Surgeon: Algernon Huxley, MD;  Location: [facility];  Service: Cardiovascular;  Laterality: Right;   Family History  Problem Relation Age of Onset   Heart disease Father    Breast cancer Maternal Aunt    Cancer Maternal Uncle        colon   Colon polyps Sister    Social History   Socioeconomic History   Marital status: Married    Spouse name: Not on file   Number of children: Not on file   Years of education: Not on file   Highest education level: Not on file  Occupational History   Not on file  Tobacco Use   Smoking status: Former    Years: 20.00    Types: Cigarettes    Quit date: 04/11/2020    Years since quitting: 2.1   Smokeless tobacco: Never  Vaping Use   Vaping Use: Never used  Substance and Sexual Activity   Alcohol use: Yes   Drug use: No   Sexual activity: Not on file  Other Topics Concern   Not on file  Social History Narrative   Not on file   Social Determinants of Health    Financial Resource Strain: Not on file  Food Insecurity: No Food Insecurity (05/29/2021)   Hunger Vital Sign    Worried About Running Out of Food in the Last Year: Never true    [place] in the Last Year: Never true  Transportation Needs: No Transportation Needs (05/29/2021)   PRAPARE - Hydrologist (Medical): No    Lack of Transportation (Non-Medical): No  Physical Activity: Not on file  Stress: No Stress Concern Present (05/29/2021)   [place]    Feeling of Stress : Not at all  Social Connections: Not on file     Review of Systems  Constitutional:  Negative for appetite change and unexpected weight change.  HENT:  Negative for congestion, sinus pressure and sore throat.   Eyes:  Negative for pain and visual disturbance.  Respiratory:  Negative for cough, chest tightness and shortness of breath.   Cardiovascular:  Negative for chest pain, palpitations and leg swelling.  Gastrointestinal:  Negative for abdominal pain, diarrhea, nausea and vomiting.  Genitourinary:  Negative for difficulty urinating and dysuria.  Musculoskeletal:  Negative for joint swelling and myalgias.       Arm pain.   Skin:  Negative for color change and rash.  Neurological:  Negative for dizziness, light-headedness and headaches.  Hematological:  Negative for adenopathy. Does not bruise/bleed easily.  Psychiatric/Behavioral:  Negative for decreased concentration and dysphoric mood.        Objective:     BP 120/70 (BP Location: Left Arm, Patient Position: Sitting, Cuff Size: Small)   Pulse 81   Temp 98.9 F (37.2 C) (Temporal)   Resp 15   Ht 5' (1.524 m)   Wt 112 lb 12.8 oz (51.2 kg)   SpO2 97%   BMI 22.03 kg/m  Wt Readings from Last 3 Encounters:  05/20/22 112 lb 12.8 oz (51.2 kg)  03/24/22 112 lb (50.8 kg)  05/29/21 104 lb (47.2 kg)    Physical Exam Vitals reviewed.  Constitutional:       General: She is not in acute distress.    Appearance: Normal appearance. She is well-developed.  HENT:     Head: Normocephalic and atraumatic.     Right Ear: External ear normal.     Left Ear: External ear normal.  Eyes:     General: No scleral icterus.       Right eye: No discharge.        Left eye: No discharge.     Conjunctiva/sclera: Conjunctivae normal.  Neck:     Thyroid: No thyromegaly.  Cardiovascular:     Rate and Rhythm: Normal rate and regular rhythm.  Pulmonary:     Effort: No tachypnea, accessory muscle usage or respiratory distress.     Breath sounds: Normal breath sounds. No decreased breath sounds or wheezing.  Chest:  Breasts:    Right: No inverted nipple, mass, nipple discharge or tenderness (no axillary adenopathy).     Left: No inverted nipple, mass, nipple discharge or tenderness (no axilarry adenopathy).  Abdominal:     General: Bowel sounds are normal.     Palpations: Abdomen is soft.     Tenderness: There is no abdominal tenderness.  Musculoskeletal:        General: No swelling or tenderness.     Cervical back: Neck supple.     Comments: Limited rom - left arm.  S/p fracture.   Lymphadenopathy:     Cervical: No cervical adenopathy.  Skin:    Findings: No erythema or rash.  Neurological:     Mental Status: She is alert and oriented to person, place, and time.  Psychiatric:        Mood and Affect: Mood normal.        Behavior: Behavior normal.      Outpatient Encounter Medications as of 05/20/2022  Medication Sig   aspirin EC 81 MG tablet Take 1 tablet (81 mg total) by mouth daily.   ciclopirox (LOPROX) 0.77 % cream APPLY TOPICALLY 2 (TWO) TIMES DAILY. APPLY TWICE DAILY TO FEET AND IN BETWEEN TOES UNTIL CLEAR THEN ONE MORE WEEK AND THEN ONCE WEEKLY FOR MAINTENANCE.   clopidogrel (PLAVIX) 75 MG tablet TAKE 1 TABLET BY MOUTH EVERY DAY   ketoconazole (NIZORAL) 2 % shampoo apply three times per week, massage into scalp and leave in for 10 minutes  before rinsing out   mometasone (ELOCON) 0.1 % lotion Apply once daily to scalp as needed for itch. Avoid applying to face, groin, and axilla. Use as directed. Risk of skin atrophy  with long-term use reviewed.   rosuvastatin (CRESTOR) 10 MG tablet TAKE 1 TABLET BY MOUTH EVERY DAY   sertraline (ZOLOFT) 50 MG tablet TAKE 1 TABLET BY MOUTH EVERY DAY   [DISCONTINUED] alendronate (FOSAMAX) 70 MG tablet Take 70 mg by mouth once a week. (Patient not taking: Reported on 03/24/2022)   [DISCONTINUED] ALPRAZolam (XANAX) 0.25 MG tablet 1/2 tablet q day prn (Patient not taking: Reported on 05/19/2022)   [DISCONTINUED] pimecrolimus (ELIDEL) 1 % cream Apply topically 2 (two) times daily. (Patient not taking: Reported on 05/19/2022)   [DISCONTINUED] triamcinolone ointment (KENALOG) 0.1 %  (Patient not taking: Reported on 05/19/2022)   No facility-administered encounter medications on file as of 05/20/2022.     Lab Results  Component Value Date   WBC 8.0 04/08/2022   HGB 12.0 04/08/2022   HCT 35.2 (L) 04/08/2022   PLT 216.0 04/08/2022   GLUCOSE 95 04/08/2022   CHOL 174 04/08/2022   TRIG 74.0 04/08/2022   HDL 106.10 04/08/2022   LDLCALC 53 04/08/2022   ALT 22 05/20/2022   AST 22 05/20/2022   NA 139 04/08/2022   K 4.3 04/08/2022   CL 105 04/08/2022   CREATININE 0.85 04/08/2022   BUN 15 04/08/2022   CO2 24 04/08/2022   TSH 2.23 04/08/2022    MM 3D SCREEN BREAST BILATERAL  Result Date: 07/01/2021 CLINICAL DATA:  Screening. EXAM: DIGITAL SCREENING BILATERAL MAMMOGRAM WITH TOMOSYNTHESIS AND CAD TECHNIQUE: Bilateral screening digital craniocaudal and mediolateral oblique mammograms were obtained. Bilateral screening digital breast tomosynthesis was performed. The images were evaluated with computer-aided detection. COMPARISON:  Previous exam(s). ACR Breast Density Category c: The breast tissue is heterogeneously dense, which may obscure small masses. FINDINGS: There are no findings suspicious for malignancy.  IMPRESSION: No mammographic evidence of malignancy. A result letter of this screening mammogram will be mailed directly to the patient. RECOMMENDATION: Screening mammogram in one year. (Code:SM-B-01Y) BI-RADS CATEGORY  1: Negative. Electronically Signed   By: Lillia Mountain M.D.   On: 07/01/2021 13:05      Assessment & Plan:   Problem List Items Addressed This Visit     Adrenal nodule ([place])    Found as incidental finding on CT. Being followed by endocrinology.  Last evaluated 04/2021.  Cortisol - suppressed.        Aortic atherosclerosis (HCC)    Continue crestor.       Atherosclerosis of native arteries of extremity with intermittent claudication [facility])    Evaluated Dr Lucky Cowboy 03/24/22. ABIs today are 1.08 on the right and 0.91 on the left with biphasic to triphasic waveforms.  The right digital pressures 112 on the left digital pressure is 86.  Not currently having any worrisome symptoms.  Recheck in 1 year.      Healthcare maintenance    Physical today 05/20/22.  Mammogram 07/01/21 - Birads I.  Schedule f/u mammogram.  Cologuard nefative  10/2018.  Due f/u for colonoscopy.  Request referral to Dr Bary Castilla.        Humerus fracture    Seeing ortho.  Still with some pain.  Exercising.        Hyperlipidemia    On crestor.  Low cholesterol diet and exercise.  Follow lipid panel and liver function tests.        Osteoporosis    Scheduled for bone density 07/23/22.      Stress    On zoloft.  Overall appears to be handling things well.  Follow.  Other Visit Diagnoses     Routine general medical examination at a health care facility    -  Primary   Screening for colon cancer       Relevant Orders   Ambulatory referral to General Surgery   Encounter for screening mammogram for malignant neoplasm of breast       Relevant Orders   MM 3D SCREEN BREAST BILATERAL   Estrogen deficiency       Relevant Orders   DG Bone Density   Abnormal liver enzymes            Einar Pheasant, MD

## 2022-05-20 NOTE — Assessment & Plan Note (Addendum)
Physical today 05/20/22.  Mammogram 07/01/21 - Birads I.  Schedule f/u mammogram.  Cologuard nefative  10/2018.  Due f/u for colonoscopy.  Request referral to Dr Bary Castilla.

## 2022-05-25 ENCOUNTER — Encounter: Payer: Self-pay | Admitting: Internal Medicine

## 2022-05-25 NOTE — Assessment & Plan Note (Signed)
Scheduled for bone density 07/23/22.

## 2022-05-25 NOTE — Assessment & Plan Note (Signed)
Continue crestor 

## 2022-05-25 NOTE — Assessment & Plan Note (Signed)
On zoloft.  Overall appears to be handling things well.  Follow.   

## 2022-05-25 NOTE — Assessment & Plan Note (Signed)
On crestor.  Low cholesterol diet and exercise.  Follow lipid panel and liver function tests.   

## 2022-05-25 NOTE — Assessment & Plan Note (Signed)
Found as incidental finding on CT. Being followed by endocrinology.  Last evaluated 04/2021.  Cortisol - suppressed.

## 2022-05-25 NOTE — Assessment & Plan Note (Signed)
Seeing ortho.  Still with some pain.  Exercising.

## 2022-05-25 NOTE — Assessment & Plan Note (Signed)
Evaluated Dr Dew 03/24/22. ABIs today are 1.08 on the right and 0.91 on the left with biphasic to triphasic waveforms.  The right digital pressures 112 on the left digital pressure is 86.  Not currently having any worrisome symptoms.  Recheck in 1 year. 

## 2022-05-26 DIAGNOSIS — S42202D Unspecified fracture of upper end of left humerus, subsequent encounter for fracture with routine healing: Secondary | ICD-10-CM | POA: Diagnosis not present

## 2022-05-28 DIAGNOSIS — S42202D Unspecified fracture of upper end of left humerus, subsequent encounter for fracture with routine healing: Secondary | ICD-10-CM | POA: Diagnosis not present

## 2022-06-01 ENCOUNTER — Ambulatory Visit (INDEPENDENT_AMBULATORY_CARE_PROVIDER_SITE_OTHER): Payer: Medicare PPO

## 2022-06-01 VITALS — Ht 60.0 in | Wt 112.0 lb

## 2022-06-01 DIAGNOSIS — Z0000 Encounter for general adult medical examination without abnormal findings: Secondary | ICD-10-CM | POA: Diagnosis not present

## 2022-06-01 NOTE — Patient Instructions (Addendum)
  Ms. Wetsel , Thank you for taking time to come for your Medicare Wellness Visit. I appreciate your ongoing commitment to your health goals. Please review the following plan we discussed and let me know if I can assist you in the future.   These are the goals we discussed:  Goals       Patient Stated     I'd like to stay active walking and could increase water intake. (pt-stated)        This is a list of the screening recommended for you and due dates:  Health Maintenance  Topic Date Due   COVID-19 Vaccine (4 - Pfizer series) 06/04/2022*   Zoster (Shingles) Vaccine (1 of 2) 07/03/2022*   Hepatitis C Screening: USPSTF Recommendation to screen - Ages 18-79 yo.  08/07/2022*   Cologuard (Stool DNA test)  08/07/2022*   Tetanus Vaccine  06/02/2023*   Mammogram  07/01/2023   Pneumonia Vaccine  Completed   DEXA scan (bone density measurement)  Completed   HPV Vaccine  Aged Out   Flu Shot  Discontinued  *Topic was postponed. The date shown is not the original due date.

## 2022-06-01 NOTE — Progress Notes (Signed)
Subjective:   Toni Gardner is a 71 y.o. female who presents for Medicare Annual (Subsequent) preventive examination.  Review of Systems    No ROS.  Medicare Wellness Virtual Visit.  Visual/audio telehealth visit, UTA vital signs.   See social history for additional risk factors.   Cardiac Risk Factors include: advanced age (>13men, >4 women)     Objective:    Today's Vitals   06/01/22 1333  Weight: 112 lb (50.8 kg)  Height: 5' (1.524 m)   Body mass index is 21.87 kg/m.     06/01/2022    1:39 PM 05/29/2021    4:13 PM 05/22/2020    1:08 PM 05/09/2020    7:43 AM 10/03/2017    8:26 AM  Advanced Directives  Does Patient Have a Medical Advance Directive? No No No No No  Would patient like information on creating a medical advance directive? No - Patient declined No - Patient declined No - Patient declined Yes (MAU/Ambulatory/Procedural Areas - Information given)     Current Medications (verified) Outpatient Encounter Medications as of 06/01/2022  Medication Sig   aspirin EC 81 MG tablet Take 1 tablet (81 mg total) by mouth daily.   ciclopirox (LOPROX) 0.77 % cream APPLY TOPICALLY 2 (TWO) TIMES DAILY. APPLY TWICE DAILY TO FEET AND IN BETWEEN TOES UNTIL CLEAR THEN ONE MORE WEEK AND THEN ONCE WEEKLY FOR MAINTENANCE.   clopidogrel (PLAVIX) 75 MG tablet TAKE 1 TABLET BY MOUTH EVERY DAY   ketoconazole (NIZORAL) 2 % shampoo apply three times per week, massage into scalp and leave in for 10 minutes before rinsing out   mometasone (ELOCON) 0.1 % lotion Apply once daily to scalp as needed for itch. Avoid applying to face, groin, and axilla. Use as directed. Risk of skin atrophy with long-term use reviewed.   rosuvastatin (CRESTOR) 10 MG tablet TAKE 1 TABLET BY MOUTH EVERY DAY   sertraline (ZOLOFT) 50 MG tablet TAKE 1 TABLET BY MOUTH EVERY DAY   No facility-administered encounter medications on file as of 06/01/2022.    Allergies (verified) Sulfa antibiotics   History: Past Medical  History:  Diagnosis Date   Colon polyp    Dermatitis    Urine incontinence    Past Surgical History:  Procedure Laterality Date   ABDOMINAL HYSTERECTOMY     ADENOIDECTOMY     COLONOSCOPY  06/28/2008   Dr Lemar Livings   LOWER EXTREMITY ANGIOGRAPHY Right 05/09/2020   Procedure: LOWER EXTREMITY ANGIOGRAPHY;  Surgeon: Annice Needy, MD;  Location: [facility];  Service: Cardiovascular;  Laterality: Right;   Family History  Problem Relation Age of Onset   Heart disease Father    Breast cancer Maternal Aunt    Cancer Maternal Uncle        colon   Colon polyps Sister    Social History   Socioeconomic History   Marital status: Married    Spouse name: Not on file   Number of children: Not on file   Years of education: Not on file   Highest education level: Not on file  Occupational History   Not on file  Tobacco Use   Smoking status: Former    Years: 20.00    Types: Cigarettes    Quit date: 04/11/2020    Years since quitting: 2.1   Smokeless tobacco: Never  Vaping Use   Vaping Use: Never used  Substance and Sexual Activity   Alcohol use: Yes   Drug use: No   Sexual activity:  Not on file  Other Topics Concern   Not on file  Social History Narrative   Not on file   Social Determinants of Health   Financial Resource Strain: Low Risk  (06/01/2022)   Overall Financial Resource Strain (CARDIA)    Difficulty of Paying Living Expenses: Not hard at all  Food Insecurity: No Food Insecurity (06/01/2022)   Hunger Vital Sign    Worried About Running Out of Food in the Last Year: Never true    Ran Out of Food in the Last Year: Never true  Transportation Needs: No Transportation Needs (06/01/2022)   PRAPARE - Administrator, Civil Service (Medical): No    Lack of Transportation (Non-Medical): No  Physical Activity: Not on file  Stress: No Stress Concern Present (06/01/2022)   [employer] of Occupational Health - Occupational Stress Questionnaire    Feeling of  Stress : Not at all  Social Connections: Unknown (06/01/2022)   Social Connection and Isolation Panel [NHANES]    Frequency of Communication with Friends and Family: More than three times a week    Frequency of Social Gatherings with Friends and Family: More than three times a week    Attends Religious Services: Not on Marketing executive or Organizations: Not on file    Attends Banker Meetings: Not on file    Marital Status: Not on file    Tobacco Counseling Counseling given: Not Answered   Clinical Intake:  Pre-visit preparation completed: Yes        Diabetes: No  How often do you need to have someone help you when you read instructions, pamphlets, or other written materials from your doctor or pharmacy?: 1 - Never Interpreter Needed?: No      Activities of Daily Living    06/01/2022    1:43 PM  In your present state of health, do you have any difficulty performing the following activities:  Hearing? 0  Vision? 0  Difficulty concentrating or making decisions? 0  Walking or climbing stairs? 0  Dressing or bathing? 0  Doing errands, shopping? 0  Preparing Food and eating ? N  Using the Toilet? N  In the past six months, have you accidently leaked urine? N  Do you have problems with loss of bowel control? N  Managing your Medications? N  Managing your Finances? N  Housekeeping or managing your Housekeeping? N    Patient Care Team: Dale [place], MD as PCP - General (Internal Medicine)  Indicate any recent Medical Services you may have received from other than [facility] providers in the past year (date may be approximate).     Assessment:   This is a routine wellness examination for Toni Gardner.  Virtual Visit via Telephone Note  I connected with  Toni Gardner on 06/01/22 at  1:30 PM EDT by telephone and verified that I am speaking with the correct person using two identifiers.  Persons participating in the virtual visit: patient/Nurse  Health Advisor   I discussed the limitations of performing an evaluation and management service by telehealth. We continued and completed visit with audio only. Some vital signs may be absent or patient reported.   Hearing/Vision screen Hearing Screening - Comments:: Patient is able to hear conversational tones without difficulty. No issues reported. Vision Screening - Comments:: Visual acuity not assessed, virtual visit. They have seen their ophthalmologist in the last 12 months.   Dietary issues and exercise activities discussed: Current Exercise Habits:  Home exercise routine, Type of exercise: walking, Time (Minutes): 30, Frequency (Times/Week): 5, Weekly Exercise (Minutes/Week): 150, Intensity: Mild Healthy diet Good water intake   Goals Addressed               This Visit's Progress     Patient Stated     I'd like to stay active walking and could increase water intake. (pt-stated)   On track      Depression Screen    06/01/2022    1:37 PM 05/19/2022    4:33 PM 05/29/2021    3:42 PM 05/22/2020    1:10 PM 08/17/2019    9:33 AM 10/13/2017   11:04 AM 09/15/2016    1:32 PM  PHQ 2/9 Scores  PHQ - 2 Score 0 0 0 0 0 1 0  PHQ- 9 Score      4     Fall Risk    06/01/2022    1:42 PM 05/19/2022    4:32 PM 05/29/2021    4:14 PM 12/24/2020   11:01 AM 08/22/2020    1:16 PM  Fall Risk   Falls in the past year?  1 0 0 1  Number falls in past yr:  0 0  1  Injury with Fall?  1 0  1  Risk for fall due to :  History of fall(s)  History of fall(s)   Follow up Falls evaluation completed Falls evaluation completed Falls evaluation completed Falls evaluation completed Falls evaluation completed   FALL RISK PREVENTION PERTAINING TO THE HOME: Home free of loose throw rugs in walkways, pet beds, electrical cords, etc? Yes  Adequate lighting in your home to reduce risk of falls? Yes   ASSISTIVE DEVICES UTILIZED TO PREVENT FALLS: Life alert? No  Use of a cane, walker or w/c? No   TIMED UP  AND GO: Was the test performed? No .    Cognitive Function:  Healthy diet Good water intake      05/22/2020    1:11 PM  6CIT Screen  What Year? 0 points  What month? 0 points  Months in reverse 0 points  Repeat phrase 0 points    Immunizations Immunization History  Administered Date(s) Administered   Fluad Quad(high Dose 65+) 09/01/2019, 08/26/2020   Influenza, High Dose Seasonal PF 08/25/2018   PFIZER(Purple Top)SARS-COV-2 Vaccination 01/17/2020, 02/07/2020, 09/23/2020   Pneumococcal Conjugate-13 08/05/2017   Pneumococcal Polysaccharide-23 09/08/2018   TDAP status: Due, Education has been provided regarding the importance of this vaccine. Advised may receive this vaccine at local pharmacy or Health Dept. Aware to provide a copy of the vaccination record if obtained from local pharmacy or Health Dept. Verbalized acceptance and understanding.Deferred.   Screening Tests Health Maintenance  Topic Date Due   COVID-19 Vaccine (4 - Pfizer series) 06/04/2022 (Originally 11/18/2020)   Zoster Vaccines- Shingrix (1 of 2) 07/03/2022 (Originally 08/22/1999)   Hepatitis C Screening  08/07/2022 (Originally 08/22/1967)   Fecal DNA (Cologuard)  08/07/2022 (Originally 10/17/2021)   TETANUS/TDAP  06/02/2023 (Originally 08/21/1968)   MAMMOGRAM  07/01/2023   Pneumonia Vaccine 93+ Years old  Completed   DEXA SCAN  Completed   HPV VACCINES  Aged Out   INFLUENZA VACCINE  Discontinued   Health Maintenance There are no preventive care reminders to display for this patient.  Lung Cancer Screening: (Low Dose CT Chest recommended if Age 31-80 years, 30 pack-year currently smoking OR have quit w/in 15years.) does not qualify.   Vision Screening: Recommended annual ophthalmology exams for early  detection of glaucoma and other disorders of the eye.  Dental Screening: Recommended annual dental exams for proper oral hygiene  Community Resource Referral / Chronic Care Management: CRR required this  visit?  No   CCM required this visit?  No      Plan:   Keep all routine maintenance appointments.   I have personally reviewed and noted the following in the patient's chart:   Medical and social history Use of alcohol, tobacco or illicit drugs  Current medications and supplements including opioid prescriptions.  Functional ability and status Nutritional status Physical activity Advanced directives List of other physicians Hospitalizations, surgeries, and ER visits in previous 12 months Vitals Screenings to include cognitive, depression, and falls Referrals and appointments  In addition, I have reviewed and discussed with patient certain preventive protocols, quality metrics, and best practice recommendations. A written personalized care plan for preventive services as well as general preventive health recommendations were provided to patient.     Ashok Pall, LPN   1/61/0960

## 2022-06-02 DIAGNOSIS — S42202D Unspecified fracture of upper end of left humerus, subsequent encounter for fracture with routine healing: Secondary | ICD-10-CM | POA: Diagnosis not present

## 2022-06-04 DIAGNOSIS — S42202D Unspecified fracture of upper end of left humerus, subsequent encounter for fracture with routine healing: Secondary | ICD-10-CM | POA: Diagnosis not present

## 2022-06-16 DIAGNOSIS — S42202D Unspecified fracture of upper end of left humerus, subsequent encounter for fracture with routine healing: Secondary | ICD-10-CM | POA: Diagnosis not present

## 2022-06-18 DIAGNOSIS — Z1211 Encounter for screening for malignant neoplasm of colon: Secondary | ICD-10-CM | POA: Diagnosis not present

## 2022-06-19 DIAGNOSIS — M25612 Stiffness of left shoulder, not elsewhere classified: Secondary | ICD-10-CM | POA: Diagnosis not present

## 2022-06-19 DIAGNOSIS — M25512 Pain in left shoulder: Secondary | ICD-10-CM | POA: Diagnosis not present

## 2022-06-22 ENCOUNTER — Other Ambulatory Visit: Payer: Self-pay | Admitting: Internal Medicine

## 2022-06-23 DIAGNOSIS — S42202D Unspecified fracture of upper end of left humerus, subsequent encounter for fracture with routine healing: Secondary | ICD-10-CM | POA: Diagnosis not present

## 2022-06-24 DIAGNOSIS — Z1211 Encounter for screening for malignant neoplasm of colon: Secondary | ICD-10-CM | POA: Diagnosis not present

## 2022-06-26 DIAGNOSIS — S42202D Unspecified fracture of upper end of left humerus, subsequent encounter for fracture with routine healing: Secondary | ICD-10-CM | POA: Diagnosis not present

## 2022-06-30 DIAGNOSIS — S42202D Unspecified fracture of upper end of left humerus, subsequent encounter for fracture with routine healing: Secondary | ICD-10-CM | POA: Diagnosis not present

## 2022-07-02 LAB — COLOGUARD: COLOGUARD: POSITIVE — AB

## 2022-07-03 DIAGNOSIS — S42202D Unspecified fracture of upper end of left humerus, subsequent encounter for fracture with routine healing: Secondary | ICD-10-CM | POA: Diagnosis not present

## 2022-07-08 DIAGNOSIS — S42202D Unspecified fracture of upper end of left humerus, subsequent encounter for fracture with routine healing: Secondary | ICD-10-CM | POA: Diagnosis not present

## 2022-07-13 ENCOUNTER — Other Ambulatory Visit: Payer: Self-pay | Admitting: General Surgery

## 2022-07-13 NOTE — Progress Notes (Signed)
Progress Notes - documented in this encounter Larinda Herter, Geronimo Boot, MD - 06/18/2022 10:00 AM EDT Formatting of this note is different from the original. Subjective:   Patient ID: Toni Gardner is a 71 y.o. female.  HPI  The following portions of the patient's history were reviewed and updated as appropriate.  This an established patient is here today for: office visit. The patient has been referred by Dr. Einar Pheasant for evaluation of a colonoscopy. Patient had a colonoscopy done 06-28-08 and then a negative cologuard test on 10-17-18. The patient reports no bleeding or mucus. Patient reports bowel movements one to two times per day. She is interested in repeat cologuard.   Chief Complaint  Patient presents with  Pre-op Exam    BP 95/59  Pulse 98  Temp 36.9 C (98.4 F)  Ht 152.4 cm (5')  Wt 51.3 kg (113 lb)  SpO2 96%  BMI 22.07 kg/m   Past Medical History:  Diagnosis Date  Colon polyp  Dermatitis  Urine incontinence    Past Surgical History:  Procedure Laterality Date  COLONOSCOPY 06/28/2008  ANGIOGRAPHY LOWER EXTREMITY Right 05/09/2020  ABDOMINAL HYSTERECTOMY  ADENOIDECTOMY    OB History   Gravida  2  Para  1  Term   Preterm   AB   Living     SAB   IAB   Ectopic   Molar   Multiple   Live Births     Obstetric Comments  Age at first period 76 Age of first pregnancy 40      Social History   Socioeconomic History  Marital status: Married  Tobacco Use  Smoking status: Former  Types: Cigarettes  Quit date: 04/11/2020  Years since quitting: 2.1  Smokeless tobacco: Never  Substance and Sexual Activity  Alcohol use: Yes  Comment: social  Drug use: Never  Sexual activity: Not Currently    Allergies  Allergen Reactions  Sulfasalazine Swelling  Sulfa (Sulfonamide Antibiotics) Swelling   Current Outpatient Medications  Medication Sig Dispense Refill  ALPRAZolam (XANAX) 0.25 MG tablet 1/2 tablet daily as needed.   aspirin 81 MG EC tablet Take 81 mg by mouth once daily  ciclopirox (LOPROX) 0.77 % cream Apply topically 2 (two) times daily  clopidogreL (PLAVIX) 75 mg tablet  ketoconazole (NIZORAL) 2 % shampoo Apply topically once daily  mometasone (ELOCON) 0.1 % lotion Apply topically once daily  pimecrolimus (ELIDEL) 1 % cream Apply topically 2 (two) times daily  rosuvastatin (CRESTOR) 10 MG tablet Take 10 mg by mouth once daily  sertraline (ZOLOFT) 25 MG tablet Take 25 mg by mouth once daily  dexAMETHasone (DECADRON) 1 MG tablet Take 1 tablet (1 mg total) by mouth as directed for up to 1 dose Take '1mg'$  of dexamethasone at 11pm. Come for cortisol blood test the following morning at 8am (Patient not taking: Reported on 06/18/2022) 1 tablet 0  fluticasone propionate (FLONASE) 50 mcg/actuation nasal spray by Nasal route (Patient not taking: Reported on 04/30/2021)   No current facility-administered medications for this visit.   Family History  Problem Relation Age of Onset  Heart failure Mother  Heart disease Father  Colon polyps Sister  Breast cancer Maternal Aunt  Colon cancer Maternal Uncle   Labs and Radiology:   Laboratory review:  2 Result Notes  Component Ref Range & Units 2 mo ago (04/08/22) 2 yr ago (04/10/20) 3 yr ago (03/03/19) 3 yr ago (02/23/19) 4 yr ago (02/21/18) 5 yr ago  Hemoglobin 12.0 - 15.0 g/dL 12.0  13.8 14.9 14.8 14.5 14.5  HCT 36.0 - 46.0 % 35.2 Low 40.7 44.0 44.0 41.9 42.  MCV 78.0 - 100.0 fl 91.1 96.6 94.2 93.6 92.9 92.5   Slow decline in hemoglobin between March 2019 and May 2023. Minimal change in MCV.  The patient discontinued smoking 2+ years ago after stent placement and her hemoglobin may be now normal without carbon monoxide as a stimulant.  Cologuard October 17, 2018:  Negative.  Colonoscopy June 28, 2008:  Diverticulosis of the sigmoid and lesser extent descending colon.  Review of Systems  Constitutional: Negative for chills and fever.  Respiratory:  Negative for cough.    Objective:  Physical Exam Exam conducted with a chaperone present.  Constitutional:  Appearance: Normal appearance.  Cardiovascular:  Rate and Rhythm: Normal rate and regular rhythm.  Pulses: Normal pulses.  Heart sounds: Normal heart sounds.  Pulmonary:  Effort: Pulmonary effort is normal.  Breath sounds: Normal breath sounds.  Musculoskeletal:  Cervical back: Neck supple.  Skin: General: Skin is warm and dry.  Neurological:  Mental Status: She is alert and oriented to person, place, and time.  Psychiatric:  Mood and Affect: Mood normal.  Behavior: Behavior normal.    Assessment:   Candidate for colon cancer screening.  Plan:   Options for screening reviewed 1) Cologuard versus 2) colonoscopy discussed. Pros and cons, limitations and risk of each reviewed.  At the time of her visit I was concerned with the gradual decline in hemoglobin over the last 3 years, but this is likely related to smoking cessation than occult blood loss.  Request is been placed for Cologuard testing and if negative will repeat in 3 years with her PCP would be appropriate.  This note is partially prepared by Ledell Noss, CMA acting as a scribe in the presence of Dr. Hervey Ard, MD.   The documentation recorded by the scribe accurately reflects the service I personally performed and the decisions made by me.   Robert Bellow, MD FACS   Electronically signed by Mayer Masker, MD at 06/18/2022 10:54 AM EDT  Cologuard  June 24, 2022: Positive.

## 2022-07-16 DIAGNOSIS — S42202D Unspecified fracture of upper end of left humerus, subsequent encounter for fracture with routine healing: Secondary | ICD-10-CM | POA: Diagnosis not present

## 2022-07-22 ENCOUNTER — Other Ambulatory Visit: Payer: Self-pay | Admitting: Internal Medicine

## 2022-07-23 ENCOUNTER — Ambulatory Visit
Admission: RE | Admit: 2022-07-23 | Discharge: 2022-07-23 | Disposition: A | Discharge status: Home / Self Care | Payer: Medicare PPO | Source: Ambulatory Visit | Attending: Internal Medicine | Admitting: Internal Medicine

## 2022-07-23 DIAGNOSIS — E2839 Other primary ovarian failure: Secondary | ICD-10-CM | POA: Insufficient documentation

## 2022-07-23 DIAGNOSIS — Z1231 Encounter for screening mammogram for malignant neoplasm of breast: Secondary | ICD-10-CM | POA: Diagnosis not present

## 2022-07-23 DIAGNOSIS — M8588 Other specified disorders of bone density and structure, other site: Secondary | ICD-10-CM | POA: Diagnosis not present

## 2022-07-23 DIAGNOSIS — S42202D Unspecified fracture of upper end of left humerus, subsequent encounter for fracture with routine healing: Secondary | ICD-10-CM | POA: Diagnosis not present

## 2022-07-23 DIAGNOSIS — M810 Age-related osteoporosis without current pathological fracture: Secondary | ICD-10-CM | POA: Diagnosis not present

## 2022-07-23 DIAGNOSIS — Z780 Asymptomatic menopausal state: Secondary | ICD-10-CM | POA: Diagnosis not present

## 2022-07-29 ENCOUNTER — Ambulatory Visit
Admission: RE | Admit: 2022-07-29 | Discharge: 2022-07-29 | Disposition: A | Discharge status: Home / Self Care | Payer: Medicare PPO | Attending: General Surgery | Admitting: General Surgery

## 2022-07-29 ENCOUNTER — Encounter: Payer: Self-pay | Admitting: General Surgery

## 2022-07-29 ENCOUNTER — Ambulatory Visit: Payer: Medicare PPO | Admitting: Anesthesiology

## 2022-07-29 ENCOUNTER — Encounter
Admission: RE | Disposition: A | Discharge status: Home / Self Care | Payer: Self-pay | Source: Home / Self Care | Attending: General Surgery

## 2022-07-29 DIAGNOSIS — K5730 Diverticulosis of large intestine without perforation or abscess without bleeding: Secondary | ICD-10-CM | POA: Diagnosis not present

## 2022-07-29 DIAGNOSIS — R195 Other fecal abnormalities: Secondary | ICD-10-CM | POA: Diagnosis not present

## 2022-07-29 DIAGNOSIS — D126 Benign neoplasm of colon, unspecified: Secondary | ICD-10-CM | POA: Diagnosis not present

## 2022-07-29 DIAGNOSIS — D120 Benign neoplasm of cecum: Secondary | ICD-10-CM | POA: Diagnosis not present

## 2022-07-29 DIAGNOSIS — Z1211 Encounter for screening for malignant neoplasm of colon: Secondary | ICD-10-CM | POA: Diagnosis not present

## 2022-07-29 DIAGNOSIS — K635 Polyp of colon: Secondary | ICD-10-CM | POA: Diagnosis not present

## 2022-07-29 DIAGNOSIS — D123 Benign neoplasm of transverse colon: Secondary | ICD-10-CM | POA: Diagnosis not present

## 2022-07-29 HISTORY — DX: Other specified postprocedural states: R11.2

## 2022-07-29 HISTORY — DX: Other specified postprocedural states: Z98.890

## 2022-07-29 HISTORY — PX: COLONOSCOPY WITH PROPOFOL: SHX5780

## 2022-07-29 SURGERY — COLONOSCOPY WITH PROPOFOL
Anesthesia: General

## 2022-07-29 MED ORDER — CLOPIDOGREL BISULFATE 75 MG PO TABS: 75.0000 mg | ORAL_TABLET | Freq: Every day | ORAL | 3 refills | Status: DC

## 2022-07-29 MED ORDER — PROPOFOL 10 MG/ML IV BOLUS
INTRAVENOUS | Status: DC | PRN
  Administered 2022-07-29 (×8): 40 mg via INTRAVENOUS
  Administered 2022-07-29: 80 mg via INTRAVENOUS
  Administered 2022-07-29 (×2): 40 mg via INTRAVENOUS

## 2022-07-29 MED ORDER — SODIUM CHLORIDE 0.9 % IV SOLN: INTRAVENOUS | Status: DC

## 2022-07-29 MED ORDER — SODIUM CHLORIDE 0.9 % IV SOLN
INTRAVENOUS | Status: AC | PRN
  Administered 2022-07-29: 4 mL via INTRAMUSCULAR

## 2022-07-29 NOTE — Anesthesia Preprocedure Evaluation (Signed)
Anesthesia Evaluation  Patient identified by MRN, date of birth, ID band Patient awake    Reviewed: Allergy & Precautions, NPO status , Patient's Chart, lab work & pertinent test results  History of Anesthesia Complications (+) PONV and history of anesthetic complications  Airway Mallampati: II  TM Distance: >3 FB Neck ROM: Full    Dental no notable dental hx. (+) Teeth Intact   Pulmonary neg pulmonary ROS, neg sleep apnea, neg COPD, Patient abstained from smoking.Not current smoker, former smoker,    Pulmonary exam normal breath sounds clear to auscultation       Cardiovascular Exercise Tolerance: Good METS(-) hypertension+ Peripheral Vascular Disease  (-) CAD and (-) Past MI (-) dysrhythmias  Rhythm:Regular Rate:Normal - Systolic murmurs    Neuro/Psych negative neurological ROS  negative psych ROS   GI/Hepatic neg GERD  ,(+)     (-) substance abuse  ,   Endo/Other  neg diabetes  Renal/GU negative Renal ROS     Musculoskeletal   Abdominal   Peds  Hematology   Anesthesia Other Findings Past Medical History: No date: Colon polyp No date: Dermatitis No date: PONV (postoperative nausea and vomiting) No date: Urine incontinence  Reproductive/Obstetrics                             Anesthesia Physical Anesthesia Plan  ASA: 2  Anesthesia Plan: General   Post-op Pain Management: Minimal or no pain anticipated   Induction: Intravenous  PONV Risk Score and Plan: 4 or greater and Propofol infusion, TIVA and Ondansetron  Airway Management Planned: Nasal Cannula  Additional Equipment: None  Intra-op Plan:   Post-operative Plan:   Informed Consent: I have reviewed the patients History and Physical, chart, labs and discussed the procedure including the risks, benefits and alternatives for the proposed anesthesia with the patient or authorized representative who has indicated his/her  understanding and acceptance.     Dental advisory given  Plan Discussed with: CRNA and Surgeon  Anesthesia Plan Comments: (Discussed risks of anesthesia with patient, including possibility of difficulty with spontaneous ventilation under anesthesia necessitating airway intervention, PONV, and rare risks such as cardiac or respiratory or neurological events, and allergic reactions. Discussed the role of CRNA in patient's perioperative care. Patient understands.)        Anesthesia Quick Evaluation

## 2022-07-29 NOTE — Anesthesia Postprocedure Evaluation (Signed)
Anesthesia Post Note  Patient: Toni Gardner  Procedure(s) Performed: COLONOSCOPY WITH PROPOFOL  Patient location during evaluation: Endoscopy Anesthesia Type: General Level of consciousness: awake and alert Pain management: pain level controlled Vital Signs Assessment: post-procedure vital signs reviewed and stable Respiratory status: spontaneous breathing, nonlabored ventilation, respiratory function stable and patient connected to nasal cannula oxygen Cardiovascular status: blood pressure returned to baseline and stable Postop Assessment: no apparent nausea or vomiting Anesthetic complications: no   No notable events documented.   Last Vitals:  Vitals:   07/29/22 1100 07/29/22 1110  BP: (!) 93/59 115/61  Pulse: 76 69  Resp: 13 12  Temp:    SpO2: 99% 100%    Last Pain:  Vitals:   07/29/22 1110  TempSrc:   PainSc: 0-No pain                 [name]

## 2022-07-29 NOTE — H&P (Signed)
Toni Gardner [id number] October 07, 1951     HPI: Healthy 71 y/o woman with a recent positive Cologuard test. For colonoscopy. Tolerated the prep well.   Medications Prior to Admission  Medication Sig Dispense Refill Last Dose   aspirin EC 81 MG tablet Take 1 tablet (81 mg total) by mouth daily. 150 tablet 2 07/29/2022 at 0700   rosuvastatin (CRESTOR) 10 MG tablet TAKE 1 TABLET BY MOUTH EVERY DAY 90 tablet 2 07/28/2022   ciclopirox (LOPROX) 0.77 % cream APPLY TOPICALLY 2 (TWO) TIMES DAILY. APPLY TWICE DAILY TO FEET AND IN BETWEEN TOES UNTIL CLEAR THEN ONE MORE WEEK AND THEN ONCE WEEKLY FOR MAINTENANCE. 90 g 3    clopidogrel (PLAVIX) 75 MG tablet TAKE 1 TABLET BY MOUTH EVERY DAY 90 tablet 3 07/22/2022   ketoconazole (NIZORAL) 2 % shampoo apply three times per week, massage into scalp and leave in for 10 minutes before rinsing out 120 mL 2    mometasone (ELOCON) 0.1 % lotion Apply once daily to scalp as needed for itch. Avoid applying to face, groin, and axilla. Use as directed. Risk of skin atrophy with long-term use reviewed. 60 mL 1    sertraline (ZOLOFT) 50 MG tablet TAKE 1 TABLET BY MOUTH EVERY DAY 90 tablet 2    Allergies  Allergen Reactions   Sulfa Antibiotics Swelling   Past Medical History:  Diagnosis Date   Colon polyp    Dermatitis    PONV (postoperative nausea and vomiting)    Urine incontinence    Past Surgical History:  Procedure Laterality Date   ABDOMINAL HYSTERECTOMY     ADENOIDECTOMY     COLONOSCOPY  06/28/2008   Dr Bary Castilla   LOWER EXTREMITY ANGIOGRAPHY Right 05/09/2020   Procedure: LOWER EXTREMITY ANGIOGRAPHY;  Surgeon: Algernon Huxley, MD;  Location: [facility];  Service: Cardiovascular;  Laterality: Right;   Social History   Socioeconomic History   Marital status: Married    Spouse name: Not on file   Number of children: Not on file   Years of education: Not on file   Highest education level: Not on file  Occupational History   Not on file  Tobacco Use    Smoking status: Former    Years: 20.00    Types: Cigarettes    Quit date: 04/11/2020    Years since quitting: 2.2   Smokeless tobacco: Never  Vaping Use   Vaping Use: Never used  Substance and Sexual Activity   Alcohol use: Yes   Drug use: No   Sexual activity: Not on file  Other Topics Concern   Not on file  Social History Narrative   Not on file   Social Determinants of Health   Financial Resource Strain: Low Risk  (06/01/2022)   Overall Financial Resource Strain (CARDIA)    Difficulty of Paying Living Expenses: Not hard at all  Food Insecurity: No Food Insecurity (06/01/2022)   Hunger Vital Sign    Worried About Running Out of Food in the Last Year: Never true    [place] in the Last Year: Never true  Transportation Needs: No Transportation Needs (06/01/2022)   PRAPARE - Hydrologist (Medical): No    Lack of Transportation (Non-Medical): No  Physical Activity: Not on file  Stress: No Stress Concern Present (06/01/2022)   [place]    Feeling of Stress : Not at all  Social Connections: Unknown (06/01/2022)  Social Licensed conveyancer [NHANES]    Frequency of Communication with Friends and Family: More than three times a week    Frequency of Social Gatherings with Friends and Family: More than three times a week    Attends Religious Services: Not on file    Active Member of Clubs or Organizations: Not on file    Attends Archivist Meetings: Not on file    Marital Status: Not on file  Intimate Partner Violence: Not At Risk (06/01/2022)   Humiliation, Afraid, Rape, and Kick questionnaire    Fear of Current or Ex-Partner: No    Emotionally Abused: No    Physically Abused: No    Sexually Abused: No   Social History   Social History Narrative   Not on file     ROS: Negative.     PE: HEENT: Negative. Lungs: Clear. Cardio:  RR.   Assessment/Plan:  Proceed with planned endoscopy.   Forest Gleason [facility] 07/29/2022

## 2022-07-29 NOTE — Transfer of Care (Signed)
Immediate Anesthesia Transfer of Care Note  Patient: Toni Gardner  Procedure(s) Performed: COLONOSCOPY WITH PROPOFOL  Patient Location: Endoscopy Unit  Anesthesia Type:General  Level of Consciousness: drowsy  Airway & Oxygen Therapy: Patient Spontanous Breathing and Patient connected to nasal cannula oxygen  Post-op Assessment: Report given to RN and Post -op Vital signs reviewed and stable  Post vital signs: Reviewed and stable  Last Vitals:  Vitals Value Taken Time  BP 96/53 07/29/22 1050  Temp 36.4 C 07/29/22 1050  Pulse 79 07/29/22 1052  Resp 13 07/29/22 1052  SpO2 96 % 07/29/22 1052  Vitals shown include unvalidated device data.  Last Pain:  Vitals:   07/29/22 1050  TempSrc: Temporal  PainSc: Asleep         Complications: No notable events documented.

## 2022-07-29 NOTE — Op Note (Signed)
[facility]astroenterology Patient Name: Toni Gardner Procedure Date: 07/29/2022 10:01 AM MRN: [id number] Account #: [id number] Date of Birth: 06/21/1951 Admit Type: Outpatient Age: 71 Room: [facility] ENDO ROOM 1 Gender: Female Note Status: Finalized Instrument Name: Peds Colonoscope [id number] Procedure:             Colonoscopy Indications:           Positive Cologuard test Providers:             Robert Bellow, MD Referring MD:          Einar Pheasant, MD (Referring MD) Medicines:             Propofol per Anesthesia Complications:         No immediate complications. Procedure:             Pre-Anesthesia Assessment:                        - Prior to the procedure, a History and Physical was                         performed, and patient medications, allergies and                         sensitivities were reviewed. The patient's tolerance                         of previous anesthesia was reviewed.                        - The risks and benefits of the procedure and the                         sedation options and risks were discussed with the                         patient. All questions were answered and informed                         consent was obtained.                        After obtaining informed consent, the colonoscope was                         passed under direct vision. Throughout the procedure,                         the patient's blood pressure, pulse, and oxygen                         saturations were monitored continuously. The                         Colonoscope was introduced through the anus and                         advanced to the the cecum, identified by appendiceal  orifice and ileocecal valve. The colonoscopy was                         technically difficult and complex due to multiple                         diverticula in the colon. The patient tolerated the                         procedure well. The  quality of the bowel preparation                         was excellent. Findings:      A 15 mm polyp was found in the cecum. The polyp was sessile. Area was       successfully injected with 4 mL saline for a lift polypectomy. The polyp       was removed with a hot snare. The polyp was removed with a piecemeal       technique using a hot snare. Resection and retrieval were complete. To       prevent bleeding after the polypectomy, one hemostatic clip was       successfully placed (MR conditional). There was no bleeding at the end       of the procedure.      Two sessile polyps were found in the transverse colon and proximal       transverse colon. The polyps were 5 to 13 mm in size. These polyps were       removed with a hot snare. Resection and retrieval were complete.      Multiple large-mouthed diverticula were found in the recto-sigmoid colon       and sigmoid colon.      The retroflexed view of the distal rectum and anal verge was normal and       showed no anal or rectal abnormalities. Impression:            - One 15 mm polyp in the cecum, removed with a hot                         snare and removed piecemeal using a hot snare.                         Resected and retrieved. Injected. Clip (MR                         conditional) was placed.                        - Two 5 to 13 mm polyps in the transverse colon and in                         the proximal transverse colon, removed with a hot                         snare. Resected and retrieved.                        - Diverticulosis in the recto-sigmoid colon and in the  sigmoid colon.                        - The distal rectum and anal verge are normal on                         retroflexion view. Recommendation:        - Telephone endoscopist for pathology results in 1                         week. Procedure Code(s):     --- Professional ---                        [phone], Colonoscopy, flexible; with  removal of                         tumor(s), polyp(s), or other lesion(s) by snare                         technique                        [id number], Colonoscopy, flexible; with directed submucosal                         injection(s), any substance Diagnosis Code(s):     --- Professional ---                        K63.5, Polyp of colon                        R19.5, Other fecal abnormalities                        K57.30, Diverticulosis of large intestine without                         perforation or abscess without bleeding CPT copyright 2019 [employer]. All rights reserved. The codes documented in this report are preliminary and upon coder review may  be revised to meet current compliance requirements. Robert Bellow, MD 07/29/2022 10:50:47 AM This report has been signed electronically. Number of Addenda: 0 Note Initiated On: 07/29/2022 10:01 AM Scope Withdrawal Time: 0 hours 20 minutes 48 seconds  Total Procedure Duration: 0 hours 31 minutes 25 seconds  Estimated Blood Loss:  Estimated blood loss was minimal.      [facility]

## 2022-07-30 ENCOUNTER — Encounter: Payer: Self-pay | Admitting: General Surgery

## 2022-07-30 LAB — SURGICAL PATHOLOGY

## 2022-09-07 ENCOUNTER — Encounter: Payer: Self-pay | Admitting: Internal Medicine

## 2022-09-07 ENCOUNTER — Ambulatory Visit: Payer: Medicare PPO | Admitting: Internal Medicine

## 2022-09-07 VITALS — BP 110/60 | HR 81 | Temp 98.0°F | Ht 59.0 in | Wt 114.0 lb

## 2022-09-07 DIAGNOSIS — E278 Other specified disorders of adrenal gland: Secondary | ICD-10-CM

## 2022-09-07 DIAGNOSIS — S42202D Unspecified fracture of upper end of left humerus, subsequent encounter for fracture with routine healing: Secondary | ICD-10-CM

## 2022-09-07 DIAGNOSIS — M810 Age-related osteoporosis without current pathological fracture: Secondary | ICD-10-CM

## 2022-09-07 DIAGNOSIS — Z86010 Personal history of colonic polyps: Secondary | ICD-10-CM | POA: Diagnosis not present

## 2022-09-07 DIAGNOSIS — E785 Hyperlipidemia, unspecified: Secondary | ICD-10-CM | POA: Diagnosis not present

## 2022-09-07 NOTE — Progress Notes (Signed)
Patient ID: Toni Gardner, female   DOB: [birth year], 71 y.o.   MRN: [id number]   Subjective:    Patient ID: Toni Gardner, female    DOB: 1951/06/24, 71 y.o.   MRN: [id number]   Patient here for  Chief Complaint  Patient presents with   Follow-up    Discuss osteoporosis   .   HPI Appt scheduled to discuss treatment for osteoporosis.  Of note, just had colonoscopy (Dr Bary Castilla).  Recommended f/u colonoscopy in 3 years.  Recent fracture - proximal left humerus.  Being followed by ortho.  PT.  Better.  Increased rom.  Bone density 07/23/22 - osteoporosis.  Discussed results and treatment options.  Discussed oral bisphosphonates, reclast and prolia.  Discussed calcium and vitamin D supplementation.    Past Medical History:  Diagnosis Date   Colon polyp    Dermatitis    PONV (postoperative nausea and vomiting)    Urine incontinence    Past Surgical History:  Procedure Laterality Date   ABDOMINAL HYSTERECTOMY     ADENOIDECTOMY     COLONOSCOPY  06/28/2008   Dr Bary Castilla   COLONOSCOPY WITH PROPOFOL N/A 07/29/2022   Procedure: COLONOSCOPY WITH PROPOFOL;  Surgeon: Robert Bellow, MD;  Location: [facility] ENDOSCOPY;  Service: Endoscopy;  Laterality: N/A;   LOWER EXTREMITY ANGIOGRAPHY Right 05/09/2020   Procedure: LOWER EXTREMITY ANGIOGRAPHY;  Surgeon: Algernon Huxley, MD;  Location: [facility];  Service: Cardiovascular;  Laterality: Right;   Family History  Problem Relation Age of Onset   Heart disease Father    Breast cancer Maternal Aunt    Cancer Maternal Uncle        colon   Colon polyps Sister    Social History   Socioeconomic History   Marital status: Married    Spouse name: Not on file   Number of children: Not on file   Years of education: Not on file   Highest education level: Not on file  Occupational History   Not on file  Tobacco Use   Smoking status: Former    Years: 20.00    Types: Cigarettes    Quit date: 04/11/2020    Years since quitting: 2.4   Smokeless  tobacco: Never  Vaping Use   Vaping Use: Never used  Substance and Sexual Activity   Alcohol use: Yes   Drug use: No   Sexual activity: Not on file  Other Topics Concern   Not on file  Social History Narrative   Not on file   Social Determinants of Health   Financial Resource Strain: Low Risk  (06/01/2022)   Overall Financial Resource Strain (CARDIA)    Difficulty of Paying Living Expenses: Not hard at all  Food Insecurity: No Food Insecurity (06/01/2022)   Hunger Vital Sign    Worried About Running Out of Food in the Last Year: Never true    [place] in the Last Year: Never true  Transportation Needs: No Transportation Needs (06/01/2022)   PRAPARE - Hydrologist (Medical): No    Lack of Transportation (Non-Medical): No  Physical Activity: Not on file  Stress: No Stress Concern Present (06/01/2022)   [place]    Feeling of Stress : Not at all  Social Connections: Unknown (06/01/2022)   Social Connection and Isolation Panel [NHANES]    Frequency of Communication with Friends and Family: More than three times a week  Frequency of Social Gatherings with Friends and Family: More than three times a week    Attends Religious Services: Not on file    Active Member of Clubs or Organizations: Not on file    Attends Archivist Meetings: Not on file    Marital Status: Not on file     Review of Systems  Constitutional:  Negative for appetite change and unexpected weight change.  HENT:  Negative for congestion and sinus pressure.   Respiratory:  Negative for cough, chest tightness and shortness of breath.   Cardiovascular:  Negative for chest pain, palpitations and leg swelling.  Gastrointestinal:  Negative for abdominal pain, diarrhea, nausea and vomiting.  Genitourinary:  Negative for difficulty urinating and dysuria.  Musculoskeletal:  Negative for gait problem and joint  swelling.  Skin:  Negative for color change and rash.  Neurological:  Negative for dizziness, light-headedness and headaches.  Psychiatric/Behavioral:  Negative for agitation and dysphoric mood.        Objective:     BP 110/60 (BP Location: Left Arm, Patient Position: Sitting, Cuff Size: Normal)   Pulse 81   Temp 98 F (36.7 C) (Oral)   Ht '4\' 11"'$  (1.499 m)   Wt 114 lb (51.7 kg)   SpO2 95%   BMI 23.03 kg/m  Wt Readings from Last 3 Encounters:  09/07/22 114 lb (51.7 kg)  07/29/22 112 lb (50.8 kg)  06/01/22 112 lb (50.8 kg)    Physical Exam Vitals reviewed.  Constitutional:      General: She is not in acute distress.    Appearance: Normal appearance.  HENT:     Head: Normocephalic and atraumatic.     Right Ear: External ear normal.     Left Ear: External ear normal.  Eyes:     General: No scleral icterus.       Right eye: No discharge.        Left eye: No discharge.     Conjunctiva/sclera: Conjunctivae normal.  Neck:     Thyroid: No thyromegaly.  Cardiovascular:     Rate and Rhythm: Normal rate and regular rhythm.  Pulmonary:     Effort: No respiratory distress.     Breath sounds: Normal breath sounds. No wheezing.  Abdominal:     General: Bowel sounds are normal.     Palpations: Abdomen is soft.     Tenderness: There is no abdominal tenderness.  Musculoskeletal:        General: No swelling or tenderness.     Cervical back: Neck supple. No tenderness.  Lymphadenopathy:     Cervical: No cervical adenopathy.  Skin:    Findings: No erythema or rash.  Neurological:     Mental Status: She is alert.  Psychiatric:        Mood and Affect: Mood normal.        Behavior: Behavior normal.      Outpatient Encounter Medications as of 09/07/2022  Medication Sig   aspirin EC 81 MG tablet Take 1 tablet (81 mg total) by mouth daily.   ciclopirox (LOPROX) 0.77 % cream APPLY TOPICALLY 2 (TWO) TIMES DAILY. APPLY TWICE DAILY TO FEET AND IN BETWEEN TOES UNTIL CLEAR THEN ONE  MORE WEEK AND THEN ONCE WEEKLY FOR MAINTENANCE.   clopidogrel (PLAVIX) 75 MG tablet Take 1 tablet (75 mg total) by mouth daily.   ketoconazole (NIZORAL) 2 % shampoo apply three times per week, massage into scalp and leave in for 10 minutes before rinsing out  mometasone (ELOCON) 0.1 % lotion Apply once daily to scalp as needed for itch. Avoid applying to face, groin, and axilla. Use as directed. Risk of skin atrophy with long-term use reviewed.   rosuvastatin (CRESTOR) 10 MG tablet TAKE 1 TABLET BY MOUTH EVERY DAY   sertraline (ZOLOFT) 50 MG tablet TAKE 1 TABLET BY MOUTH EVERY DAY   No facility-administered encounter medications on file as of 09/07/2022.     Lab Results  Component Value Date   WBC 8.0 04/08/2022   HGB 12.0 04/08/2022   HCT 35.2 (L) 04/08/2022   PLT 216.0 04/08/2022   GLUCOSE 95 04/08/2022   CHOL 174 04/08/2022   TRIG 74.0 04/08/2022   HDL 106.10 04/08/2022   LDLCALC 53 04/08/2022   ALT 22 05/20/2022   AST 22 05/20/2022   NA 139 04/08/2022   K 4.3 04/08/2022   CL 105 04/08/2022   CREATININE 0.85 04/08/2022   BUN 15 04/08/2022   CO2 24 04/08/2022   TSH 2.23 04/08/2022       Assessment & Plan:   Problem List Items Addressed This Visit     Adrenal nodule ([place])   History of colonic polyps    Just had colonoscopy 07/2022 - recommended f/u in 3 years.       Humerus fracture   Hyperlipidemia    On crestor.  Low cholesterol diet and exercise.  Follow lipid panel and liver function tests.        Relevant Orders   Hepatic function panel   Basic metabolic panel   Lipid panel   Osteoporosis - Primary    Discussed bone density results.  Discussed calcium and vitamin D supplements.  Discussed prescription treatment options.  Discussed possible side effects of medication.  She is due to f/u with her dentist.  Encouraged her to discussed with her dentist - given upcoming possible need for upcoming procedure.  Will notify me if desires to start prescription  medication.       Relevant Orders   VITAMIN D 25 Hydroxy (Vit-D Deficiency, Fractures)   Urinalysis, Routine w reflex microscopic     Einar Pheasant, MD

## 2022-09-10 ENCOUNTER — Encounter: Payer: Self-pay | Admitting: Internal Medicine

## 2022-09-10 NOTE — Assessment & Plan Note (Signed)
Just had colonoscopy 07/2022 - recommended f/u in 3 years.  

## 2022-09-10 NOTE — Assessment & Plan Note (Signed)
Discussed bone density results.  Discussed calcium and vitamin D supplements.  Discussed prescription treatment options.  Discussed possible side effects of medication.  She is due to f/u with her dentist.  Encouraged her to discussed with her dentist - given upcoming possible need for upcoming procedure.  Will notify me if desires to start prescription medication.

## 2022-09-10 NOTE — Assessment & Plan Note (Signed)
On crestor.  Low cholesterol diet and exercise.  Follow lipid panel and liver function tests.   

## 2022-10-05 ENCOUNTER — Encounter (INDEPENDENT_AMBULATORY_CARE_PROVIDER_SITE_OTHER): Payer: Self-pay

## 2022-10-05 ENCOUNTER — Other Ambulatory Visit: Payer: Medicare PPO

## 2022-10-09 ENCOUNTER — Other Ambulatory Visit (INDEPENDENT_AMBULATORY_CARE_PROVIDER_SITE_OTHER): Payer: Medicare PPO

## 2022-10-09 DIAGNOSIS — M810 Age-related osteoporosis without current pathological fracture: Secondary | ICD-10-CM | POA: Diagnosis not present

## 2022-10-09 DIAGNOSIS — E785 Hyperlipidemia, unspecified: Secondary | ICD-10-CM

## 2022-10-09 LAB — URINALYSIS, ROUTINE W REFLEX MICROSCOPIC
Bilirubin Urine: NEGATIVE
Hgb urine dipstick: NEGATIVE
Ketones, ur: NEGATIVE
Leukocytes,Ua: NEGATIVE
Nitrite: NEGATIVE
Specific Gravity, Urine: 1.01 (ref 1.000–1.030)
Total Protein, Urine: NEGATIVE
Urine Glucose: NEGATIVE
Urobilinogen, UA: 0.2 (ref 0.0–1.0)
pH: 6 (ref 5.0–8.0)

## 2022-10-09 LAB — BASIC METABOLIC PANEL
BUN: 13 mg/dL (ref 6–23)
CO2: 24 mEq/L (ref 19–32)
Calcium: 9.2 mg/dL (ref 8.4–10.5)
Chloride: 105 mEq/L (ref 96–112)
Creatinine, Ser: 0.71 mg/dL (ref 0.40–1.20)
GFR: 84.52 mL/min (ref >60.00)
Glucose, Bld: 98 mg/dL (ref 70–99)
Potassium: 3.9 mEq/L (ref 3.5–5.1)
Sodium: 138 mEq/L (ref 135–145)

## 2022-10-09 LAB — LIPID PANEL
Cholesterol: 153 mg/dL (ref 0–200)
HDL: 104.3 mg/dL (ref >39.00)
LDL Cholesterol: 33 mg/dL (ref 0–99)
NonHDL: 48.26
Total CHOL/HDL Ratio: 1
Triglycerides: 74 mg/dL (ref 0.0–149.0)
VLDL: 14.8 mg/dL (ref 0.0–40.0)

## 2022-10-09 LAB — HEPATIC FUNCTION PANEL
ALT: 33 U/L (ref 0–35)
AST: 39 U/L — ABNORMAL HIGH (ref 0–37)
Albumin: 4.2 g/dL (ref 3.5–5.2)
Alkaline Phosphatase: 67 U/L (ref 39–117)
Bilirubin, Direct: 0.1 mg/dL (ref 0.0–0.3)
Total Bilirubin: 0.4 mg/dL (ref 0.2–1.2)
Total Protein: 6.9 g/dL (ref 6.0–8.3)

## 2022-10-09 LAB — VITAMIN D 25 HYDROXY (VIT D DEFICIENCY, FRACTURES): VITD: 52.66 ng/mL (ref 30.00–100.00)

## 2022-10-09 NOTE — Addendum Note (Signed)
Addended by: Neta Ehlers on: 10/09/2022 08:24 AM   Modules accepted: Orders

## 2022-10-12 ENCOUNTER — Other Ambulatory Visit: Payer: Self-pay

## 2022-10-12 DIAGNOSIS — R748 Abnormal levels of other serum enzymes: Secondary | ICD-10-CM

## 2022-11-06 ENCOUNTER — Other Ambulatory Visit (INDEPENDENT_AMBULATORY_CARE_PROVIDER_SITE_OTHER): Payer: Medicare PPO

## 2022-11-06 DIAGNOSIS — R748 Abnormal levels of other serum enzymes: Secondary | ICD-10-CM

## 2022-11-06 LAB — HEPATIC FUNCTION PANEL
ALT: 21 U/L (ref 0–35)
AST: 19 U/L (ref 0–37)
Albumin: 4.3 g/dL (ref 3.5–5.2)
Alkaline Phosphatase: 72 U/L (ref 39–117)
Bilirubin, Direct: 0.1 mg/dL (ref 0.0–0.3)
Total Bilirubin: 0.4 mg/dL (ref 0.2–1.2)
Total Protein: 6.7 g/dL (ref 6.0–8.3)

## 2023-01-07 ENCOUNTER — Ambulatory Visit (INDEPENDENT_AMBULATORY_CARE_PROVIDER_SITE_OTHER): Payer: Medicare PPO | Admitting: Dermatology

## 2023-01-07 ENCOUNTER — Encounter: Payer: Self-pay | Admitting: Dermatology

## 2023-01-07 VITALS — BP 127/75 | HR 90

## 2023-01-07 DIAGNOSIS — L309 Dermatitis, unspecified: Secondary | ICD-10-CM | POA: Diagnosis not present

## 2023-01-07 DIAGNOSIS — L578 Other skin changes due to chronic exposure to nonionizing radiation: Secondary | ICD-10-CM

## 2023-01-07 DIAGNOSIS — B353 Tinea pedis: Secondary | ICD-10-CM

## 2023-01-07 DIAGNOSIS — L304 Erythema intertrigo: Secondary | ICD-10-CM | POA: Diagnosis not present

## 2023-01-07 DIAGNOSIS — L821 Other seborrheic keratosis: Secondary | ICD-10-CM | POA: Diagnosis not present

## 2023-01-07 DIAGNOSIS — L814 Other melanin hyperpigmentation: Secondary | ICD-10-CM

## 2023-01-07 DIAGNOSIS — L219 Seborrheic dermatitis, unspecified: Secondary | ICD-10-CM

## 2023-01-07 DIAGNOSIS — Z1283 Encounter for screening for malignant neoplasm of skin: Secondary | ICD-10-CM

## 2023-01-07 DIAGNOSIS — D229 Melanocytic nevi, unspecified: Secondary | ICD-10-CM | POA: Diagnosis not present

## 2023-01-07 MED ORDER — PIMECROLIMUS 1 % EX CREA: TOPICAL_CREAM | Freq: Two times a day (BID) | CUTANEOUS | 2 refills | Status: DC

## 2023-01-07 NOTE — Progress Notes (Signed)
Follow-Up Visit   Subjective  Toni Gardner is a 72 y.o. female who presents for the following: FBSE (No hx skin cancer. ) and Seborrheic Dermatitis (Patient currently using mometasone but scalp is not clear.).  The patient presents for Total-Body Skin Exam (TBSE) for skin cancer screening and mole check.  The patient has spots, moles and lesions to be evaluated, some may be new or changing and the patient has concerns that these could be cancer.   The following portions of the chart were reviewed this encounter and updated as appropriate:   Tobacco  Allergies  Meds  Problems  Med Hx  Surg Hx  Fam Hx      Review of Systems:  No other skin or systemic complaints except as noted in HPI or Assessment and Plan.  Objective  Well appearing patient in no apparent distress; mood and affect are within normal limits.  A full examination was performed including scalp, head, eyes, ears, nose, lips, neck, chest, axillae, abdomen, back, buttocks, bilateral upper extremities, bilateral lower extremities, hands, feet, fingers, toes, fingernails, and toenails. All findings within normal limits unless otherwise noted below.  Scalp Pink thin plaques with silvery scale  bilateral feet Scaling and maceration web spaces and over distal and lateral soles.   Inframammary Folds Macerated erythematous patch inframammary folds    Assessment & Plan  Seborrheic dermatitis Scalp  Vs psoriasis  Chronic and persistent condition with duration or expected duration over one year. Condition is bothersome/symptomatic for patient. Currently flared.  Patient does have joint pain when she first gets up but pain resolves quickly.   Samples of Vtama and trade size [place] given to patient to use once daily. She will let us know which she prefers. If she likes the [facility] we can send in the foam.   Related Medications mometasone (ELOCON) 0.1 % lotion Apply once daily to scalp as needed for itch. Avoid  applying to face, groin, and axilla. Use as directed. Risk of skin atrophy with long-term use reviewed.  Tinea pedis of both feet bilateral feet  Use ciclopirox cream twice a day until rash is clear and for one more week. Use it once a week after that to prevent the fungal infection/Athlete's foot from coming back.  Patient has rx.   Related Medications ciclopirox (LOPROX) 0.77 % cream APPLY TOPICALLY 2 (TWO) TIMES DAILY. APPLY TWICE DAILY TO FEET AND IN BETWEEN TOES UNTIL CLEAR THEN ONE MORE WEEK AND THEN ONCE WEEKLY FOR MAINTENANCE.  Erythema intertrigo Inframammary Folds  Chronic and persistent condition with duration or expected duration over one year. Condition is bothersome/symptomatic for patient. Currently flared.  Intertrigo is a chronic recurrent rash that occurs in skin fold areas that may be associated with friction; heat; moisture; yeast; fungus; and bacteria.  It is exacerbated by increased movement / activity; sweating; and higher atmospheric temperature.  Start pimecrolimus twice daily to rash under breasts.   You can also try using Zoryve here. If it works well for these areas as well, we could prescribe that one for you to use at all areas instead of having multiple different creams to keep track of.   pimecrolimus (ELIDEL) 1 % cream - Inframammary Folds Apply topically 2 (two) times daily.  Hand dermatitis bilateral hands; Right Inguinal Area  Start pimecrolimus twice daily to rash at hands.  You can also try using Zoryve here. If it works well for these areas as well, we could prescribe that one for you to use  at all areas instead of having multiple different creams to keep track of.  Let me know if the rash at your hands does not clear up with these creams.    Lentigines - Scattered tan macules - Due to sun exposure - Benign-appearing, observe - Recommend daily broad spectrum sunscreen SPF 30+ to sun-exposed areas, reapply every 2 hours as needed. -  Call for any changes  Seborrheic Keratoses - Stuck-on, waxy, tan-brown papules and/or plaques  - Benign-appearing - Discussed benign etiology and prognosis. - Observe - Call for any changes  Melanocytic Nevi - Tan-brown and/or pink-flesh-colored symmetric macules and papules - Benign appearing on exam today - Observation - Call clinic for new or changing moles - Recommend daily use of broad spectrum spf 30+ sunscreen to sun-exposed areas.   Hemangiomas - Red papules - Discussed benign nature - Observe - Call for any changes  Actinic Damage - Chronic condition, secondary to cumulative UV/sun exposure - diffuse scaly erythematous macules with underlying dyspigmentation - Recommend daily broad spectrum sunscreen SPF 30+ to sun-exposed areas, reapply every 2 hours as needed.  - Staying in the shade or wearing long sleeves, sun glasses (UVA+UVB protection) and wide brim hats (4-inch brim around the entire circumference of the hat) are also recommended for sun protection.  - Call for new or changing lesions.  Skin cancer screening performed today.  Return in about 1 year (around 01/08/2024) for TBSE, 2-3 month follow up.  Graciella Belton, RMA, am acting as scribe for Forest Gleason, MD .  Documentation: I have reviewed the above documentation for accuracy and completeness, and I agree with the above.  Forest Gleason, MD

## 2023-01-07 NOTE — Patient Instructions (Addendum)
For Scalp  Start samples of Vtama or Zoryve once daily as needed to rash at scalp. Recommend trying Vtama on the Right side and Zoryve on the Left side so you can tell which works better for you.  Let the office know which you prefer and we can send in prescription.   You can also continue using the mometasone solution daily as needed for scalp.   For Hands and Under breasts  Start pimecrolimus twice daily to rash at hands and under breasts.   You can also try using Zoryve here. If it works well for these areas as well, we could prescribe that one for you to use at all areas instead of having multiple different creams to keep track of.  Let me know if the rash at your hands does not clear up with these creams.    For Feet  Use ciclopirox cream twice a day until rash is clear and for one more week. Use it once a week after that to prevent the fungal infection/Athlete's foot from coming back.  Melanoma ABCDEs  Melanoma is the most dangerous type of skin cancer, and is the leading cause of death from skin disease.  You are more likely to develop melanoma if you: Have light-colored skin, light-colored eyes, or red or blond hair Spend a lot of time in the sun Tan regularly, either outdoors or in a tanning bed Have had blistering sunburns, especially during childhood Have a close family member who has had a melanoma Have atypical moles or large birthmarks  Early detection of melanoma is key since treatment is typically straightforward and cure rates are extremely high if we catch it early.   The first sign of melanoma is often a change in a mole or a new dark spot.  The ABCDE system is a way of remembering the signs of melanoma.  A for asymmetry:  The two halves do not match. B for border:  The edges of the growth are irregular. C for color:  A mixture of colors are present instead of an even brown color. D for diameter:  Melanomas are usually (but not always) greater than 11m - the  size of a pencil eraser. E for evolution:  The spot keeps changing in size, shape, and color.  Please check your skin once per month between visits. You can use a small mirror in front and a large mirror behind you to keep an eye on the back side or your body.   If you see any new or changing lesions before your next follow-up, please call to schedule a visit.  Please continue daily skin protection including broad spectrum sunscreen SPF 30+ to sun-exposed areas, reapplying every 2 hours as needed when you're outdoors.    Due to recent changes in healthcare laws, you may see results of your pathology and/or laboratory studies on MyChart before the doctors have had a chance to review them. We understand that in some cases there may be results that are confusing or concerning to you. Please understand that not all results are received at the same time and often the doctors may need to interpret multiple results in order to provide you with the best plan of care or course of treatment. Therefore, we ask that you please give u[place]2 business days to thoroughly review all your results before contacting the office for clarification. Should we see a critical lab result, you will be contacted sooner.   If You Need Anything After Your Visit  If you have any questions or concerns for your doctor, please call our main line at [phone] and press option 4 to reach your doctor's medical assistant. If no one answers, please leave a voicemail as directed and we will return your call as soon as possible. Messages left after 4 pm will be answered the following business day.   You may also send [place] a message via [place]. We typically respond to MyChart messages within 1-2 business days.  For prescription refills, please ask your pharmacy to contact our office. Our fax number is [phone].  If you have an urgent issue when the clinic is closed that cannot wait until the next business day, you can page your doctor  at the number below.    Please note that while we do our best to be available for urgent issues outside of office hours, we are not available 24/7.   If you have an urgent issue and are unable to reach [place], you may choose to seek medical care at your doctor's office, retail clinic, urgent care center, or emergency room.  If you have a medical emergency, please immediately call 911 or go to the emergency department.  Pager Numbers  - Dr. Nehemiah Massed: [phone]  - Dr. Laurence Ferrari: [phone]  - Dr. Nicole Kindred: [phone]  In the event of inclement weather, please call our main line at [phone] for an update on the status of any delays or closures.  Dermatology Medication Tips: Please keep the boxes that topical medications come in in order to help keep track of the instructions about where and how to use these. Pharmacies typically print the medication instructions only on the boxes and not directly on the medication tubes.   If your medication is too expensive, please contact our office at [phone] option 4 or send [place] a message through [place].   We are unable to tell what your co-pay for medications will be in advance as this is different depending on your insurance coverage. However, we may be able to find a substitute medication at lower cost or fill out paperwork to get insurance to cover a needed medication.   If a prior authorization is required to get your medication covered by your insurance company, please allow [place] 1-2 business days to complete this process.  Drug prices often vary depending on where the prescription is filled and some pharmacies may offer cheaper prices.  The website www.goodrx.com contains coupons for medications through different pharmacies. The prices here do not account for what the cost may be with help from insurance (it may be cheaper with your insurance), but the website can give you the price if you did not use any insurance.  - You can print the  associated coupon and take it with your prescription to the pharmacy.  - You may also stop by our office during regular business hours and pick up a GoodRx coupon card.  - If you need your prescription sent electronically to a different pharmacy, notify our office through [facility] or by phone at [phone] option 4.     Si Usted Necesita Algo Despus de Su Visita  Tambin puede enviarnos un mensaje a travs de Pharmacist, community. Por lo general respondemos a los mensajes de MyChart en el transcurso de 1 a 2 das hbiles.  Para renovar recetas, por favor pida a su farmacia que se ponga en contacto con nuestra oficina. Harland Dingwall de fax es [place] [phone].  Si tiene un asunto urgente cuando la clnica est  cerrada y que no puede esperar hasta el siguiente da hbil, puede llamar/localizar a su doctor(a) al nmero que aparece a continuacin.   Por favor, tenga en cuenta que aunque hacemos todo lo posible para estar disponibles para asuntos urgentes fuera del horario de [place], no estamos disponibles las 24 horas del da, los 7 das de la [place].   Si tiene un problema urgente y no puede comunicarse con nosotros, puede optar por buscar atencin mdica  en el consultorio de su doctor(a), en una clnica privada, en un centro de atencin urgente o en una sala de emergencias.  Si tiene Engineering geologist, por favor llame inmediatamente al 911 o vaya a la sala de emergencias.  Nmeros de bper  - Dr. Nehemiah Massed: [phone]  - Dra. Moye: [phone]  - Dra. Nicole Kindred: [phone]  En caso de inclemencias del [place], por favor llame a Johnsie Kindred principal al [phone] para una actualizacin sobre el [place] de cualquier retraso o cierre.  Consejos para la medicacin en dermatologa: Por favor, guarde las cajas en las que vienen los medicamentos de uso tpico para ayudarle a seguir las instrucciones sobre dnde y cmo usarlos. Las farmacias generalmente imprimen las instrucciones  del medicamento slo en las cajas y no directamente en los tubos del [place].   Si su medicamento es muy caro, por favor, pngase en contacto con Zigmund Daniel llamando al [phone] y presione la opcin 4 o envenos un mensaje a travs de Pharmacist, community.   No podemos decirle cul ser su copago por los medicamentos por adelantado ya que esto es diferente dependiendo de la cobertura de su seguro. Sin embargo, es posible que podamos encontrar un medicamento sustituto a Electrical engineer un formulario para que el seguro cubra el medicamento que se considera necesario.   Si se requiere una autorizacin previa para que su compaa de seguros [place] su medicamento, por favor permtanos de 1 a 2 das hbiles para completar este proceso.  Los precios de los medicamentos varan con frecuencia dependiendo del Environmental consultant de dnde se surte la receta y alguna farmacias pueden ofrecer precios ms baratos.  El sitio web www.goodrx.com tiene cupones para medicamentos de Airline pilot. Los precios aqu no tienen en cuenta lo que podra costar con la ayuda del seguro (puede ser ms barato con su seguro), pero el sitio web puede darle el precio si no utiliz Research scientist (physical sciences).  - Puede imprimir el cupn correspondiente y llevarlo con su receta a la farmacia.  - Tambin puede pasar por nuestra oficina durante el horario de atencin regular y Charity fundraiser una tarjeta de cupones de GoodRx.  - Si necesita que su receta se enve electrnicamente a una farmacia diferente, informe a nuestra oficina a travs de MyChart de [place] o por telfono llamando al [phone] y presione la opcin 4.

## 2023-01-08 ENCOUNTER — Ambulatory Visit: Payer: Medicare PPO | Admitting: Internal Medicine

## 2023-01-08 ENCOUNTER — Encounter: Payer: Self-pay | Admitting: Internal Medicine

## 2023-01-08 ENCOUNTER — Ambulatory Visit (INDEPENDENT_AMBULATORY_CARE_PROVIDER_SITE_OTHER): Payer: Medicare PPO

## 2023-01-08 VITALS — BP 104/70 | HR 91 | Temp 98.1°F | Resp 16 | Ht 60.0 in | Wt 115.4 lb

## 2023-01-08 DIAGNOSIS — R918 Other nonspecific abnormal finding of lung field: Secondary | ICD-10-CM | POA: Diagnosis not present

## 2023-01-08 DIAGNOSIS — I70211 Atherosclerosis of native arteries of extremities with intermittent claudication, right leg: Secondary | ICD-10-CM | POA: Diagnosis not present

## 2023-01-08 DIAGNOSIS — I700 Atherosclerosis of aorta: Secondary | ICD-10-CM

## 2023-01-08 DIAGNOSIS — E278 Other specified disorders of adrenal gland: Secondary | ICD-10-CM

## 2023-01-08 DIAGNOSIS — Z86010 Personal history of colonic polyps: Secondary | ICD-10-CM | POA: Diagnosis not present

## 2023-01-08 DIAGNOSIS — E785 Hyperlipidemia, unspecified: Secondary | ICD-10-CM

## 2023-01-08 DIAGNOSIS — F439 Reaction to severe stress, unspecified: Secondary | ICD-10-CM

## 2023-01-08 DIAGNOSIS — M810 Age-related osteoporosis without current pathological fracture: Secondary | ICD-10-CM | POA: Diagnosis not present

## 2023-01-08 DIAGNOSIS — R059 Cough, unspecified: Secondary | ICD-10-CM

## 2023-01-08 MED ORDER — SERTRALINE HCL 50 MG PO TABS: 50.0000 mg | ORAL_TABLET | Freq: Every day | ORAL | 2 refills | Status: DC

## 2023-01-08 MED ORDER — ROSUVASTATIN CALCIUM 10 MG PO TABS: 10.0000 mg | ORAL_TABLET | Freq: Every day | ORAL | 2 refills | Status: DC

## 2023-01-08 NOTE — Progress Notes (Unsigned)
Subjective:    Patient ID: Toni Gardner, female    DOB: 02/11/1951, 72 y.o.   MRN: [id number]  Patient here for  Chief Complaint  Patient presents with   Medical Management of Chronic Issues    HPI Here to follow up regarding hypercholesterolemia and osteoporosis.  She reports she is doing relatively well.  Tries to stay active.  No chest pain or increased sob reported. Reports previous cough/congestion.  States is better.  In the office - minimal cough while talking.  No acid reflux reported.  No abdominal pain or bowel change reported.  Had discussed osteoporosis and treatment.  She has dental procedures planned.  Plans to discuss osteoporosis treatment with them.  Continue calcium, vitamin D and weight bearing exercise.     Past Medical History:  Diagnosis Date   Colon polyp    Dermatitis    PONV (postoperative nausea and vomiting)    Urine incontinence    Past Surgical History:  Procedure Laterality Date   ABDOMINAL HYSTERECTOMY     ADENOIDECTOMY     COLONOSCOPY  06/28/2008   Dr Bary Castilla   COLONOSCOPY WITH PROPOFOL N/A 07/29/2022   Procedure: COLONOSCOPY WITH PROPOFOL;  Surgeon: Robert Bellow, MD;  Location: [facility] ENDOSCOPY;  Service: Endoscopy;  Laterality: N/A;   LOWER EXTREMITY ANGIOGRAPHY Right 05/09/2020   Procedure: LOWER EXTREMITY ANGIOGRAPHY;  Surgeon: Algernon Huxley, MD;  Location: [facility];  Service: Cardiovascular;  Laterality: Right;   Family History  Problem Relation Age of Onset   Heart disease Father    Breast cancer Maternal Aunt    Cancer Maternal Uncle        colon   Colon polyps Sister    Social History   Socioeconomic History   Marital status: Married    Spouse name: Not on file   Number of children: Not on file   Years of education: Not on file   Highest education level: Not on file  Occupational History   Not on file  Tobacco Use   Smoking status: Former    Years: 20.00    Types: Cigarettes    Quit date: 04/11/2020    Years  since quitting: 2.7   Smokeless tobacco: Never  Vaping Use   Vaping Use: Never used  Substance and Sexual Activity   Alcohol use: Yes   Drug use: No   Sexual activity: Not on file  Other Topics Concern   Not on file  Social History Narrative   Not on file   Social Determinants of Health   Financial Resource Strain: Low Risk  (06/01/2022)   Overall Financial Resource Strain (CARDIA)    Difficulty of Paying Living Expenses: Not hard at all  Food Insecurity: No Food Insecurity (06/01/2022)   Hunger Vital Sign    Worried About Running Out of Food in the Last Year: Never true    [place] in the Last Year: Never true  Transportation Needs: No Transportation Needs (06/01/2022)   PRAPARE - Hydrologist (Medical): No    Lack of Transportation (Non-Medical): No  Physical Activity: Not on file  Stress: No Stress Concern Present (06/01/2022)   [place]    Feeling of Stress : Not at all  Social Connections: Unknown (06/01/2022)   Social Connection and Isolation Panel [NHANES]    Frequency of Communication with Friends and Family: More than three times a week  Frequency of Social Gatherings with Friends and Family: More than three times a week    Attends Religious Services: Not on file    Active Member of Clubs or Organizations: Not on file    Attends Archivist Meetings: Not on file    Marital Status: Not on file     Review of Systems  Constitutional:  Negative for appetite change and unexpected weight change.  HENT:  Negative for congestion and sinus pressure.   Respiratory:  Positive for cough. Negative for chest tightness and shortness of breath.   Cardiovascular:  Negative for chest pain, palpitations and leg swelling.  Gastrointestinal:  Negative for abdominal pain, diarrhea, nausea and vomiting.  Genitourinary:  Negative for difficulty urinating and dysuria.   Musculoskeletal:  Negative for joint swelling and myalgias.  Skin:  Negative for color change and rash.  Neurological:  Negative for dizziness and headaches.  Psychiatric/Behavioral:  Negative for agitation and dysphoric mood.        Objective:     BP 104/70   Pulse 91   Temp 98.1 F (36.7 C)   Resp 16   Ht 5' (1.524 m)   Wt 115 lb 6.4 oz (52.3 kg)   SpO2 97%   BMI 22.54 kg/m  Wt Readings from Last 3 Encounters:  01/08/23 115 lb 6.4 oz (52.3 kg)  09/07/22 114 lb (51.7 kg)  07/29/22 112 lb (50.8 kg)    Physical Exam Vitals reviewed.  Constitutional:      General: She is not in acute distress.    Appearance: Normal appearance.  HENT:     Head: Normocephalic and atraumatic.     Right Ear: External ear normal.     Left Ear: External ear normal.  Eyes:     General: No scleral icterus.       Right eye: No discharge.        Left eye: No discharge.     Conjunctiva/sclera: Conjunctivae normal.  Neck:     Thyroid: No thyromegaly.  Cardiovascular:     Rate and Rhythm: Normal rate and regular rhythm.  Pulmonary:     Effort: No respiratory distress.     Breath sounds: No wheezing.     Comments: Some decreased breath sounds - with dry crackles - base.   Abdominal:     General: Bowel sounds are normal.     Palpations: Abdomen is soft.     Tenderness: There is no abdominal tenderness.  Musculoskeletal:        General: No swelling or tenderness.     Cervical back: Neck supple. No tenderness.  Lymphadenopathy:     Cervical: No cervical adenopathy.  Skin:    Findings: No erythema or rash.  Neurological:     Mental Status: She is alert.  Psychiatric:        Mood and Affect: Mood normal.        Behavior: Behavior normal.      Outpatient Encounter Medications as of 01/08/2023  Medication Sig   aspirin EC 81 MG tablet Take 1 tablet (81 mg total) by mouth daily.   ciclopirox (LOPROX) 0.77 % cream APPLY TOPICALLY 2 (TWO) TIMES DAILY. APPLY TWICE DAILY TO FEET AND IN  BETWEEN TOES UNTIL CLEAR THEN ONE MORE WEEK AND THEN ONCE WEEKLY FOR MAINTENANCE.   clopidogrel (PLAVIX) 75 MG tablet Take 1 tablet (75 mg total) by mouth daily.   mometasone (ELOCON) 0.1 % lotion Apply once daily to scalp as needed for itch. Avoid  applying to face, groin, and axilla. Use as directed. Risk of skin atrophy with long-term use reviewed.   pimecrolimus (ELIDEL) 1 % cream Apply topically 2 (two) times daily.   rosuvastatin (CRESTOR) 10 MG tablet Take 1 tablet (10 mg total) by mouth daily.   sertraline (ZOLOFT) 50 MG tablet Take 1 tablet (50 mg total) by mouth daily.   [DISCONTINUED] rosuvastatin (CRESTOR) 10 MG tablet TAKE 1 TABLET BY MOUTH EVERY DAY   [DISCONTINUED] sertraline (ZOLOFT) 50 MG tablet TAKE 1 TABLET BY MOUTH EVERY DAY   No facility-administered encounter medications on file as of 01/08/2023.     Lab Results  Component Value Date   WBC 8.0 04/08/2022   HGB 12.0 04/08/2022   HCT 35.2 (L) 04/08/2022   PLT 216.0 04/08/2022   GLUCOSE 98 10/09/2022   CHOL 153 10/09/2022   TRIG 74.0 10/09/2022   HDL 104.30 10/09/2022   LDLCALC 33 10/09/2022   ALT 21 11/06/2022   AST 19 11/06/2022   NA 138 10/09/2022   K 3.9 10/09/2022   CL 105 10/09/2022   CREATININE 0.71 10/09/2022   BUN 13 10/09/2022   CO2 24 10/09/2022   TSH 2.23 04/08/2022    No results found.     Assessment & Plan:  Hyperlipidemia, unspecified hyperlipidemia type Assessment & Plan: On crestor.  Low cholesterol diet and exercise.  Follow lipid panel and liver function tests.    Orders: -     Basic metabolic panel; Future -     Lipid panel; Future -     Hepatic function panel; Future -     TSH; Future -     CBC with Differential/Platelet; Future  Cough, unspecified type Assessment & Plan: Reported - no significant cough.  Had minimal cough here in the office.  Lung exam as outlined.  Given exam findings and symptoms, obtain cxr.  Further w/up / treatment pending results.    Orders: -     DG  Chest 2 View; Future  Adrenal nodule [facility]) Assessment & Plan: Found as incidental finding on CT. Being followed by endocrinology.   Cortisol - suppressed.     Aortic atherosclerosis ([place]) Assessment & Plan: Continue crestor.    Atherosclerosis of native artery of right lower extremity with intermittent claudication [facility]) Assessment & Plan: Evaluated Dr Lucky Cowboy 03/24/22. ABIs today are 1.08 on the right and 0.91 on the left with biphasic to triphasic waveforms.  The right digital pressures 112 on the left digital pressure is 86.  Not currently having any worrisome symptoms.  Recheck in 1 year.   History of colonic polyps Assessment & Plan: Just had colonoscopy 07/2022 - recommended f/u in 3 years.    Osteoporosis without current pathological fracture, unspecified osteoporosis type Assessment & Plan: Discussed bone density results.  Discussed calcium and vitamin D supplements.  Have discussed prescription treatment options.  Discussed possible side effects of medication.  She is due to f/u with her dentist.  Encouraged her to discuss with her dentist - given upcoming possible need for upcoming procedure.  Will notify me if desires to start prescription medication.    Stress Assessment & Plan: On zoloft.  Overall appears to be handling things well.  Follow.     Other orders -     Rosuvastatin Calcium; Take 1 tablet (10 mg total) by mouth daily.  Dispense: 90 tablet; Refill: 2 -     Sertraline HCl; Take 1 tablet (50 mg total) by mouth daily.  Dispense: 90 tablet; Refill:  [place], MD

## 2023-01-10 ENCOUNTER — Encounter: Payer: Self-pay | Admitting: Internal Medicine

## 2023-01-10 NOTE — Assessment & Plan Note (Signed)
Just had colonoscopy 07/2022 - recommended f/u in 3 years.

## 2023-01-10 NOTE — Assessment & Plan Note (Signed)
Evaluated Dr Lucky Cowboy 03/24/22. ABIs today are 1.08 on the right and 0.91 on the left with biphasic to triphasic waveforms.  The right digital pressures 112 on the left digital pressure is 86.  Not currently having any worrisome symptoms.  Recheck in 1 year.

## 2023-01-10 NOTE — Assessment & Plan Note (Signed)
On zoloft.  Overall appears to be handling things well.  Follow.

## 2023-01-10 NOTE — Assessment & Plan Note (Signed)
Reported - no significant cough.  Had minimal cough here in the office.  Lung exam as outlined.  Given exam findings and symptoms, obtain cxr.  Further w/up / treatment pending results.

## 2023-01-10 NOTE — Assessment & Plan Note (Signed)
Discussed bone density results.  Discussed calcium and vitamin D supplements.  Have discussed prescription treatment options.  Discussed possible side effects of medication.  She is due to f/u with her dentist.  Encouraged her to discuss with her dentist - given upcoming possible need for upcoming procedure.  Will notify me if desires to start prescription medication.

## 2023-01-10 NOTE — Assessment & Plan Note (Signed)
On crestor.  Low cholesterol diet and exercise.  Follow lipid panel and liver function tests.   

## 2023-01-10 NOTE — Assessment & Plan Note (Signed)
Found as incidental finding on CT. Being followed by endocrinology.   Cortisol - suppressed.

## 2023-01-10 NOTE — Assessment & Plan Note (Signed)
Continue crestor 

## 2023-01-11 ENCOUNTER — Other Ambulatory Visit: Payer: Self-pay

## 2023-01-11 DIAGNOSIS — R059 Cough, unspecified: Secondary | ICD-10-CM

## 2023-01-11 MED ORDER — AMOXICILLIN-POT CLAVULANATE 875-125 MG PO TABS: 1.0000 | ORAL_TABLET | Freq: Two times a day (BID) | ORAL | 0 refills | Status: DC

## 2023-01-11 MED ORDER — AZITHROMYCIN 250 MG PO TABS: ORAL_TABLET | ORAL | 0 refills | Status: AC

## 2023-01-19 ENCOUNTER — Encounter: Payer: Self-pay | Admitting: Dermatology

## 2023-01-20 ENCOUNTER — Telehealth: Payer: Self-pay | Admitting: Internal Medicine

## 2023-01-20 NOTE — Telephone Encounter (Signed)
Pt called in staying that as per last visit with Dr.Scott, after she finish with her meds she will be having another chest X-Ray done. She's done with her meds, when she can come in for xray?? Any questions, she's available @336$ BP:[phone].

## 2023-01-20 NOTE — Telephone Encounter (Signed)
Pt scheduled for repeat cxr

## 2023-02-05 ENCOUNTER — Other Ambulatory Visit (INDEPENDENT_AMBULATORY_CARE_PROVIDER_SITE_OTHER): Payer: Medicare PPO

## 2023-02-05 DIAGNOSIS — E785 Hyperlipidemia, unspecified: Secondary | ICD-10-CM

## 2023-02-05 LAB — BASIC METABOLIC PANEL
BUN: 16 mg/dL (ref 6–23)
CO2: 27 mEq/L (ref 19–32)
Calcium: 9.8 mg/dL (ref 8.4–10.5)
Chloride: 106 mEq/L (ref 96–112)
Creatinine, Ser: 0.78 mg/dL (ref 0.40–1.20)
GFR: 75.33 mL/min (ref >60.00)
Glucose, Bld: 78 mg/dL (ref 70–99)
Potassium: 4.7 mEq/L (ref 3.5–5.1)
Sodium: 142 mEq/L (ref 135–145)

## 2023-02-05 LAB — LIPID PANEL
Cholesterol: 165 mg/dL (ref 0–200)
HDL: 104.7 mg/dL (ref >39.00)
LDL Cholesterol: 41 mg/dL (ref 0–99)
NonHDL: 60.38
Total CHOL/HDL Ratio: 2
Triglycerides: 95 mg/dL (ref 0.0–149.0)
VLDL: 19 mg/dL (ref 0.0–40.0)

## 2023-02-05 LAB — CBC WITH DIFFERENTIAL/PLATELET
Basophils Absolute: 0 10*3/uL (ref 0.0–0.1)
Basophils Relative: 0.5 % (ref 0.0–3.0)
Eosinophils Absolute: 0.2 10*3/uL (ref 0.0–0.7)
Eosinophils Relative: 2.7 % (ref 0.0–5.0)
HCT: 39.1 % (ref 36.0–46.0)
Hemoglobin: 13.1 g/dL (ref 12.0–15.0)
Lymphocytes Relative: 20.9 % (ref 12.0–46.0)
Lymphs Abs: 1.3 10*3/uL (ref 0.7–4.0)
MCHC: 33.6 g/dL (ref 30.0–36.0)
MCV: 92.5 fl (ref 78.0–100.0)
Monocytes Absolute: 0.6 10*3/uL (ref 0.1–1.0)
Monocytes Relative: 10.3 % (ref 3.0–12.0)
Neutro Abs: 4.2 10*3/uL (ref 1.4–7.7)
Neutrophils Relative %: 65.6 % (ref 43.0–77.0)
Platelets: 202 10*3/uL (ref 150.0–400.0)
RBC: 4.23 Mil/uL (ref 3.87–5.11)
RDW: 14.8 % (ref 11.5–15.5)
WBC: 6.3 10*3/uL (ref 4.0–10.5)

## 2023-02-05 LAB — HEPATIC FUNCTION PANEL
ALT: 21 U/L (ref 0–35)
AST: 21 U/L (ref 0–37)
Albumin: 4.1 g/dL (ref 3.5–5.2)
Alkaline Phosphatase: 71 U/L (ref 39–117)
Bilirubin, Direct: 0.1 mg/dL (ref 0.0–0.3)
Total Bilirubin: 0.4 mg/dL (ref 0.2–1.2)
Total Protein: 6.8 g/dL (ref 6.0–8.3)

## 2023-02-05 LAB — TSH: TSH: 1.23 u[IU]/mL (ref 0.35–5.50)

## 2023-02-15 ENCOUNTER — Telehealth: Payer: Self-pay

## 2023-02-15 NOTE — Telephone Encounter (Signed)
Noted  

## 2023-02-15 NOTE — Telephone Encounter (Signed)
Pt called back and I read the message to her and she verbalized understanding 

## 2023-02-15 NOTE — Telephone Encounter (Signed)
LM FOR PT TO [facility] ::   I reviewed your recent lab results and your cholesterol levels look good.  Your hemoglobin, thyroid test, kidney function tests and liver function tests are within normal limits

## 2023-03-04 ENCOUNTER — Telehealth: Payer: Self-pay | Admitting: Internal Medicine

## 2023-03-04 DIAGNOSIS — R9389 Abnormal findings on diagnostic imaging of other specified body structures: Secondary | ICD-10-CM

## 2023-03-04 NOTE — Telephone Encounter (Signed)
Patient has a lab appt 03/08/2023, there are No orders in.

## 2023-03-04 NOTE — Telephone Encounter (Signed)
Repeat cxr ordered/

## 2023-03-04 NOTE — Addendum Note (Signed)
Addended by: Roetta Sessions D on: 03/04/2023 03:47 PM   Modules accepted: Orders

## 2023-03-08 ENCOUNTER — Ambulatory Visit (INDEPENDENT_AMBULATORY_CARE_PROVIDER_SITE_OTHER): Payer: Medicare PPO

## 2023-03-08 ENCOUNTER — Other Ambulatory Visit: Payer: Medicare PPO

## 2023-03-08 DIAGNOSIS — R9389 Abnormal findings on diagnostic imaging of other specified body structures: Secondary | ICD-10-CM

## 2023-03-08 DIAGNOSIS — J439 Emphysema, unspecified: Secondary | ICD-10-CM | POA: Diagnosis not present

## 2023-03-16 ENCOUNTER — Other Ambulatory Visit: Payer: Self-pay

## 2023-03-16 DIAGNOSIS — R9389 Abnormal findings on diagnostic imaging of other specified body structures: Secondary | ICD-10-CM

## 2023-03-23 ENCOUNTER — Telehealth: Payer: Self-pay

## 2023-03-23 NOTE — Telephone Encounter (Signed)
LVM for patient to call office to reschedule appt from 4/25. Butch Penny., RMA

## 2023-03-25 ENCOUNTER — Ambulatory Visit (INDEPENDENT_AMBULATORY_CARE_PROVIDER_SITE_OTHER): Payer: Medicare PPO | Admitting: Nurse Practitioner

## 2023-03-25 ENCOUNTER — Encounter (INDEPENDENT_AMBULATORY_CARE_PROVIDER_SITE_OTHER): Payer: Medicare PPO

## 2023-03-30 ENCOUNTER — Telehealth: Payer: Self-pay | Admitting: Internal Medicine

## 2023-03-30 NOTE — Telephone Encounter (Signed)
Pt called in staying that she has a bad sinus infection, and she wanted to know if Dr. Lorin Picket can send some meds over to her pharmacy? Or wants her to come in?? We don't have any available openings for today, so she will prefer some meds? Pt would like a phone call back regarding this.

## 2023-03-30 NOTE — Telephone Encounter (Signed)
Patient scheduled with Dr Lorin Picket tomorrow morning.

## 2023-03-31 ENCOUNTER — Encounter: Payer: Self-pay | Admitting: Internal Medicine

## 2023-03-31 ENCOUNTER — Telehealth: Payer: Medicare PPO | Admitting: Internal Medicine

## 2023-03-31 VITALS — Ht 60.0 in | Wt 115.0 lb

## 2023-03-31 DIAGNOSIS — R059 Cough, unspecified: Secondary | ICD-10-CM

## 2023-03-31 DIAGNOSIS — J329 Chronic sinusitis, unspecified: Secondary | ICD-10-CM

## 2023-03-31 MED ORDER — AMOXICILLIN-POT CLAVULANATE 875-125 MG PO TABS: 1.0000 | ORAL_TABLET | Freq: Two times a day (BID) | ORAL | 0 refills | Status: DC

## 2023-03-31 NOTE — Assessment & Plan Note (Signed)
Increased sinus pressure and congestion with associated cough as outlined.  Appears to be c/w sinus infection/URI.  Covid negative several days ago.  Given progression of symptoms, will treat with augmentin.  Probiotics as directed.  Nasacort nasal spray/afrin nasal spray as directed.  Robitussin DM.  Follow.  Rest.  Fluids.  Call with update.

## 2023-03-31 NOTE — Progress Notes (Signed)
Patient ID: Toni Gardner, female   DOB: 1951-10-03, 72 y.o.   MRN: [id number]   Virtual Visit via video Note   All issues noted in this document were discussed and addressed.  No physical exam was performed (except for noted visual exam findings with Video Visits).   I connected with Sophina Mitten by a video enabled telemedicine application or telephone and verified that I am speaking with the correct person using two identifiers. Location patient: home Location provider: work  Persons participating in the virtual visit: patient, provider  The limitations, risks, security and privacy concerns of performing an evaluation and management service by video and the availability of in person appointments have been discussed.  It has also been discussed with the patient that there may be a patient responsible charge related to this service. The patient expressed understanding and agreed to proceed.  Reason for visit: work in appt.    HPI: Work in with concerns regarding a possible sinus infection. Reports that starting 7-10 days ago - developed sore throat and head congestion. Symptoms progressed.  Increased sinus pressure and nasal congestion. Increased drainage.  Throat not as sore now.  Low grade temp over the weekend.  No fever over the last several days.  Tmax 100.4.  only has taken 1-2 tylenol total.  One aspirin yesterday.  Used afrin nasal spray - on a couple of occasions.  Emesis x 2 - due to increased mucus.  Some cough.  No chest tightness or sob.  Some soreness when coughs.  No diarrhea.     ROS: See pertinent positives and negatives per HPI.  Past Medical History:  Diagnosis Date   Colon polyp    Dermatitis    PONV (postoperative nausea and vomiting)    Urine incontinence     Past Surgical History:  Procedure Laterality Date   ABDOMINAL HYSTERECTOMY     ADENOIDECTOMY     COLONOSCOPY  06/28/2008   Dr Lemar Livings   COLONOSCOPY WITH PROPOFOL N/A 07/29/2022   Procedure: COLONOSCOPY WITH  PROPOFOL;  Surgeon: Earline Mayotte, MD;  Location: [facility] ENDOSCOPY;  Service: Endoscopy;  Laterality: N/A;   LOWER EXTREMITY ANGIOGRAPHY Right 05/09/2020   Procedure: LOWER EXTREMITY ANGIOGRAPHY;  Surgeon: Annice Needy, MD;  Location: [facility] INVASIVE CV LAB;  Service: Cardiovascular;  Laterality: Right;    Family History  Problem Relation Age of Onset   Heart disease Father    Breast cancer Maternal Aunt    Cancer Maternal Uncle        colon   Colon polyps Sister     SOCIAL HX: reviewed.    Current Outpatient Medications:    amoxicillin-clavulanate (AUGMENTIN) 875-125 MG tablet, Take 1 tablet by mouth 2 (two) times daily., Disp: 20 tablet, Rfl: 0   aspirin EC 81 MG tablet, Take 1 tablet (81 mg total) by mouth daily., Disp: 150 tablet, Rfl: 2   ciclopirox (LOPROX) 0.77 % cream, APPLY TOPICALLY 2 (TWO) TIMES DAILY. APPLY TWICE DAILY TO FEET AND IN BETWEEN TOES UNTIL CLEAR THEN ONE MORE WEEK AND THEN ONCE WEEKLY FOR MAINTENANCE., Disp: 90 g, Rfl: 3   clopidogrel (PLAVIX) 75 MG tablet, Take 1 tablet (75 mg total) by mouth daily., Disp: 90 tablet, Rfl: 3   mometasone (ELOCON) 0.1 % lotion, Apply once daily to scalp as needed for itch. Avoid applying to face, groin, and axilla. Use as directed. Risk of skin atrophy with long-term use reviewed., Disp: 60 mL, Rfl: 1   pimecrolimus (ELIDEL) 1 %  cream, Apply topically 2 (two) times daily., Disp: 60 g, Rfl: 2   rosuvastatin (CRESTOR) 10 MG tablet, Take 1 tablet (10 mg total) by mouth daily., Disp: 90 tablet, Rfl: 2   sertraline (ZOLOFT) 50 MG tablet, Take 1 tablet (50 mg total) by mouth daily., Disp: 90 tablet, Rfl: 2  EXAM:  GENERAL: alert, oriented, appears well and in no acute distress  HEENT: atraumatic, conjunttiva clear, no obvious abnormalities on inspection of external nose and ears  NECK: normal movements of the head and neck  LUNGS: on inspection no signs of respiratory distress, breathing rate appears normal, no obvious gross SOB,  gasping or wheezing  CV: no obvious cyanosis  PSYCH/NEURO: pleasant and cooperative, no obvious depression or anxiety, speech and thought processing grossly intact  ASSESSMENT AND PLAN:  Discussed the following assessment and plan:  Problem List Items Addressed This Visit     Sinusitis - Primary    Increased sinus pressure and congestion with associated cough as outlined.  Appears to be c/w sinus infection/URI.  Covid negative several days ago.  Given progression of symptoms, will treat with augmentin.  Probiotics as directed.  Nasacort nasal spray/afrin nasal spray as directed.  Robitussin DM.  Follow.  Rest.  Fluids.  Call with update.       Relevant Medications   amoxicillin-clavulanate (AUGMENTIN) 875-125 MG tablet   Cough   Relevant Medications   amoxicillin-clavulanate (AUGMENTIN) 875-125 MG tablet    Return if symptoms worsen or fail to improve.   I discussed the assessment and treatment plan with the patient. The patient was provided an opportunity to ask questions and all were answered. The patient agreed with the plan and demonstrated an understanding of the instructions.   The patient was advised to call back or seek an in-person evaluation if the symptoms worsen or if the condition fails to improve as anticipated.   Dale [place], MD

## 2023-04-01 ENCOUNTER — Ambulatory Visit: Payer: Medicare PPO | Admitting: Dermatology

## 2023-04-05 DIAGNOSIS — R0602 Shortness of breath: Secondary | ICD-10-CM | POA: Diagnosis not present

## 2023-04-05 DIAGNOSIS — J849 Interstitial pulmonary disease, unspecified: Secondary | ICD-10-CM | POA: Diagnosis not present

## 2023-04-12 ENCOUNTER — Other Ambulatory Visit: Payer: Self-pay | Admitting: Pulmonary Disease

## 2023-04-12 DIAGNOSIS — R0602 Shortness of breath: Secondary | ICD-10-CM

## 2023-04-12 DIAGNOSIS — J849 Interstitial pulmonary disease, unspecified: Secondary | ICD-10-CM

## 2023-04-13 ENCOUNTER — Other Ambulatory Visit (INDEPENDENT_AMBULATORY_CARE_PROVIDER_SITE_OTHER): Payer: Self-pay | Admitting: Vascular Surgery

## 2023-04-13 DIAGNOSIS — I70211 Atherosclerosis of native arteries of extremities with intermittent claudication, right leg: Secondary | ICD-10-CM

## 2023-04-16 ENCOUNTER — Ambulatory Visit (INDEPENDENT_AMBULATORY_CARE_PROVIDER_SITE_OTHER): Payer: Medicare PPO

## 2023-04-16 ENCOUNTER — Encounter (INDEPENDENT_AMBULATORY_CARE_PROVIDER_SITE_OTHER): Payer: Self-pay | Admitting: Nurse Practitioner

## 2023-04-16 ENCOUNTER — Ambulatory Visit (INDEPENDENT_AMBULATORY_CARE_PROVIDER_SITE_OTHER): Payer: Medicare PPO | Admitting: Nurse Practitioner

## 2023-04-16 VITALS — BP 117/75 | HR 92 | Resp 18 | Ht 60.0 in | Wt 111.2 lb

## 2023-04-16 DIAGNOSIS — Z9889 Other specified postprocedural states: Secondary | ICD-10-CM

## 2023-04-16 DIAGNOSIS — E785 Hyperlipidemia, unspecified: Secondary | ICD-10-CM

## 2023-04-16 DIAGNOSIS — I739 Peripheral vascular disease, unspecified: Secondary | ICD-10-CM

## 2023-04-16 DIAGNOSIS — I70211 Atherosclerosis of native arteries of extremities with intermittent claudication, right leg: Secondary | ICD-10-CM

## 2023-04-19 ENCOUNTER — Ambulatory Visit
Admission: RE | Admit: 2023-04-19 | Discharge: 2023-04-19 | Disposition: A | Discharge status: Home / Self Care | Payer: Medicare PPO | Source: Ambulatory Visit | Attending: Pulmonary Disease | Admitting: Pulmonary Disease

## 2023-04-19 DIAGNOSIS — R0602 Shortness of breath: Secondary | ICD-10-CM | POA: Diagnosis not present

## 2023-04-19 DIAGNOSIS — J849 Interstitial pulmonary disease, unspecified: Secondary | ICD-10-CM | POA: Insufficient documentation

## 2023-04-19 DIAGNOSIS — J432 Centrilobular emphysema: Secondary | ICD-10-CM | POA: Diagnosis not present

## 2023-04-19 DIAGNOSIS — J984 Other disorders of lung: Secondary | ICD-10-CM | POA: Diagnosis not present

## 2023-04-21 ENCOUNTER — Other Ambulatory Visit: Payer: Self-pay | Admitting: Internal Medicine

## 2023-04-21 LAB — VAS US ABI WITH/WO TBI
Left ABI: 1.09
Right ABI: 1.15

## 2023-05-04 ENCOUNTER — Encounter: Payer: Self-pay | Admitting: Internal Medicine

## 2023-05-04 ENCOUNTER — Encounter (INDEPENDENT_AMBULATORY_CARE_PROVIDER_SITE_OTHER): Payer: Self-pay | Admitting: Nurse Practitioner

## 2023-05-04 DIAGNOSIS — I739 Peripheral vascular disease, unspecified: Secondary | ICD-10-CM | POA: Insufficient documentation

## 2023-05-04 NOTE — Progress Notes (Signed)
Subjective:    Patient ID: Toni Gardner, female    DOB: 1951/10/15, 72 y.o.   MRN: [id number] Chief Complaint  Patient presents with   Follow-up    Follow up 1 year abi    The patient returns to the office for followup and review of the noninvasive studies.   There have been no interval changes in lower extremity symptoms. No interval shortening of the patient's claudication distance or development of rest pain symptoms. No new ulcers or wounds have occurred since the last visit.  There have been no significant changes to the patient's overall health care.  The patient denies amaurosis fugax or recent TIA symptoms. There are no documented recent neurological changes noted. There is no history of DVT, PE or superficial thrombophlebitis. The patient denies recent episodes of angina or shortness of breath.   ABI Rt=1.15 and Lt=1.09  (previous ABI's Rt=1.08 and Lt=0.90) Duplex ultrasound of the right lower extremity shows triphasic waveforms with biphasic on the left and normal toe waveforms bilaterally    Review of Systems  All other systems reviewed and are negative.      Objective:   Physical Exam Vitals reviewed.  HENT:     Head: Normocephalic.  Cardiovascular:     Rate and Rhythm: Normal rate.     Pulses:          Dorsalis pedis pulses are detected w/ Doppler on the right side and detected w/ Doppler on the left side.       Posterior tibial pulses are detected w/ Doppler on the right side and detected w/ Doppler on the left side.  Pulmonary:     Effort: Pulmonary effort is normal.  Skin:    General: Skin is warm and dry.  Neurological:     Mental Status: She is alert and oriented to person, place, and time.  Psychiatric:        Mood and Affect: Mood normal.        Thought Content: Thought content normal.        Judgment: Judgment normal.     BP 117/75 (BP Location: Left Arm)   Pulse 92   Resp 18   Ht 5' (1.524 m)   Wt 111 lb 3.2 oz (50.4 kg)   BMI 21.72  kg/m   Past Medical History:  Diagnosis Date   Colon polyp    Dermatitis    PONV (postoperative nausea and vomiting)    Urine incontinence     Social History   Socioeconomic History   Marital status: Married    Spouse name: Not on file   Number of children: Not on file   Years of education: Not on file   Highest education level: Not on file  Occupational History   Not on file  Tobacco Use   Smoking status: Former    Years: 20    Types: Cigarettes    Quit date: 04/11/2020    Years since quitting: 3.0   Smokeless tobacco: Never  Vaping Use   Vaping Use: Never used  Substance and Sexual Activity   Alcohol use: Yes   Drug use: No   Sexual activity: Not on file  Other Topics Concern   Not on file  Social History Narrative   Not on file   Social Determinants of Health   Financial Resource Strain: Low Risk  (06/01/2022)   Overall Financial Resource Strain (CARDIA)    Difficulty of Paying Living Expenses: Not hard at all  Food  Insecurity: No Food Insecurity (06/01/2022)   Hunger Vital Sign    Worried About Running Out of Food in the Last Year: Never true    Ran Out of Food in the Last Year: Never true  Transportation Needs: No Transportation Needs (06/01/2022)   PRAPARE - Administrator, Civil Service (Medical): No    Lack of Transportation (Non-Medical): No  Physical Activity: Not on file  Stress: No Stress Concern Present (06/01/2022)   [employer] of Occupational Health - Occupational Stress Questionnaire    Feeling of Stress : Not at all  Social Connections: Unknown (06/01/2022)   Social Connection and Isolation Panel [NHANES]    Frequency of Communication with Friends and Family: More than three times a week    Frequency of Social Gatherings with Friends and Family: More than three times a week    Attends Religious Services: Not on file    Active Member of Clubs or Organizations: Not on file    Attends Banker Meetings: Not on file     Marital Status: Not on file  Intimate Partner Violence: Not At Risk (06/01/2022)   Humiliation, Afraid, Rape, and Kick questionnaire    Fear of Current or Ex-Partner: No    Emotionally Abused: No    Physically Abused: No    Sexually Abused: No    Past Surgical History:  Procedure Laterality Date   ABDOMINAL HYSTERECTOMY     ADENOIDECTOMY     COLONOSCOPY  06/28/2008   Dr Lemar Livings   COLONOSCOPY WITH PROPOFOL N/A 07/29/2022   Procedure: COLONOSCOPY WITH PROPOFOL;  Surgeon: Earline Mayotte, MD;  Location: [facility] ENDOS[facility];  Service: Endoscopy;  Laterality: N/A;   LOWER EXTREMITY ANGIOGRAPHY Right 05/09/2020   Procedure: LOWER EXTREMITY ANGIOGRAPHY;  Surgeon: Annice Needy, MD;  Location: [facility] INVASIVE [facility];  Service: Cardiovascular;  Laterality: Right;    Family History  Problem Relation Age of Onset   Heart disease Father    Breast cancer Maternal Aunt    Cancer Maternal Uncle        colon   Colon polyps Sister     Allergies  Allergen Reactions   Sulfa Antibiotics Swelling       Latest Ref Rng & Units 02/05/2023    8:54 AM 04/08/2022    7:35 AM 04/10/2020    9:37 AM  CBC  WBC 4.0 - 10.5 K/uL 6.3  8.0  9.9   Hemoglobin 12.0 - 15.0 g/dL 16.1  09.6  04.5   Hematocrit 36.0 - 46.0 % 39.1  35.2  40.7   Platelets 150.0 - 400.0 K/uL 202.0  216.0  193.0       CMP     Component Value Date/Time   NA 142 02/05/2023 0854   K 4.7 02/05/2023 0854   CL 106 02/05/2023 0854   CO2 27 02/05/2023 0854   GLUCOSE 78 02/05/2023 0854   BUN 16 02/05/2023 0854   CREATININE 0.78 02/05/2023 0854   CALCIUM 9.8 02/05/2023 0854   PROT 6.8 02/05/2023 0854   ALBUMIN 4.1 02/05/2023 0854   AST 21 02/05/2023 0854   ALT 21 02/05/2023 0854   ALKPHOS 71 02/05/2023 0854   BILITOT 0.4 02/05/2023 0854   GFRNONAA >60 05/09/2020 0757   GFRAA >60 05/09/2020 0757     VAS [place] ABI WITH/WO TBI  Result Date: 04/21/2023  LOWER EXTREMITY DOPPLER STUDY Patient Name:  Toni Gardner  Date of Exam:   04/16/2023  Medical Rec #: [id number]  Accession #:    [id number] Date of Birth: [birth year]     Patient Gender: F Patient Age:   52 years Exam Location:  [facility] Procedure:      VAS [place] ABI WITH/WO TBI Referring Phys: --------------------------------------------------------------------------------  Indications: Peripheral artery disease.  Comparison Study: 04/2022 Performing Technologist: Salvadore Farber RVT  Examination Guidelines: A complete evaluation includes at minimum, Doppler waveform signals and systolic blood pressure reading at the level of bilateral brachial, anterior tibial, and posterior tibial arteries, when vessel segments are accessible. Bilateral testing is considered an integral part of a complete examination. Photoelectric Plethysmograph (PPG) waveforms and toe systolic pressure readings are included as required and additional duplex testing as needed. Limited examinations for reoccurring indications may be performed as noted.  ABI Findings: +---------+------------------+-----+---------+--------+ Right    Rt Pressure (mmHg)IndexWaveform Comment  +---------+------------------+-----+---------+--------+ Brachial 119                                      +---------+------------------+-----+---------+--------+ ATA      134               1.12 triphasic         +---------+------------------+-----+---------+--------+ PTA      138               1.15 triphasic         +---------+------------------+-----+---------+--------+ Great Toe116               0.97 Normal            +---------+------------------+-----+---------+--------+ +---------+------------------+-----+--------+-------+ Left     Lt Pressure (mmHg)IndexWaveformComment +---------+------------------+-----+--------+-------+ Brachial 120                                    +---------+------------------+-----+--------+-------+ ATA      131               1.09 biphasic         +---------+------------------+-----+--------+-------+ PTA      122               1.02 biphasic        +---------+------------------+-----+--------+-------+ Great Toe107               0.89 Normal          +---------+------------------+-----+--------+-------+ +-------+-----------+-----------+------------+------------+ ABI/TBIToday's ABIToday's TBIPrevious ABIPrevious TBI +-------+-----------+-----------+------------+------------+ Right  1.15       .97        1.08        .89          +-------+-----------+-----------+------------+------------+ Left   1.09       .89        .90         .68          +-------+-----------+-----------+------------+------------+ Left ABIs and TBIs appear increased compared to prior study on 04/2022. Right ABIs and TBIs appear essentially unchanged.  Summary: Right: Resting right ankle-brachial index is within normal range. The right toe-brachial index is normal. Left: Resting left ankle-brachial index is within normal range. The left toe-brachial index is normal. *See table(s) above for measurements and observations.  Electronically signed by Festus Barren MD on 04/21/2023 at 7:54:19 AM.    Final        Assessment & Plan:   1. Peripheral arterial disease with history of revascularization [facility])  Recommend:  The patient  has evidence of atherosclerosis of the lower extremities with claudication.  The patient does not voice lifestyle limiting changes at this point in time.  Noninvasive studies do not suggest clinically significant change.  No invasive studies, angiography or surgery at this time The patient should continue walking and begin a more formal exercise program.  The patient should continue antiplatelet therapy and aggressive treatment of the lipid abnormalities  No changes in the patient's medications at this time  Continued surveillance is indicated as atherosclerosis is likely to progress with time.    The patient will continue follow up  with noninvasive studies as ordered.   2. Hyperlipidemia, unspecified hyperlipidemia type Continue statin as ordered and reviewed, no changes at this time   Current Outpatient Medications on File Prior to Visit  Medication Sig Dispense Refill   amoxicillin-clavulanate (AUGMENTIN) 875-125 MG tablet Take 1 tablet by mouth 2 (two) times daily. 20 tablet 0   aspirin EC 81 MG tablet Take 1 tablet (81 mg total) by mouth daily. 150 tablet 2   ciclopirox (LOPROX) 0.77 % cream APPLY TOPICALLY 2 (TWO) TIMES DAILY. APPLY TWICE DAILY TO FEET AND IN BETWEEN TOES UNTIL CLEAR THEN ONE MORE WEEK AND THEN ONCE WEEKLY FOR MAINTENANCE. 90 g 3   mometasone (ELOCON) 0.1 % lotion Apply once daily to scalp as needed for itch. Avoid applying to face, groin, and axilla. Use as directed. Risk of skin atrophy with long-term use reviewed. 60 mL 1   pimecrolimus (ELIDEL) 1 % cream Apply topically 2 (two) times daily. 60 g 2   rosuvastatin (CRESTOR) 10 MG tablet Take 1 tablet (10 mg total) by mouth daily. 90 tablet 2   sertraline (ZOLOFT) 50 MG tablet Take 1 tablet (50 mg total) by mouth daily. 90 tablet 2   TRELEGY ELLIPTA 100-62.5-25 MCG/ACT AEPB Inhale into the lungs.     No current facility-administered medications on file prior to visit.    There are no Patient Instructions on file for this visit. No follow-ups on file.   Georgiana Spinner, NP

## 2023-05-10 ENCOUNTER — Telehealth: Payer: Self-pay | Admitting: Internal Medicine

## 2023-05-10 NOTE — Telephone Encounter (Signed)
Copied from CRM [id number]. Topic: Medicare AWV >> May 10, 2023  2:44 PM Payton Doughty wrote: Reason for CRM: LM 05/10/2023 to schedule AWV   Verlee Rossetti; Care Guide Ambulatory Clinical Support [place] [facility] Direct Dial: [phone]

## 2023-05-24 ENCOUNTER — Encounter: Payer: Self-pay | Admitting: Internal Medicine

## 2023-05-24 ENCOUNTER — Ambulatory Visit (INDEPENDENT_AMBULATORY_CARE_PROVIDER_SITE_OTHER): Payer: Medicare PPO | Admitting: Internal Medicine

## 2023-05-24 VITALS — BP 108/64 | HR 88 | Temp 97.9°F | Resp 16 | Ht 60.0 in | Wt 112.0 lb

## 2023-05-24 DIAGNOSIS — F439 Reaction to severe stress, unspecified: Secondary | ICD-10-CM | POA: Diagnosis not present

## 2023-05-24 DIAGNOSIS — J449 Chronic obstructive pulmonary disease, unspecified: Secondary | ICD-10-CM

## 2023-05-24 DIAGNOSIS — I700 Atherosclerosis of aorta: Secondary | ICD-10-CM

## 2023-05-24 DIAGNOSIS — I70211 Atherosclerosis of native arteries of extremities with intermittent claudication, right leg: Secondary | ICD-10-CM

## 2023-05-24 DIAGNOSIS — E278 Other specified disorders of adrenal gland: Secondary | ICD-10-CM

## 2023-05-24 DIAGNOSIS — Z0000 Encounter for general adult medical examination without abnormal findings: Secondary | ICD-10-CM

## 2023-05-24 DIAGNOSIS — E785 Hyperlipidemia, unspecified: Secondary | ICD-10-CM | POA: Diagnosis not present

## 2023-05-24 DIAGNOSIS — Z1231 Encounter for screening mammogram for malignant neoplasm of breast: Secondary | ICD-10-CM

## 2023-05-24 DIAGNOSIS — M810 Age-related osteoporosis without current pathological fracture: Secondary | ICD-10-CM

## 2023-05-24 NOTE — Assessment & Plan Note (Addendum)
Physical today 05/24/23.  Mammogram 07/23/22 - Birads I.  Cologuard negative  10/2018.  Colonoscopy 07/29/22 -  Recommended f/u in 3 years (Dr Lemar Livings). One 15 mm polyp in the cecum, removed with a hot snare and removed piecemeal using a hot snare. Two 5 to 13 mm polyps in the transverse colon and in the proximal transverse colon, removed with a hot snare. Diverticulosis in the recto-sigmoid colon and in the sigmoid colon. Pathology:  COLON POLYP, CECUM; HOT SNARE: TUBULAR ADENOMA. NEGATIVE FOR HIGH-GRADE DYSPLASIA AND MALIGNANCY.  COLON POLYP, PROXIMAL TRANSVERSE; HOT SNARE: TUBULAR ADENOMA.  SESSILE SERRATED POLYP.  NEGATIVE FOR HIGH-GRADE DYSPLASIA AND MALIGNANCY.

## 2023-05-24 NOTE — Progress Notes (Signed)
Subjective:    Patient ID: Toni Gardner, female    DOB: 26-Feb-1951, 72 y.o.   MRN: [id number]  Patient here for  Chief Complaint  Patient presents with   Annual Exam    HPI Here for physical exam. She reports she is doing relatively well. Had follow up [facility] (04/16/23) - PAD - Noninvasive studies do not suggest clinically significant change. Saw Dr Tim Lair 04/05/23 - COPD - recommended trelegy and CT chest. CT 04/26/23 - No acute findings to explain the patient's shortness of breath. 4 mm posterior segment right upper lobe nodule. Left adrenal adenoma. Aortic atherosclerosis Coronary artery calcification. Emphysema.  Has PFTs scheduled next week and follow up with Dr Tim Lair 06/07/23.  Feels breathing is stable.  No increased cough or congestion.  No acid reflux reported.  No abdominal pain or bowel change reported.     Past Medical History:  Diagnosis Date   Colon polyp    Dermatitis    PONV (postoperative nausea and vomiting)    Urine incontinence    Past Surgical History:  Procedure Laterality Date   ABDOMINAL HYSTERECTOMY     ADENOIDECTOMY     COLONOSCOPY  06/28/2008   Dr Lemar Livings   COLONOSCOPY WITH PROPOFOL N/A 07/29/2022   Procedure: COLONOSCOPY WITH PROPOFOL;  Surgeon: Earline Mayotte, MD;  Location: [facility] ENDOSCOPY;  Service: Endoscopy;  Laterality: N/A;   LOWER EXTREMITY ANGIOGRAPHY Right 05/09/2020   Procedure: LOWER EXTREMITY ANGIOGRAPHY;  Surgeon: Annice Needy, MD;  Location: [facility] INVASIVE CV LAB;  Service: Cardiovascular;  Laterality: Right;   Family History  Problem Relation Age of Onset   Heart disease Father    Breast cancer Maternal Aunt    Cancer Maternal Uncle        colon   Colon polyps Sister    Social History   Socioeconomic History   Marital status: Married    Spouse name: Not on file   Number of children: Not on file   Years of education: Not on file   Highest education level: Not on file  Occupational History   Not on file  Tobacco Use    Smoking status: Former    Years: 20    Types: Cigarettes    Quit date: 04/11/2020    Years since quitting: 3.1   Smokeless tobacco: Never  Vaping Use   Vaping Use: Never used  Substance and Sexual Activity   Alcohol use: Yes   Drug use: No   Sexual activity: Not on file  Other Topics Concern   Not on file  Social History Narrative   Not on file   Social Determinants of Health   Financial Resource Strain: Low Risk  (06/01/2022)   Overall Financial Resource Strain (CARDIA)    Difficulty of Paying Living Expenses: Not hard at all  Food Insecurity: No Food Insecurity (06/01/2022)   Hunger Vital Sign    Worried About Running Out of Food in the Last Year: Never true    Ran Out of Food in the Last Year: Never true  Transportation Needs: No Transportation Needs (06/01/2022)   PRAPARE - Administrator, Civil Service (Medical): No    Lack of Transportation (Non-Medical): No  Physical Activity: Not on file  Stress: No Stress Concern Present (06/01/2022)   [employer] of Occupational Health - Occupational Stress Questionnaire    Feeling of Stress : Not at all  Social Connections: Unknown (06/01/2022)   Social Connection and Isolation Panel [NHANES]  Frequency of Communication with Friends and Family: More than three times a week    Frequency of Social Gatherings with Friends and Family: More than three times a week    Attends Religious Services: Not on Marketing executive or Organizations: Not on file    Attends Banker Meetings: Not on file    Marital Status: Not on file     Review of Systems  Constitutional:  Negative for appetite change and unexpected weight change.  HENT:  Negative for congestion, sinus pressure and sore throat.   Eyes:  Negative for pain and visual disturbance.  Respiratory:  Negative for cough and chest tightness.        Breathing stable.   Cardiovascular:  Negative for chest pain, palpitations and leg swelling.   Gastrointestinal:  Negative for abdominal pain, diarrhea, nausea and vomiting.  Genitourinary:  Negative for difficulty urinating and dysuria.  Musculoskeletal:  Negative for joint swelling and myalgias.  Skin:  Negative for color change and rash.  Neurological:  Negative for dizziness and headaches.  Hematological:  Negative for adenopathy. Does not bruise/bleed easily.  Psychiatric/Behavioral:  Negative for agitation and dysphoric mood.        Objective:     BP 108/64   Pulse 88   Temp 97.9 F (36.6 C)   Resp 16   Ht 5' (1.524 m)   Wt 112 lb (50.8 kg)   SpO2 98%   BMI 21.87 kg/m  Wt Readings from Last 3 Encounters:  05/24/23 112 lb (50.8 kg)  04/16/23 111 lb 3.2 oz (50.4 kg)  03/31/23 115 lb (52.2 kg)    Physical Exam Vitals reviewed.  Constitutional:      General: She is not in acute distress.    Appearance: Normal appearance. She is well-developed.  HENT:     Head: Normocephalic and atraumatic.     Right Ear: External ear normal.     Left Ear: External ear normal.  Eyes:     General: No scleral icterus.       Right eye: No discharge.        Left eye: No discharge.     Conjunctiva/sclera: Conjunctivae normal.  Neck:     Thyroid: No thyromegaly.  Cardiovascular:     Rate and Rhythm: Normal rate and regular rhythm.  Pulmonary:     Effort: No tachypnea, accessory muscle usage or respiratory distress.     Breath sounds: Normal breath sounds. No decreased breath sounds or wheezing.  Chest:  Breasts:    Right: No inverted nipple, mass, nipple discharge or tenderness (no axillary adenopathy).     Left: No inverted nipple, mass, nipple discharge or tenderness (no axilarry adenopathy).  Abdominal:     General: Bowel sounds are normal.     Palpations: Abdomen is soft.     Tenderness: There is no abdominal tenderness.  Musculoskeletal:        General: No swelling or tenderness.     Cervical back: Neck supple.  Lymphadenopathy:     Cervical: No cervical  adenopathy.  Skin:    Findings: No erythema or rash.  Neurological:     Mental Status: She is alert and oriented to person, place, and time.  Psychiatric:        Mood and Affect: Mood normal.        Behavior: Behavior normal.      Outpatient Encounter Medications as of 05/24/2023  Medication Sig   aspirin EC 81  MG tablet Take 1 tablet (81 mg total) by mouth daily.   ciclopirox (LOPROX) 0.77 % cream APPLY TOPICALLY 2 (TWO) TIMES DAILY. APPLY TWICE DAILY TO FEET AND IN BETWEEN TOES UNTIL CLEAR THEN ONE MORE WEEK AND THEN ONCE WEEKLY FOR MAINTENANCE.   clopidogrel (PLAVIX) 75 MG tablet TAKE 1 TABLET BY MOUTH EVERY DAY   rosuvastatin (CRESTOR) 10 MG tablet Take 1 tablet (10 mg total) by mouth daily.   sertraline (ZOLOFT) 50 MG tablet Take 1 tablet (50 mg total) by mouth daily.   TRELEGY ELLIPTA 100-62.5-25 MCG/ACT AEPB Inhale into the lungs.   [DISCONTINUED] amoxicillin-clavulanate (AUGMENTIN) 875-125 MG tablet Take 1 tablet by mouth 2 (two) times daily.   [DISCONTINUED] mometasone (ELOCON) 0.1 % lotion Apply once daily to scalp as needed for itch. Avoid applying to face, groin, and axilla. Use as directed. Risk of skin atrophy with long-term use reviewed.   [DISCONTINUED] pimecrolimus (ELIDEL) 1 % cream Apply topically 2 (two) times daily.   No facility-administered encounter medications on file as of 05/24/2023.     Lab Results  Component Value Date   WBC 6.3 02/05/2023   HGB 13.1 02/05/2023   HCT 39.1 02/05/2023   PLT 202.0 02/05/2023   GLUCOSE 78 02/05/2023   CHOL 165 02/05/2023   TRIG 95.0 02/05/2023   HDL 104.70 02/05/2023   LDLCALC 41 02/05/2023   ALT 21 02/05/2023   AST 21 02/05/2023   NA 142 02/05/2023   K 4.7 02/05/2023   CL 106 02/05/2023   CREATININE 0.78 02/05/2023   BUN 16 02/05/2023   CO2 27 02/05/2023   TSH 1.23 02/05/2023    CT CHEST WO CONTRAST  Result Date: 04/23/2023 CLINICAL DATA:  Shortness of breath with cough. EXAM: CT CHEST WITHOUT CONTRAST  TECHNIQUE: Multidetector CT imaging of the chest was performed following the standard protocol without IV contrast. RADIATION DOSE REDUCTION: This exam was performed according to the departmental dose-optimization program which includes automated exposure control, adjustment of the mA and/or kV according to patient size and/or use of iterative reconstruction technique. COMPARISON:  Chest radiograph 03/08/2023. FINDINGS: Cardiovascular: Atherosclerotic calcification of the aorta and coronary arteries. Heart is at the upper limits of normal in size. No pericardial effusion. Mediastinum/Nodes: No pathologically enlarged mediastinal or axillary lymph nodes. Hilar regions are difficult to definitively evaluate without IV contrast. Esophagus is grossly unremarkable. Lungs/Pleura: Biapical pleuroparenchymal scarring. Centrilobular emphysema. Bibasilar scarring. 4 mm posterior segment right upper lobe nodule (3/42). No pleural fluid. Airway is unremarkable. Upper Abdomen: Visualized portions of the liver, gallbladder and right adrenal gland are unremarkable. 1.5 cm left adrenal nodule measures 13 Hounsfield units. No follow-up necessary. Visualized portions of the kidneys, spleen, pancreas, stomach and bowel are grossly unremarkable. No upper abdominal adenopathy. Dense atherosclerotic calcification of the aorta with luminal narrowing above the renal arteries. Musculoskeletal: Degenerative changes in the spine. T7 compression fracture and kyphosis, likely old. No worrisome lytic or sclerotic lesions. IMPRESSION: 1. No acute findings to explain the patient's shortness of breath. 2. 4 mm posterior segment right upper lobe nodule. Although likely benign, if the patient is high-risk, given the morphology and/or location of this nodule a non-contrast chest CT can be considered in 12 months. This recommendation follows the consensus statement: Guidelines for Management of Incidental Pulmonary Nodules Detected on CT Images: From  the Fleischner Society 2017; Radiology 2017; 284:228-243. 3. Low-dose CT lung cancer screening is recommended for patients who are 37-16 years of age with a 20+ pack-year history of smoking, and  who are currently smoking or quit <=15 years ago. 4. Left adrenal adenoma. 5. Aortic atherosclerosis (ICD10-I70.0). Coronary artery calcification. 6.  Emphysema (ICD10-J43.9). Electronically Signed   By: Leanna Battles M.D.   On: 04/23/2023 14:49       Assessment & Plan:  Routine general medical examination at a health care facility  Healthcare maintenance Assessment & Plan: Physical today 05/24/23.  Mammogram 07/23/22 - [facility] I.  Cologuard negative  10/2018.  Colonoscopy 07/29/22 -  Recommended f/u in 3 years (Dr Lemar Livings). One 15 mm polyp in the cecum, removed with a hot snare and removed piecemeal using a hot snare. Two 5 to 13 mm polyps in the transverse colon and in the proximal transverse colon, removed with a hot snare. Diverticulosis in the recto-sigmoid colon and in the sigmoid colon. Pathology:  COLON POLYP, CECUM; HOT SNARE: TUBULAR ADENOMA. NEGATIVE FOR HIGH-GRADE DYSPLASIA AND MALIGNANCY.  COLON POLYP, PROXIMAL TRANSVERSE; HOT SNARE: TUBULAR ADENOMA.  SESSILE SERRATED POLYP.  NEGATIVE FOR HIGH-GRADE DYSPLASIA AND MALIGNANCY.   Hyperlipidemia, unspecified hyperlipidemia type Assessment & Plan: On crestor.  Low cholesterol diet and exercise.  Follow lipid panel and liver function tests.    Orders: -     Basic metabolic panel; Future -     Hepatic function panel; Future -     Lipid panel; Future  Encounter for screening mammogram for malignant neoplasm of breast -     3D Screening Mammogram, Left and Right; Future  Adrenal nodule [facility]) Assessment & Plan: Found as incidental finding on CT. Being followed by endocrinology.   Cortisol - suppressed.     Aortic atherosclerosis (HCC) Assessment & Plan: Continue crestor.    Atherosclerosis of native artery of right lower extremity with  intermittent claudication [facility]) Assessment & Plan: Had follow up [facility] (04/16/23) - PAD - Noninvasive studies do not suggest clinically significant change.    Osteoporosis without current pathological fracture, unspecified osteoporosis type Assessment & Plan: Discussed bone density results again today. Discussed calcium and vitamin D supplements.  Have discussed prescription treatment options.  Discussed possible side effects of medication.  She was planning f/u with her dentist.  Per discussion today, she is not planning dental procedures at this time. Will notify me if desires to start prescription medication.    Stress Assessment & Plan: On zoloft.  Overall appears to be handling things well.  Follow.     Chronic obstructive pulmonary disease, unspecified COPD type [facility]) Assessment & Plan: Saw Dr Tim Lair 04/05/23 - COPD - recommended trelegy and CT chest. CT 04/26/23 - No acute findings to explain the patient's shortness of breath. 4 mm posterior segment right upper lobe nodule. Left adrenal adenoma. Aortic atherosclerosis Coronary artery calcification. Emphysema.  Has PFTs scheduled next week and follow up with Dr Tim Lair 06/07/23.  Feels breathing is stable.  No increased cough or congestion.      Dale Bristol, MD

## 2023-05-29 ENCOUNTER — Encounter: Payer: Self-pay | Admitting: Internal Medicine

## 2023-05-29 DIAGNOSIS — J449 Chronic obstructive pulmonary disease, unspecified: Secondary | ICD-10-CM | POA: Insufficient documentation

## 2023-05-29 NOTE — Assessment & Plan Note (Signed)
On zoloft. Overall appears to be handling things well.  Follow.   

## 2023-05-29 NOTE — Assessment & Plan Note (Signed)
Continue crestor 

## 2023-05-29 NOTE — Assessment & Plan Note (Signed)
Had follow up [facility] (04/16/23) - PAD - Noninvasive studies do not suggest clinically significant change.

## 2023-05-29 NOTE — Assessment & Plan Note (Signed)
On crestor.  Low cholesterol diet and exercise.  Follow lipid panel and liver function tests.   

## 2023-05-29 NOTE — Assessment & Plan Note (Signed)
Discussed bone density results again today. Discussed calcium and vitamin D supplements.  Have discussed prescription treatment options.  Discussed possible side effects of medication.  She was planning f/u with her dentist.  Per discussion today, she is not planning dental procedures at this time. Will notify me if desires to start prescription medication.

## 2023-05-29 NOTE — Assessment & Plan Note (Signed)
Found as incidental finding on CT. Being followed by endocrinology.   Cortisol - suppressed.   

## 2023-05-29 NOTE — Assessment & Plan Note (Signed)
Saw Dr Tim Lair 04/05/23 - COPD - recommended trelegy and CT chest. CT 04/26/23 - No acute findings to explain the patient's shortness of breath. 4 mm posterior segment right upper lobe nodule. Left adrenal adenoma. Aortic atherosclerosis Coronary artery calcification. Emphysema.  Has PFTs scheduled next week and follow up with Dr Tim Lair 06/07/23.  Feels breathing is stable.  No increased cough or congestion.

## 2023-05-31 DIAGNOSIS — R0602 Shortness of breath: Secondary | ICD-10-CM | POA: Diagnosis not present

## 2023-06-07 DIAGNOSIS — R0609 Other forms of dyspnea: Secondary | ICD-10-CM | POA: Diagnosis not present

## 2023-06-16 ENCOUNTER — Other Ambulatory Visit (INDEPENDENT_AMBULATORY_CARE_PROVIDER_SITE_OTHER): Payer: Medicare PPO

## 2023-06-16 DIAGNOSIS — E785 Hyperlipidemia, unspecified: Secondary | ICD-10-CM

## 2023-06-16 LAB — LIPID PANEL
Cholesterol: 182 mg/dL (ref 0–200)
HDL: 109.3 mg/dL (ref >39.00)
LDL Cholesterol: 52 mg/dL (ref 0–99)
NonHDL: 72.53
Total CHOL/HDL Ratio: 2
Triglycerides: 103 mg/dL (ref 0.0–149.0)
VLDL: 20.6 mg/dL (ref 0.0–40.0)

## 2023-06-16 LAB — HEPATIC FUNCTION PANEL
ALT: 19 U/L (ref 0–35)
AST: 20 U/L (ref 0–37)
Albumin: 4.3 g/dL (ref 3.5–5.2)
Alkaline Phosphatase: 72 U/L (ref 39–117)
Bilirubin, Direct: 0.1 mg/dL (ref 0.0–0.3)
Total Bilirubin: 0.4 mg/dL (ref 0.2–1.2)
Total Protein: 7.1 g/dL (ref 6.0–8.3)

## 2023-06-16 LAB — BASIC METABOLIC PANEL
BUN: 18 mg/dL (ref 6–23)
CO2: 24 mEq/L (ref 19–32)
Calcium: 9.8 mg/dL (ref 8.4–10.5)
Chloride: 106 mEq/L (ref 96–112)
Creatinine, Ser: 0.8 mg/dL (ref 0.40–1.20)
GFR: 72.89 mL/min (ref >60.00)
Glucose, Bld: 90 mg/dL (ref 70–99)
Potassium: 4.1 mEq/L (ref 3.5–5.1)
Sodium: 140 mEq/L (ref 135–145)

## 2023-07-26 ENCOUNTER — Ambulatory Visit
Admission: RE | Admit: 2023-07-26 | Discharge: 2023-07-26 | Disposition: A | Discharge status: Home / Self Care | Payer: Medicare PPO | Source: Ambulatory Visit | Attending: Internal Medicine | Admitting: Internal Medicine

## 2023-07-26 DIAGNOSIS — Z1231 Encounter for screening mammogram for malignant neoplasm of breast: Secondary | ICD-10-CM | POA: Diagnosis not present

## 2023-08-12 ENCOUNTER — Telehealth: Payer: Self-pay | Admitting: Internal Medicine

## 2023-08-12 NOTE — Telephone Encounter (Signed)
Copied from CRM [id number]. Topic: Medicare AWV >> Aug 12, 2023 10:48 AM Payton Doughty wrote: Reason for CRM: LM 08/12/2023 to schedule AWV   Verlee Rossetti; Care Guide Ambulatory Clinical Support [facility] Direct Dial: [phone]

## 2023-08-18 ENCOUNTER — Ambulatory Visit (INDEPENDENT_AMBULATORY_CARE_PROVIDER_SITE_OTHER): Payer: Medicare PPO | Admitting: *Deleted

## 2023-08-18 VITALS — Ht 60.0 in | Wt 112.0 lb

## 2023-08-18 DIAGNOSIS — Z0000 Encounter for general adult medical examination without abnormal findings: Secondary | ICD-10-CM | POA: Diagnosis not present

## 2023-08-18 NOTE — Progress Notes (Signed)
Subjective:   Toni Gardner is a 72 y.o. female who presents for Medicare Annual (Subsequent) preventive examination.  Visit Complete: Virtual  I connected with  Helaina Cousineau Bjelland on 08/18/23 by a audio enabled telemedicine application and verified that I am speaking with the correct person using two identifiers.  Patient Location: Home  Provider Location: Office/Clinic  I discussed the limitations of evaluation and management by telemedicine. The patient expressed understanding and agreed to proceed.  Vital Signs: Unable to obtain new vitals due to this being a telehealth visit.  Review of Systems      Cardiac Risk Factors include: advanced age (>7men, >63 women);dyslipidemia;Other (see comment), Risk factor comments: PAD     Objective:    Today's Vitals   08/18/23 1248  Weight: 112 lb (50.8 kg)  Height: 5' (1.524 m)   Body mass index is 21.87 kg/m.     08/18/2023   12:59 PM 07/29/2022    8:57 AM 06/01/2022    1:39 PM 05/29/2021    4:13 PM 05/22/2020    1:08 PM 05/09/2020    7:43 AM 10/03/2017    8:26 AM  Advanced Directives  Does Patient Have a Medical Advance Directive? No No No No No No No  Would patient like information on creating a medical advance directive? No - Patient declined  No - Patient declined No - Patient declined No - Patient declined Yes (MAU/Ambulatory/Procedural Areas - Information given)     Current Medications (verified) Outpatient Encounter Medications as of 08/18/2023  Medication Sig   aspirin EC 81 MG tablet Take 1 tablet (81 mg total) by mouth daily.   ciclopirox (LOPROX) 0.77 % cream APPLY TOPICALLY 2 (TWO) TIMES DAILY. APPLY TWICE DAILY TO FEET AND IN BETWEEN TOES UNTIL CLEAR THEN ONE MORE WEEK AND THEN ONCE WEEKLY FOR MAINTENANCE.   clopidogrel (PLAVIX) 75 MG tablet TAKE 1 TABLET BY MOUTH EVERY DAY   rosuvastatin (CRESTOR) 10 MG tablet Take 1 tablet (10 mg total) by mouth daily.   sertraline (ZOLOFT) 50 MG tablet Take 1 tablet (50 mg total) by  mouth daily.   TRELEGY ELLIPTA 100-62.5-25 MCG/ACT AEPB Inhale into the lungs.   No facility-administered encounter medications on file as of 08/18/2023.    Allergies (verified) Sulfa antibiotics   History: Past Medical History:  Diagnosis Date   Colon polyp    Dermatitis    PONV (postoperative nausea and vomiting)    Urine incontinence    Past Surgical History:  Procedure Laterality Date   ABDOMINAL HYSTERECTOMY     ADENOIDECTOMY     COLONOSCOPY  06/28/2008   Dr Lemar Livings   COLONOSCOPY WITH PROPOFOL N/A 07/29/2022   Procedure: COLONOSCOPY WITH PROPOFOL;  Surgeon: Earline Mayotte, MD;  Location: [facility] ENDOSCOPY;  Service: Endoscopy;  Laterality: N/A;   LOWER EXTREMITY ANGIOGRAPHY Right 05/09/2020   Procedure: LOWER EXTREMITY ANGIOGRAPHY;  Surgeon: Annice Needy, MD;  Location: [facility] INVASIVE CV LAB;  Service: Cardiovascular;  Laterality: Right;   Family History  Problem Relation Age of Onset   Heart disease Father    Colon polyps Sister    Breast cancer Maternal Aunt 27 - 79   Cancer Maternal Uncle        colon   Social History   Socioeconomic History   Marital status: Married    Spouse name: Not on file   Number of children: Not on file   Years of education: Not on file   Highest education level: Not on file  Occupational History   Not on file  Tobacco Use   Smoking status: Former    Current packs/day: 0.00    Types: Cigarettes    Start date: 04/11/2000    Quit date: 04/11/2020    Years since quitting: 3.3   Smokeless tobacco: Never  Vaping Use   Vaping status: Never Used  Substance and Sexual Activity   Alcohol use: Yes   Drug use: No   Sexual activity: Not on file  Other Topics Concern   Not on file  Social History Narrative   Married   Works part time   Social Determinants of Health   Financial Resource Strain: Low Risk  (08/18/2023)   Overall Financial Resource Strain (CARDIA)    Difficulty of Paying Living Expenses: Not hard at all  Food Insecurity:  No Food Insecurity (08/18/2023)   Hunger Vital Sign    Worried About Running Out of Food in the Last Year: Never true    Ran Out of Food in the Last Year: Never true  Transportation Needs: No Transportation Needs (08/18/2023)   PRAPARE - Administrator, Civil Service (Medical): No    Lack of Transportation (Non-Medical): No  Physical Activity: Sufficiently Active (08/18/2023)   Exercise Vital Sign    Days of Exercise per Week: 6 days    Minutes of Exercise per Session: 30 min  Stress: No Stress Concern Present (08/18/2023)   [employer] of Occupational Health - Occupational Stress Questionnaire    Feeling of Stress : Only a little  Social Connections: Moderately Integrated (08/18/2023)   Social Connection and Isolation Panel [NHANES]    Frequency of Communication with Friends and Family: More than three times a week    Frequency of Social Gatherings with Friends and Family: More than three times a week    Attends Religious Services: More than 4 times per year    Active Member of [employer] or Organizations: No    Attends Banker Meetings: Never    Marital Status: Married    Tobacco Counseling Counseling given: Not Answered   Clinical Intake:  Pre-visit preparation completed: Yes  Pain : No/denies pain     BMI - recorded: 21.87 Nutritional Status: BMI of 19-24  Normal Nutritional Risks: None Diabetes: No  How often do you need to have someone help you when you read instructions, pamphlets, or other written materials from your doctor or pharmacy?: 1 - Never  Interpreter Needed?: No  Information entered by :: R. Manaal Mandala LPN   Activities of Daily Living    08/18/2023   12:49 PM  In your present state of health, do you have any difficulty performing the following activities:  Hearing? 1  Vision? 0  Comment glasses  Difficulty concentrating or making decisions? 0  Walking or climbing stairs? 0  Dressing or bathing? 0  Doing errands, shopping? 0   Preparing Food and eating ? N  Using the Toilet? N  In the past six months, have you accidently leaked urine? Y  Comment wears pads  Do you have problems with loss of bowel control? N  Managing your Medications? N  Managing your Finances? N  Housekeeping or managing your Housekeeping? N    Patient Care Team: Dale Calloway, MD as PCP - General (Internal Medicine)  Indicate any recent Medical Services you may have received from other than [facility] providers in the past year (date may be approximate).     Assessment:   This is a routine wellness  examination for Judea.  Hearing/Vision screen Hearing Screening - Comments:: Some issues Vision Screening - Comments:: glasses   Goals Addressed             This Visit's Progress    Patient Stated       Wants to continue to walk, more steps daily       Depression Screen    08/18/2023   12:54 PM 09/07/2022    8:06 AM 06/01/2022    1:37 PM 05/19/2022    4:33 PM 05/29/2021    3:42 PM 05/22/2020    1:10 PM 08/17/2019    9:33 AM  PHQ 2/9 Scores  PHQ - 2 Score 0 0 0 0 0 0 0  PHQ- 9 Score 0          Fall Risk    08/18/2023   12:51 PM 09/07/2022    8:05 AM 06/01/2022    1:42 PM 05/19/2022    4:32 PM 05/29/2021    4:14 PM  Fall Risk   Falls in the past year? 0 1  1 0  Number falls in past yr: 0 0  0 0  Injury with Fall? 0 1  1 0  Risk for fall due to : No Fall Risks Impaired balance/gait  History of fall(s)   Follow up Falls prevention discussed;Falls evaluation completed Falls evaluation completed Falls evaluation completed Falls evaluation completed Falls evaluation completed    MEDICARE RISK AT HOME: Medicare Risk at Home Any stairs in or around the home?: No If so, are there any without handrails?: No Home free of loose throw rugs in walkways, pet beds, electrical cords, etc?: Yes Adequate lighting in your home to reduce risk of falls?: Yes Life alert?: No Use of a cane, walker or w/c?: No Grab bars in the bathroom?:  No Shower chair or bench in shower?: Yes Elevated toilet seat or a handicapped toilet?: No  Cognitive Function:        08/18/2023   12:59 PM 05/22/2020    1:11 PM  6CIT Screen  What Year? 0 points 0 points  What month? 0 points 0 points  What time? 0 points   Count back from 20 0 points   Months in reverse 0 points 0 points  Repeat phrase 0 points 0 points  Total Score 0 points     Immunizations Immunization History  Administered Date(s) Administered   Fluad Quad(high Dose 65+) 09/01/2019, 08/26/2020   Influenza, High Dose Seasonal PF 08/25/2018   PFIZER(Purple Top)SARS-COV-2 Vaccination 01/17/2020, 02/07/2020, 09/23/2020   Pneumococcal Conjugate-13 08/05/2017   Pneumococcal Polysaccharide-23 09/08/2018    TDAP status: Due, Education has been provided regarding the importance of this vaccine. Advised may receive this vaccine at local pharmacy or Health Dept. Aware to provide a copy of the vaccination record if obtained from local pharmacy or Health Dept. Verbalized acceptance and understanding.  Flu Vaccine status: Due, Education has been provided regarding the importance of this vaccine. Advised may receive this vaccine at local pharmacy or Health Dept. Aware to provide a copy of the vaccination record if obtained from local pharmacy or Health Dept. Verbalized acceptance and understanding.  Pneumococcal vaccine status: Completed during today's visit.  Covid-19 vaccine status: Completed vaccines  Qualifies for Shingles Vaccine? Yes   Zostavax completed No   Shingrix Completed?: No.    Education has been provided regarding the importance of this vaccine. Patient has been advised to call insurance company to determine out of pocket expense if they have  not yet received this vaccine. Advised may also receive vaccine at local pharmacy or Health Dept. Verbalized acceptance and understanding. Patient will bring records  Screening Tests Health Maintenance  Topic Date Due    DTaP/Tdap/Td (1 - Tdap) Never done   Medicare Annual Wellness (AWV)  06/02/2023   INFLUENZA VACCINE  07/08/2023   COVID-19 Vaccine (4 - 2023-24 season) 08/08/2023   Zoster Vaccines- Shingrix (1 of 2) 08/24/2023 (Originally 08/21/1970)   Hepatitis C Screening  05/23/2024 (Originally 08/22/1969)   MAMMOGRAM  07/25/2025   Colonoscopy  07/29/2032   Pneumonia Vaccine 44+ Years old  Completed   DEXA SCAN  Completed   HPV VACCINES  Aged Out   Fecal DNA (Cologuard)  Discontinued    Health Maintenance  Health Maintenance Due  Topic Date Due   DTaP/Tdap/Td (1 - Tdap) Never done   Medicare Annual Wellness (AWV)  06/02/2023   INFLUENZA VACCINE  07/08/2023   COVID-19 Vaccine (4 - 2023-24 season) 08/08/2023    Colorectal cancer screening: Type of screening: Colonoscopy. Completed 8/23. Repeat every 3 years  Mammogram status: Completed 8/24. Repeat every year  Bone Density status: Completed 8/23. Results reflect: Bone density results: OSTEOPOROSIS. Repeat every 2 years.  Lung Cancer Screening: (Low Dose CT Chest recommended if Age 58-80 years, 20 pack-year currently smoking OR have quit w/in 15years.) does not qualify.    Additional Screening:  Hepatitis C Screening: does qualify; Completed postponed  Vision Screening: Recommended annual ophthalmology exams for early detection of glaucoma and other disorders of the eye. Is the patient up to date with their annual eye exam?  No  Who is the provider or what is the name of the office in which the patient attends annual eye exams? [facility], Has an upcoming appointment If pt is not established with a provider, would they like to be referred to a provider to establish care? No .   Dental Screening: Recommended annual dental exams for proper oral hygiene    Community Resource Referral / Chronic Care Management: CRR required this visit?  No   CCM required this visit?  No     Plan:     I have personally reviewed and noted the  following in the patient's chart:   Medical and social history Use of alcohol, tobacco or illicit drugs  Current medications and supplements including opioid prescriptions. Patient is not currently taking opioid prescriptions. Functional ability and status Nutritional status Physical activity Advanced directives List of other physicians Hospitalizations, surgeries, and ER visits in previous 12 months Vitals Screenings to include cognitive, depression, and falls Referrals and appointments  In addition, I have reviewed and discussed with patient certain preventive protocols, quality metrics, and best practice recommendations. A written personalized care plan for preventive services as well as general preventive health recommendations were provided to patient.     Sydell Axon, LPN   0/27/2536   After Visit Summary: (MyChart) Due to this being a telephonic visit, the after visit summary with patients personalized plan was offered to patient via MyChart   Nurse Notes: None

## 2023-08-18 NOTE — Patient Instructions (Signed)
Toni Gardner , Thank you for taking time to come for your Medicare Wellness Visit. I appreciate your ongoing commitment to your health goals. Please review the following plan we discussed and let me know if I can assist you in the future.   Referrals/Orders/Follow-Ups/Clinician Recommendations: None  This is a list of the screening recommended for you and due dates:  Health Maintenance  Topic Date Due   DTaP/Tdap/Td vaccine (1 - Tdap) Never done   Flu Shot  07/08/2023   COVID-19 Vaccine (4 - 2023-24 season) 08/08/2023   Zoster (Shingles) Vaccine (1 of 2) 08/24/2023*   Hepatitis C Screening  05/23/2024*   Medicare Annual Wellness Visit  08/17/2024   Mammogram  07/25/2025   Colon Cancer Screening  07/29/2025   Pneumonia Vaccine  Completed   DEXA scan (bone density measurement)  Completed   HPV Vaccine  Aged Out   Cologuard (Stool DNA test)  Discontinued  *Topic was postponed. The date shown is not the original due date.    Advanced directives: (Declined) Advance directive discussed with you today. Even though you declined this today, please call our office should you change your mind, and we can give you the proper paperwork for you to fill out.  Next Medicare Annual Wellness Visit scheduled for next year: Yes 08/23/24 @ 12:45

## 2023-09-09 ENCOUNTER — Other Ambulatory Visit: Payer: Self-pay | Admitting: Internal Medicine

## 2023-09-23 ENCOUNTER — Ambulatory Visit: Payer: Medicare PPO | Admitting: Internal Medicine

## 2023-09-23 ENCOUNTER — Other Ambulatory Visit: Payer: Self-pay | Admitting: Internal Medicine

## 2023-09-23 ENCOUNTER — Encounter: Payer: Self-pay | Admitting: Internal Medicine

## 2023-09-23 VITALS — BP 114/70 | HR 90 | Temp 98.2°F | Resp 16 | Ht 60.0 in | Wt 115.0 lb

## 2023-09-23 DIAGNOSIS — F439 Reaction to severe stress, unspecified: Secondary | ICD-10-CM | POA: Diagnosis not present

## 2023-09-23 DIAGNOSIS — Z860100 Personal history of colon polyps, unspecified: Secondary | ICD-10-CM

## 2023-09-23 DIAGNOSIS — I739 Peripheral vascular disease, unspecified: Secondary | ICD-10-CM

## 2023-09-23 DIAGNOSIS — E279 Disorder of adrenal gland, unspecified: Secondary | ICD-10-CM

## 2023-09-23 DIAGNOSIS — R739 Hyperglycemia, unspecified: Secondary | ICD-10-CM

## 2023-09-23 DIAGNOSIS — I700 Atherosclerosis of aorta: Secondary | ICD-10-CM | POA: Diagnosis not present

## 2023-09-23 DIAGNOSIS — J449 Chronic obstructive pulmonary disease, unspecified: Secondary | ICD-10-CM | POA: Diagnosis not present

## 2023-09-23 DIAGNOSIS — E785 Hyperlipidemia, unspecified: Secondary | ICD-10-CM

## 2023-09-23 DIAGNOSIS — Z23 Encounter for immunization: Secondary | ICD-10-CM | POA: Diagnosis not present

## 2023-09-23 DIAGNOSIS — I70211 Atherosclerosis of native arteries of extremities with intermittent claudication, right leg: Secondary | ICD-10-CM

## 2023-09-23 LAB — LIPID PANEL
Cholesterol: 174 mg/dL (ref 0–200)
HDL: 114.7 mg/dL (ref >39.00)
LDL Cholesterol: 43 mg/dL (ref 0–99)
NonHDL: 59.23
Total CHOL/HDL Ratio: 2
Triglycerides: 80 mg/dL (ref 0.0–149.0)
VLDL: 16 mg/dL (ref 0.0–40.0)

## 2023-09-23 LAB — BASIC METABOLIC PANEL
BUN: 16 mg/dL (ref 6–23)
CO2: 25 meq/L (ref 19–32)
Calcium: 9.8 mg/dL (ref 8.4–10.5)
Chloride: 103 meq/L (ref 96–112)
Creatinine, Ser: 0.83 mg/dL (ref 0.40–1.20)
GFR: 69.61 mL/min (ref >60.00)
Glucose, Bld: 205 mg/dL — ABNORMAL HIGH (ref 70–99)
Potassium: 3.6 meq/L (ref 3.5–5.1)
Sodium: 138 meq/L (ref 135–145)

## 2023-09-23 LAB — HEPATIC FUNCTION PANEL
ALT: 23 U/L (ref 0–35)
AST: 21 U/L (ref 0–37)
Albumin: 4.1 g/dL (ref 3.5–5.2)
Alkaline Phosphatase: 77 U/L (ref 39–117)
Bilirubin, Direct: 0.1 mg/dL (ref 0.0–0.3)
Total Bilirubin: 0.3 mg/dL (ref 0.2–1.2)
Total Protein: 7 g/dL (ref 6.0–8.3)

## 2023-09-23 MED ORDER — SERTRALINE HCL 50 MG PO TABS: 50.0000 mg | ORAL_TABLET | Freq: Every day | ORAL | 2 refills | Status: DC

## 2023-09-23 NOTE — Assessment & Plan Note (Signed)
[facility] 04/16/23 - ABI Rt=1.15 and Lt=1.09  (previous ABI's Rt=1.08 and Lt=0.90) Duplex ultrasound of the right lower extremity shows triphasic waveforms with biphasic on the left and normal toe waveforms bilaterally.  Recommend continuing monitoring if non invasive studies.

## 2023-09-23 NOTE — Assessment & Plan Note (Signed)
Saw Dr Tim Lair 04/05/23 - COPD - recommended trelegy and CT chest. CT 04/26/23 - No acute findings to explain the patient's shortness of breath. 4 mm posterior segment right upper lobe nodule. Left adrenal adenoma. Aortic atherosclerosis Coronary artery calcification. Emphysema. Feels breathing is stable.  No increased cough or congestion.continues on trelegy.

## 2023-09-23 NOTE — Assessment & Plan Note (Signed)
Found as incidental finding on CT. Being followed by endocrinology.   Cortisol - suppressed.

## 2023-09-23 NOTE — Assessment & Plan Note (Signed)
Just had colonoscopy 07/2022 - recommended f/u in 3 years.

## 2023-09-23 NOTE — Assessment & Plan Note (Signed)
On zoloft. Overall appears to be handling things well.  Follow.

## 2023-09-23 NOTE — Progress Notes (Signed)
Order placed for f/u labs.

## 2023-09-23 NOTE — Assessment & Plan Note (Signed)
Had follow up [facility] (04/16/23) - PAD - Noninvasive studies do not suggest clinically significant change.

## 2023-09-23 NOTE — Assessment & Plan Note (Signed)
On crestor.  Low cholesterol diet and exercise.  Follow lipid panel and liver function tests.

## 2023-09-23 NOTE — Assessment & Plan Note (Signed)
Continue crestor 

## 2023-09-23 NOTE — Progress Notes (Signed)
Subjective:    Patient ID: Toni Gardner, female    DOB: 11/11/1951, 72 y.o.   MRN: [id number]  Patient here for  Chief Complaint  Patient presents with   Medical Management of Chronic Issues    HPI Here for a scheduled follow up - here to follow up regarding increased stress - on zoloft and hypercholesterolemia.  Had follow up [facility] (04/16/23) - PAD - Noninvasive studies do not suggest clinically significant change. Saw Dr Tim Lair 04/05/23 - COPD - recommended trelegy and CT chest. CT 04/26/23 - No acute findings to explain the patient's shortness of breath. 4 mm posterior segment right upper lobe nodule. Left adrenal adenoma. Aortic atherosclerosis Coronary artery calcification. Emphysema. Reevaluated 06/07/23. Reports she is doing well on trelegy.  Breathing stable.  No increased cough or congestion.  No chest pain.  No abdominal pain or bowel change.  Some hoarseness.  Discussed rinsing mouth after trelegy use.    Past Medical History:  Diagnosis Date   Colon polyp    Dermatitis    PONV (postoperative nausea and vomiting)    Urine incontinence    Past Surgical History:  Procedure Laterality Date   ABDOMINAL HYSTERECTOMY     ADENOIDECTOMY     COLONOSCOPY  06/28/2008   Dr Lemar Livings   COLONOSCOPY WITH PROPOFOL N/A 07/29/2022   Procedure: COLONOSCOPY WITH PROPOFOL;  Surgeon: Earline Mayotte, MD;  Location: [facility] ENDOSCOPY;  Service: Endoscopy;  Laterality: N/A;   LOWER EXTREMITY ANGIOGRAPHY Right 05/09/2020   Procedure: LOWER EXTREMITY ANGIOGRAPHY;  Surgeon: Annice Needy, MD;  Location: [facility] INVASIVE CV LAB;  Service: Cardiovascular;  Laterality: Right;   Family History  Problem Relation Age of Onset   Heart disease Father    Colon polyps Sister    Breast cancer Maternal Aunt 61 - 79   Cancer Maternal Uncle        colon   Social History   Socioeconomic History   Marital status: Married    Spouse name: Not on file   Number of children: Not on file   Years of education: Not on  file   Highest education level: Bachelor's degree (e.g., BA, AB, BS)  Occupational History   Not on file  Tobacco Use   Smoking status: Former    Current packs/day: 0.00    Types: Cigarettes    Start date: 04/11/2000    Quit date: 04/11/2020    Years since quitting: 3.4   Smokeless tobacco: Never  Vaping Use   Vaping status: Never Used  Substance and Sexual Activity   Alcohol use: Yes   Drug use: No   Sexual activity: Not on file  Other Topics Concern   Not on file  Social History Narrative   Married   Works part time   Social Determinants of Corporate investment banker Strain: Low Risk  (09/22/2023)   Overall Financial Resource Strain (CARDIA)    Difficulty of Paying Living Expenses: Not hard at all  Food Insecurity: No Food Insecurity (09/22/2023)   Hunger Vital Sign    Worried About Running Out of Food in the Last Year: Never true    Ran Out of Food in the Last Year: Never true  Transportation Needs: No Transportation Needs (09/22/2023)   PRAPARE - Administrator, Civil Service (Medical): No    Lack of Transportation (Non-Medical): No  Physical Activity: Sufficiently Active (09/22/2023)   Exercise Vital Sign    Days of Exercise per Week: 5 days  Minutes of Exercise per Session: 40 min  Stress: No Stress Concern Present (09/22/2023)   [employer] of Occupational Health - Occupational Stress Questionnaire    Feeling of Stress : Not at all  Social Connections: Socially Integrated (09/22/2023)   Social Connection and Isolation Panel [NHANES]    Frequency of Communication with Friends and Family: More than three times a week    Frequency of Social Gatherings with Friends and Family: More than three times a week    Attends Religious Services: More than 4 times per year    Active Member of [employer] or Organizations: Yes    Attends Banker Meetings: 1 to 4 times per year    Marital Status: Married     Review of Systems  Constitutional:   Negative for appetite change and unexpected weight change.  HENT:  Negative for congestion and sinus pressure.   Respiratory:  Negative for cough and chest tightness.        Breathing stable.   Cardiovascular:  Negative for chest pain, palpitations and leg swelling.  Gastrointestinal:  Negative for abdominal pain, diarrhea, nausea and vomiting.  Genitourinary:  Negative for difficulty urinating and dysuria.  Musculoskeletal:  Negative for joint swelling and myalgias.  Skin:  Negative for color change and rash.  Neurological:  Negative for dizziness and headaches.  Psychiatric/Behavioral:  Negative for agitation and dysphoric mood.        Objective:     BP 114/70   Pulse 90   Temp 98.2 F (36.8 C)   Resp 16   Ht 5' (1.524 m)   Wt 115 lb (52.2 kg)   SpO2 97%   BMI 22.46 kg/m  Wt Readings from Last 3 Encounters:  09/23/23 115 lb (52.2 kg)  08/18/23 112 lb (50.8 kg)  05/24/23 112 lb (50.8 kg)    Physical Exam Vitals reviewed.  Constitutional:      General: She is not in acute distress.    Appearance: Normal appearance.  HENT:     Head: Normocephalic and atraumatic.     Right Ear: External ear normal.     Left Ear: External ear normal.  Eyes:     General: No scleral icterus.       Right eye: No discharge.        Left eye: No discharge.     Conjunctiva/sclera: Conjunctivae normal.  Neck:     Thyroid: No thyromegaly.  Cardiovascular:     Rate and Rhythm: Normal rate and regular rhythm.  Pulmonary:     Effort: No respiratory distress.     Breath sounds: Normal breath sounds. No wheezing.  Abdominal:     General: Bowel sounds are normal.     Palpations: Abdomen is soft.     Tenderness: There is no abdominal tenderness.  Musculoskeletal:        General: No swelling or tenderness.     Cervical back: Neck supple. No tenderness.  Lymphadenopathy:     Cervical: No cervical adenopathy.  Skin:    Findings: No erythema or rash.  Neurological:     Mental Status: She is  alert.  Psychiatric:        Mood and Affect: Mood normal.        Behavior: Behavior normal.      Outpatient Encounter Medications as of 09/23/2023  Medication Sig   aspirin EC 81 MG tablet Take 1 tablet (81 mg total) by mouth daily.   ciclopirox (LOPROX) 0.77 % cream APPLY TOPICALLY 2 (  TWO) TIMES DAILY. APPLY TWICE DAILY TO FEET AND IN BETWEEN TOES UNTIL CLEAR THEN ONE MORE WEEK AND THEN ONCE WEEKLY FOR MAINTENANCE.   clopidogrel (PLAVIX) 75 MG tablet TAKE 1 TABLET BY MOUTH EVERY DAY   rosuvastatin (CRESTOR) 10 MG tablet TAKE 1 TABLET BY MOUTH EVERY DAY   sertraline (ZOLOFT) 50 MG tablet Take 1 tablet (50 mg total) by mouth daily.   TRELEGY ELLIPTA 100-62.5-25 MCG/ACT AEPB Inhale into the lungs.   [DISCONTINUED] sertraline (ZOLOFT) 50 MG tablet Take 1 tablet (50 mg total) by mouth daily.   No facility-administered encounter medications on file as of 09/23/2023.     Lab Results  Component Value Date   WBC 6.3 02/05/2023   HGB 13.1 02/05/2023   HCT 39.1 02/05/2023   PLT 202.0 02/05/2023   GLUCOSE 90 06/16/2023   CHOL 182 06/16/2023   TRIG 103.0 06/16/2023   HDL 109.30 06/16/2023   LDLCALC 52 06/16/2023   ALT 19 06/16/2023   AST 20 06/16/2023   NA 140 06/16/2023   K 4.1 06/16/2023   CL 106 06/16/2023   CREATININE 0.80 06/16/2023   BUN 18 06/16/2023   CO2 24 06/16/2023   TSH 1.23 02/05/2023    MM 3D SCREENING MAMMOGRAM BILATERAL BREAST  Result Date: 07/28/2023 CLINICAL DATA:  Screening. EXAM: DIGITAL SCREENING BILATERAL MAMMOGRAM WITH TOMOSYNTHESIS AND CAD TECHNIQUE: Bilateral screening digital craniocaudal and mediolateral oblique mammograms were obtained. Bilateral screening digital breast tomosynthesis was performed. The images were evaluated with computer-aided detection. COMPARISON:  Previous exam(s). ACR Breast Density Category c: The breasts are heterogeneously dense, which may obscure small masses. FINDINGS: There are no findings suspicious for malignancy. IMPRESSION:  No mammographic evidence of malignancy. A result letter of this screening mammogram will be mailed directly to the patient. RECOMMENDATION: Screening mammogram in one year. (Code:SM-B-01Y) BI-RADS CATEGORY  1: Negative. Electronically Signed   By: Meda Klinefelter M.D.   On: 07/28/2023 12:15       Assessment & Plan:  Hyperlipidemia, unspecified hyperlipidemia type Assessment & Plan: On crestor.  Low cholesterol diet and exercise.  Follow lipid panel and liver function tests.    Orders: -     Basic metabolic panel -     Hepatic function panel -     Lipid panel  Need for influenza vaccination -     Flu Vaccine Trivalent High Dose (Fluad)  Stress Assessment & Plan: On zoloft.  Overall appears to be handling things well.  Follow.     PAD (peripheral artery disease) (HCC) Assessment & Plan: [facility] 04/16/23 - ABI Rt=1.15 and Lt=1.09  (previous ABI's Rt=1.08 and Lt=0.90) Duplex ultrasound of the right lower extremity shows triphasic waveforms with biphasic on the left and normal toe waveforms bilaterally.  Recommend continuing monitoring if non invasive studies.    History of colonic polyps Assessment & Plan: Just had colonoscopy 07/2022 - recommended f/u in 3 years.    Chronic obstructive pulmonary disease, unspecified COPD type [facility]) Assessment & Plan: Saw Dr Tim Lair 04/05/23 - COPD - recommended trelegy and CT chest. CT 04/26/23 - No acute findings to explain the patient's shortness of breath. 4 mm posterior segment right upper lobe nodule. Left adrenal adenoma. Aortic atherosclerosis Coronary artery calcification. Emphysema. Feels breathing is stable.  No increased cough or congestion.continues on trelegy.     Atherosclerosis of native artery of right lower extremity with intermittent claudication [facility]) Assessment & Plan: Had follow up [facility] (04/16/23) - PAD - Noninvasive studies do not suggest clinically significant  change.    Aortic atherosclerosis (HCC) Assessment &  Plan: Continue crestor.    Adrenal nodule [facility]) Assessment & Plan: Found as incidental finding on CT. Being followed by endocrinology.   Cortisol - suppressed.     Other orders -     Sertraline HCl; Take 1 tablet (50 mg total) by mouth daily.  Dispense: 90 tablet; Refill: 2     Dale [place], MD

## 2023-10-08 ENCOUNTER — Other Ambulatory Visit: Payer: Medicare PPO

## 2023-10-12 DIAGNOSIS — R0609 Other forms of dyspnea: Secondary | ICD-10-CM | POA: Diagnosis not present

## 2023-10-13 ENCOUNTER — Other Ambulatory Visit: Payer: Self-pay | Admitting: Internal Medicine

## 2023-10-29 ENCOUNTER — Other Ambulatory Visit (INDEPENDENT_AMBULATORY_CARE_PROVIDER_SITE_OTHER): Payer: Medicare PPO

## 2023-10-29 DIAGNOSIS — R739 Hyperglycemia, unspecified: Secondary | ICD-10-CM

## 2023-10-29 LAB — HEMOGLOBIN A1C: Hgb A1c MFr Bld: 5.9 % (ref 4.6–6.5)

## 2023-10-30 LAB — GLUCOSE, FASTING: Glucose, Bld: 93 mg/dL (ref 65–99)

## 2023-11-01 ENCOUNTER — Telehealth: Payer: Self-pay

## 2023-11-01 NOTE — Telephone Encounter (Signed)
Patient states she would like for Rita Ohara, LPN, to please call her regarding her test results.  Patient states when she looks on MyChart, everything looks normal range, so she just has questions about that.

## 2023-11-01 NOTE — Telephone Encounter (Signed)
Patient just called back. I read her the message.

## 2023-11-01 NOTE — Telephone Encounter (Signed)
Noted  

## 2023-11-01 NOTE — Telephone Encounter (Signed)
-----   Message from [place] sent at 10/30/2023  4:59 PM EST ----- Notify - her overall sugar control is slightly increased.  Continue to monitor carb intake.  We will follow.

## 2023-11-02 NOTE — Telephone Encounter (Signed)
Discussed with patient. No further concerns. Advised labs were ok. Just a slight increase. Patient is ok

## 2023-11-12 NOTE — Telephone Encounter (Signed)
Error

## 2023-12-07 ENCOUNTER — Encounter: Payer: Self-pay | Admitting: Family Medicine

## 2023-12-07 ENCOUNTER — Telehealth (INDEPENDENT_AMBULATORY_CARE_PROVIDER_SITE_OTHER): Payer: Medicare PPO | Admitting: Family Medicine

## 2023-12-07 VITALS — Ht 60.0 in | Wt 112.0 lb

## 2023-12-07 DIAGNOSIS — J329 Chronic sinusitis, unspecified: Secondary | ICD-10-CM

## 2023-12-07 MED ORDER — AMOXICILLIN-POT CLAVULANATE 875-125 MG PO TABS: 1.0000 | ORAL_TABLET | Freq: Two times a day (BID) | ORAL | 0 refills | Status: AC

## 2023-12-07 NOTE — Patient Instructions (Signed)
 It was a pleasure meeting you today. Thank you for allowing me to take part in your health care.  Our goals for today as we discussed include:  Start Augmentin  1 tablet two times a day for 5 days Start Probiotics daily and continue for 14 after days after completion of antibiotics  You can take Tylenol  and/or Ibuprofen as needed for fever reduction and pain relief.   For cough: honey 1/2 to 1 teaspoon (you can dilute the honey in water or another fluid).  You can also use guaifenesin and dextromethorphan for cough. You can use a humidifier for chest congestion and cough.  If you don't have a humidifier, you can sit in the bathroom with the hot shower running.      For sore throat: try warm salt water gargles, cepacol lozenges, throat spray, warm tea or water with lemon/honey, popsicles or ice, or OTC cold relief medicine for throat discomfort.   For congestion: take a daily anti-histamine like Zyrtec, Claritin, and a oral decongestant, such as pseudoephedrine.  You can also use Flonase  1-2 sprays in each nostril daily.   It is important to stay hydrated: drink plenty of fluids (water, gatorade/powerade/pedialyte, juices, or teas) to keep your throat moisturized and help further relieve irritation/discomfort.    This is a list of the screening recommended for you and due dates:  Health Maintenance  Topic Date Due   DTaP/Tdap/Td vaccine (1 - Tdap) Never done   COVID-19 Vaccine (4 - 2024-25 season) 08/08/2023   Hepatitis C Screening  05/23/2024*   Medicare Annual Wellness Visit  08/17/2024   Mammogram  07/25/2025   Colon Cancer Screening  07/29/2025   Pneumonia Vaccine  Completed   Flu Shot  Completed   DEXA scan (bone density measurement)  Completed   Zoster (Shingles) Vaccine  Completed   HPV Vaccine  Aged Out   Cologuard (Stool DNA test)  Discontinued  *Topic was postponed. The date shown is not the original due date.     Follow up as needed.   If you have any questions or  concerns, please do not hesitate to call the office at [phone].  I look forward to our next visit and until then take care and stay safe.  Regards,   Glenys Ferrari, MD   [facility]

## 2023-12-07 NOTE — Progress Notes (Signed)
 Virtual Visit via Video note  I connected with Toni Gardner on 12/10/23 at 0800 by video and verified that I am speaking with the correct person using two identifiers. Toni Gardner is currently located at home and alone is currently with her during visit. The provider, Glenys Ferrari, MD is located in their office at time of visit.  I discussed the limitations, risks, security and privacy concerns of performing an evaluation and management service by video and the availability of in person appointments. I also discussed with the patient that there may be a patient responsible charge related to this service. The patient expressed understanding and agreed to proceed.  Subjective: PCP: Toni Shad, MD  Chief Complaint  Patient presents with   Sinusitis    X 1 week blood and green mucus from the nose congestion in head and nasal     HPI Discussed the use of AI scribe software for clinical note transcription with the patient, who gave verbal consent to proceed.  History of Present Illness The patient, born on Sep 27, 1951, presented with symptoms that began last Thursday, starting with significant head congestion. This was followed by a runny nose, with the patient noting both green and bloody discharge when blowing her nose. She also reported a mild cough, which she is trying to prevent from worsening, as she has a history of sinus infections. The patient reported a possible low-grade fever but denied experiencing any chills. She confirmed experiencing headaches and sinus pressure, along with a runny nose, but denied any difficulty swallowing. The patient noted the onset of a cough but denied coughing up anything or experiencing any shortness of breath or wheezing. She confirmed daily use of Trelegy Ellipta. The patient was recently around young children during the Christmas holiday. She reported normal eating and drinking habits. The patient's husband was also reported to be experiencing similar  symptoms, suggesting a possible viral infection. The patient confirmed she had received two COVID vaccines but not the most recent one, and also confirmed receiving the flu vaccine. She reported a known allergy to sulfa drugs. The patient has had similar symptoms in the past and has previously been treated with amoxicillin  or Augmentin  for sinus infections. The patient's last antibiotic treatment was approximately a year ago, prescribed by Dr. Glendia for sinusitis. The patient confirmed that Augmentin  was effective in treating her symptoms at that time.   ROS: Per HPI  Current Outpatient Medications:    amoxicillin -clavulanate (AUGMENTIN ) 875-125 MG tablet, Take 1 tablet by mouth 2 (two) times daily for 5 days., Disp: 10 tablet, Rfl: 0   aspirin  EC 81 MG tablet, Take 1 tablet (81 mg total) by mouth daily., Disp: 150 tablet, Rfl: 2   ciclopirox  (LOPROX ) 0.77 % cream, APPLY TOPICALLY 2 (TWO) TIMES DAILY. APPLY TWICE DAILY TO FEET AND IN BETWEEN TOES UNTIL CLEAR THEN ONE MORE WEEK AND THEN ONCE WEEKLY FOR MAINTENANCE., Disp: 90 g, Rfl: 3   clopidogrel  (PLAVIX ) 75 MG tablet, TAKE 1 TABLET BY MOUTH EVERY DAY, Disp: 90 tablet, Rfl: 1   rosuvastatin  (CRESTOR ) 10 MG tablet, TAKE 1 TABLET BY MOUTH EVERY DAY, Disp: 90 tablet, Rfl: 2   sertraline  (ZOLOFT ) 50 MG tablet, TAKE 1 TABLET BY MOUTH EVERY DAY, Disp: 90 tablet, Rfl: 2   TRELEGY ELLIPTA 100-62.5-25 MCG/ACT AEPB, Inhale into the lungs., Disp: , Rfl:   Observations/Objective: Physical Exam HENT:     Nose: Congestion present.  Pulmonary:     Effort: Pulmonary effort is normal.  Neurological:     Mental Status: She is alert and oriented to person, place, and time. Mental status is at baseline.  Psychiatric:        Mood and Affect: Mood normal.        Behavior: Behavior normal.        Thought Content: Thought content normal.        Judgment: Judgment normal.    Assessment and Plan: Sinusitis, unspecified chronicity, unspecified  location Assessment & Plan: Symptoms of congestion, runny nose, mild cough, and headache started last Thursday. No fever, chills, or difficulty swallowing. No shortness of breath or wheezing. Patient has a history of sinus infections and is currently using Trelegy Ellipta daily. Possible exposure to viral infection during recent holiday gathering. -Prescribe Augmentin , one tablet two times a day for five days. -Advise to start probiotics daily and continue for at least 14 days after treatment. -Provide additional recommendations for cough and cold symptoms on after visit summary. -Follow-up as needed, and advise patient to contact the office if symptoms worsen or if she develops chest pain, shortness of breath, or fever.   Orders: -     Amoxicillin -Pot Clavulanate; Take 1 tablet by mouth 2 (two) times daily for 5 days.  Dispense: 10 tablet; Refill: 0     Follow Up Instructions: Return if symptoms worsen or fail to improve, for PCP.   I discussed the assessment and treatment plan with the patient. The patient was provided an opportunity to ask questions and all were answered. The patient agreed with the plan and demonstrated an understanding of the instructions.   The patient was advised to call back or seek an in-person evaluation if the symptoms worsen or if the condition fails to improve as anticipated.  The above assessment and management plan was discussed with the patient. The patient verbalized understanding of and has agreed to the management plan. Patient is aware to call the clinic if symptoms persist or worsen. Patient is aware when to return to the clinic for a follow-up visit. Patient educated on when it is appropriate to go to the emergency department.     Glenys Ferrari, MD

## 2023-12-10 ENCOUNTER — Encounter: Payer: Self-pay | Admitting: Family Medicine

## 2023-12-10 NOTE — Assessment & Plan Note (Signed)
 Symptoms of congestion, runny nose, mild cough, and headache started last Thursday. No fever, chills, or difficulty swallowing. No shortness of breath or wheezing. Patient has a history of sinus infections and is currently using Trelegy Ellipta daily. Possible exposure to viral infection during recent holiday gathering. -Prescribe Augmentin , one tablet two times a day for five days. -Advise to start probiotics daily and continue for at least 14 days after treatment. -Provide additional recommendations for cough and cold symptoms on after visit summary. -Follow-up as needed, and advise patient to contact the office if symptoms worsen or if she develops chest pain, shortness of breath, or fever.

## 2023-12-20 DIAGNOSIS — J449 Chronic obstructive pulmonary disease, unspecified: Secondary | ICD-10-CM | POA: Diagnosis not present

## 2023-12-20 DIAGNOSIS — R911 Solitary pulmonary nodule: Secondary | ICD-10-CM | POA: Diagnosis not present

## 2023-12-20 DIAGNOSIS — Z87891 Personal history of nicotine dependence: Secondary | ICD-10-CM | POA: Diagnosis not present

## 2024-01-13 ENCOUNTER — Encounter: Payer: Self-pay | Admitting: Dermatology

## 2024-01-13 ENCOUNTER — Ambulatory Visit: Payer: Medicare PPO | Admitting: Dermatology

## 2024-01-13 DIAGNOSIS — L814 Other melanin hyperpigmentation: Secondary | ICD-10-CM | POA: Diagnosis not present

## 2024-01-13 DIAGNOSIS — D692 Other nonthrombocytopenic purpura: Secondary | ICD-10-CM | POA: Diagnosis not present

## 2024-01-13 DIAGNOSIS — W098XXA Fall on or from other playground equipment, initial encounter: Secondary | ICD-10-CM | POA: Diagnosis not present

## 2024-01-13 DIAGNOSIS — D1801 Hemangioma of skin and subcutaneous tissue: Secondary | ICD-10-CM | POA: Diagnosis not present

## 2024-01-13 DIAGNOSIS — Z1283 Encounter for screening for malignant neoplasm of skin: Secondary | ICD-10-CM

## 2024-01-13 DIAGNOSIS — L821 Other seborrheic keratosis: Secondary | ICD-10-CM | POA: Diagnosis not present

## 2024-01-13 DIAGNOSIS — L578 Other skin changes due to chronic exposure to nonionizing radiation: Secondary | ICD-10-CM | POA: Diagnosis not present

## 2024-01-13 DIAGNOSIS — D229 Melanocytic nevi, unspecified: Secondary | ICD-10-CM

## 2024-01-13 NOTE — Patient Instructions (Signed)

## 2024-01-13 NOTE — Progress Notes (Signed)
   Follow-Up Visit   Subjective  Toni Gardner is a 73 y.o. female who presents for the following: Skin Cancer Screening and Full Body Skin Exam, Seb derm vs Psoriasis scalp, not currently treating, skin tag L leg  The patient presents for Total-Body Skin Exam (TBSE) for skin cancer screening and mole check. The patient has spots, moles and lesions to be evaluated, some may be new or changing and the patient may have concern these could be cancer.    The following portions of the chart were reviewed this encounter and updated as appropriate: medications, allergies, medical history  Review of Systems:  No other skin or systemic complaints except as noted in HPI or Assessment and Plan.  Objective  Well appearing patient in no apparent distress; mood and affect are within normal limits.  A full examination was performed including scalp, head, eyes, ears, nose, lips, neck, chest, axillae, abdomen, back, buttocks, bilateral upper extremities, bilateral lower extremities, hands, feet, fingers, toes, fingernails, and toenails. All findings within normal limits unless otherwise noted below.   Exam of face limited by presence of make-up.  Relevant physical exam findings are noted in the Assessment and Plan.    Assessment & Plan   SKIN CANCER SCREENING PERFORMED TODAY.  ACTINIC DAMAGE - Chronic condition, secondary to cumulative UV/sun exposure - diffuse scaly erythematous macules with underlying dyspigmentation - Recommend daily broad spectrum sunscreen SPF 30+ to sun-exposed areas, reapply every 2 hours as needed.  - Staying in the shade or wearing long sleeves, sun glasses (UVA+UVB protection) and wide brim hats (4-inch brim around the entire circumference of the hat) are also recommended for sun protection.  - Call for new or changing lesions.  LENTIGINES, SEBORRHEIC KERATOSES, HEMANGIOMAS - Benign normal skin lesions - Benign-appearing - Call for any changes - SK L medial  thigh  MELANOCYTIC NEVI - Tan-brown and/or pink-flesh-colored symmetric macules and papules - Benign appearing on exam today - Observation - Call clinic for new or changing moles - Recommend daily use of broad spectrum spf 30+ sunscreen to sun-exposed areas.   Purpura - Chronic; persistent and recurrent.  Treatable, but not curable. R arm - Violaceous macules and patches - Benign - Related to trauma, age, sun damage and/or use of blood thinners, chronic use of topical and/or oral steroids - Observe - Can use OTC arnica containing moisturizer such as Dermend Bruise Formula if desired - Call for worsening or other concerns    Return in about 1 year (around 01/12/2025) for TBSE.  I, Grayce Saunas, RMA, am acting as scribe for Boneta Sharps, MD .   Documentation: I have reviewed the above documentation for accuracy and completeness, and I agree with the above.  Boneta Sharps, MD

## 2024-01-24 ENCOUNTER — Ambulatory Visit: Payer: Medicare PPO | Admitting: Internal Medicine

## 2024-01-25 ENCOUNTER — Ambulatory Visit: Payer: Medicare PPO | Admitting: Internal Medicine

## 2024-01-25 VITALS — BP 120/70 | HR 89 | Temp 98.2°F | Resp 16 | Ht 60.0 in | Wt 118.6 lb

## 2024-01-25 DIAGNOSIS — E279 Disorder of adrenal gland, unspecified: Secondary | ICD-10-CM | POA: Diagnosis not present

## 2024-01-25 DIAGNOSIS — J449 Chronic obstructive pulmonary disease, unspecified: Secondary | ICD-10-CM | POA: Diagnosis not present

## 2024-01-25 DIAGNOSIS — E785 Hyperlipidemia, unspecified: Secondary | ICD-10-CM

## 2024-01-25 DIAGNOSIS — Z860100 Personal history of colon polyps, unspecified: Secondary | ICD-10-CM

## 2024-01-25 DIAGNOSIS — I700 Atherosclerosis of aorta: Secondary | ICD-10-CM

## 2024-01-25 DIAGNOSIS — F439 Reaction to severe stress, unspecified: Secondary | ICD-10-CM | POA: Diagnosis not present

## 2024-01-25 DIAGNOSIS — R739 Hyperglycemia, unspecified: Secondary | ICD-10-CM

## 2024-01-25 DIAGNOSIS — I739 Peripheral vascular disease, unspecified: Secondary | ICD-10-CM | POA: Diagnosis not present

## 2024-01-25 MED ORDER — CLOPIDOGREL BISULFATE 75 MG PO TABS: 75.0000 mg | ORAL_TABLET | Freq: Every day | ORAL | 1 refills | Status: DC

## 2024-01-25 MED ORDER — ROSUVASTATIN CALCIUM 10 MG PO TABS: 10.0000 mg | ORAL_TABLET | Freq: Every day | ORAL | 2 refills | Status: DC

## 2024-01-25 MED ORDER — SERTRALINE HCL 50 MG PO TABS: 50.0000 mg | ORAL_TABLET | Freq: Every day | ORAL | 1 refills | Status: DC

## 2024-01-25 NOTE — Progress Notes (Signed)
 Subjective:    Patient ID: Toni Gardner, female    DOB: [birth year], 73 y.o.   MRN: [id number]  Patient here for  Chief Complaint  Patient presents with   Medical Management of Chronic Issues    HPI Here for a scheduled follow up. Had f/u with Dr Tim Lair 12/20/23. Continue trelegy. Breathing stable. Continues on zoloft. Stable on this medication. Tries to stay active.    Past Medical History:  Diagnosis Date   Colon polyp    Dermatitis    PONV (postoperative nausea and vomiting)    Urine incontinence    Past Surgical History:  Procedure Laterality Date   ABDOMINAL HYSTERECTOMY     ADENOIDECTOMY     COLONOSCOPY  06/28/2008   Dr Lemar Livings   COLONOSCOPY WITH PROPOFOL N/A 07/29/2022   Procedure: COLONOSCOPY WITH PROPOFOL;  Surgeon: Earline Mayotte, MD;  Location: [facility] ENDOSCOPY;  Service: Endoscopy;  Laterality: N/A;   LOWER EXTREMITY ANGIOGRAPHY Right 05/09/2020   Procedure: LOWER EXTREMITY ANGIOGRAPHY;  Surgeon: Annice Needy, MD;  Location: [facility] INVASIVE CV LAB;  Service: Cardiovascular;  Laterality: Right;   Family History  Problem Relation Age of Onset   Heart disease Father    Colon polyps Sister    Breast cancer Maternal Aunt 35 - 79   Cancer Maternal Uncle        colon   Social History   Socioeconomic History   Marital status: Married    Spouse name: Not on file   Number of children: Not on file   Years of education: Not on file   Highest education level: Bachelor's degree (e.g., BA, AB, BS)  Occupational History   Not on file  Tobacco Use   Smoking status: Former    Current packs/day: 0.00    Types: Cigarettes    Start date: 04/11/2000    Quit date: 04/11/2020    Years since quitting: 3.8   Smokeless tobacco: Never  Vaping Use   Vaping status: Never Used  Substance and Sexual Activity   Alcohol use: Yes   Drug use: No   Sexual activity: Not on file  Other Topics Concern   Not on file  Social History Narrative   Married   Works part time   Social  Drivers of Corporate investment banker Strain: Low Risk  (01/25/2024)   Overall Financial Resource Strain (CARDIA)    Difficulty of Paying Living Expenses: Not hard at all  Food Insecurity: No Food Insecurity (01/25/2024)   Hunger Vital Sign    Worried About Running Out of Food in the Last Year: Never true    Ran Out of Food in the Last Year: Never true  Transportation Needs: No Transportation Needs (01/25/2024)   PRAPARE - Administrator, Civil Service (Medical): No    Lack of Transportation (Non-Medical): No  Physical Activity: Insufficiently Active (01/25/2024)   Exercise Vital Sign    Days of Exercise per Week: 4 days    Minutes of Exercise per Session: 30 min  Stress: No Stress Concern Present (01/25/2024)   [employer] of Occupational Health - Occupational Stress Questionnaire    Feeling of Stress : Not at all  Social Connections: Socially Integrated (01/25/2024)   Social Connection and Isolation Panel [NHANES]    Frequency of Communication with Friends and Family: More than three times a week    Frequency of Social Gatherings with Friends and Family: Three times a week    Attends Religious Services: More  than 4 times per year    Active Member of Clubs or Organizations: Yes    Attends Banker Meetings: More than 4 times per year    Marital Status: Married     Review of Systems  Constitutional:  Negative for appetite change and unexpected weight change.  HENT:  Negative for congestion and sinus pressure.   Respiratory:  Negative for cough, chest tightness and shortness of breath.   Cardiovascular:  Negative for chest pain and palpitations.  Gastrointestinal:  Negative for abdominal pain, diarrhea, nausea and vomiting.  Genitourinary:  Negative for difficulty urinating and dysuria.  Musculoskeletal:  Negative for joint swelling and myalgias.  Skin:  Negative for color change and rash.  Neurological:  Negative for dizziness and headaches.   Psychiatric/Behavioral:  Negative for agitation and dysphoric mood.        Objective:     BP 120/70   Pulse 89   Temp 98.2 F (36.8 C)   Resp 16   Ht 5' (1.524 m)   Wt 118 lb 9.6 oz (53.8 kg)   SpO2 98%   BMI 23.16 kg/m  Wt Readings from Last 3 Encounters:  01/25/24 118 lb 9.6 oz (53.8 kg)  12/07/23 112 lb (50.8 kg)  09/23/23 115 lb (52.2 kg)    Physical Exam Vitals reviewed.  Constitutional:      General: She is not in acute distress.    Appearance: Normal appearance.  HENT:     Head: Normocephalic and atraumatic.     Right Ear: External ear normal.     Left Ear: External ear normal.     Mouth/Throat:     Pharynx: No oropharyngeal exudate or posterior oropharyngeal erythema.  Eyes:     General: No scleral icterus.       Right eye: No discharge.        Left eye: No discharge.     Conjunctiva/sclera: Conjunctivae normal.  Neck:     Thyroid: No thyromegaly.  Cardiovascular:     Rate and Rhythm: Normal rate and regular rhythm.  Pulmonary:     Effort: No respiratory distress.     Breath sounds: Normal breath sounds. No wheezing.  Abdominal:     General: Bowel sounds are normal.     Palpations: Abdomen is soft.     Tenderness: There is no abdominal tenderness.  Musculoskeletal:        General: No swelling or tenderness.     Cervical back: Neck supple. No tenderness.  Lymphadenopathy:     Cervical: No cervical adenopathy.  Skin:    Findings: No erythema or rash.  Neurological:     Mental Status: She is alert.  Psychiatric:        Mood and Affect: Mood normal.        Behavior: Behavior normal.         Outpatient Encounter Medications as of 01/25/2024  Medication Sig   Fluticasone-Umeclidin-Vilant (TRELEGY ELLIPTA) 100-62.5-25 MCG/ACT AEPB INHALE 1 PUFF INTO THE LUNGS ONCE DAILY   aspirin EC 81 MG tablet Take 1 tablet (81 mg total) by mouth daily.   ciclopirox (LOPROX) 0.77 % cream APPLY TOPICALLY 2 (TWO) TIMES DAILY. APPLY TWICE DAILY TO FEET AND IN  BETWEEN TOES UNTIL CLEAR THEN ONE MORE WEEK AND THEN ONCE WEEKLY FOR MAINTENANCE.   clopidogrel (PLAVIX) 75 MG tablet Take 1 tablet (75 mg total) by mouth daily.   rosuvastatin (CRESTOR) 10 MG tablet Take 1 tablet (10 mg total) by mouth daily.  sertraline (ZOLOFT) 50 MG tablet Take 1 tablet (50 mg total) by mouth daily.   [DISCONTINUED] clopidogrel (PLAVIX) 75 MG tablet TAKE 1 TABLET BY MOUTH EVERY DAY   [DISCONTINUED] rosuvastatin (CRESTOR) 10 MG tablet TAKE 1 TABLET BY MOUTH EVERY DAY   [DISCONTINUED] sertraline (ZOLOFT) 50 MG tablet TAKE 1 TABLET BY MOUTH EVERY DAY   [DISCONTINUED] TRELEGY ELLIPTA 100-62.5-25 MCG/ACT AEPB Inhale into the lungs.   No facility-administered encounter medications on file as of 01/25/2024.     Lab Results  Component Value Date   WBC 6.3 02/05/2023   HGB 13.1 02/05/2023   HCT 39.1 02/05/2023   PLT 202.0 02/05/2023   GLUCOSE 93 10/29/2023   CHOL 174 09/23/2023   TRIG 80.0 09/23/2023   HDL 114.70 09/23/2023   LDLCALC 43 09/23/2023   ALT 23 09/23/2023   AST 21 09/23/2023   NA 138 09/23/2023   K 3.6 09/23/2023   CL 103 09/23/2023   CREATININE 0.83 09/23/2023   BUN 16 09/23/2023   CO2 25 09/23/2023   TSH 1.23 02/05/2023   HGBA1C 5.9 10/29/2023    MM 3D SCREENING MAMMOGRAM BILATERAL BREAST Result Date: 07/28/2023 CLINICAL DATA:  Screening. EXAM: DIGITAL SCREENING BILATERAL MAMMOGRAM WITH TOMOSYNTHESIS AND CAD TECHNIQUE: Bilateral screening digital craniocaudal and mediolateral oblique mammograms were obtained. Bilateral screening digital breast tomosynthesis was performed. The images were evaluated with computer-aided detection. COMPARISON:  Previous exam(s). ACR Breast Density Category c: The breasts are heterogeneously dense, which may obscure small masses. FINDINGS: There are no findings suspicious for malignancy. IMPRESSION: No mammographic evidence of malignancy. A result letter of this screening mammogram will be mailed directly to the patient.  RECOMMENDATION: Screening mammogram in one year. (Code:SM-B-01Y) BI-RADS CATEGORY  1: Negative. Electronically Signed   By: Meda Klinefelter M.D.   On: 07/28/2023 12:15       Assessment & Plan:  Stress Assessment & Plan: Continues on zoloft. Overall stable. Follow.    Hyperglycemia -     Hemoglobin A1c; Future  Hyperlipidemia, unspecified hyperlipidemia type Assessment & Plan: Continue on crestor. Low cholesterol diet and exercise. Follow lipid panel and liver function tests.   Orders: -     Lipid panel; Future -     Hepatic function panel; Future -     Basic metabolic panel; Future -     TSH; Future -     CBC with Differential/Platelet; Future  PAD (peripheral artery disease) (HCC) Assessment & Plan: Evaluated by [facility] - 04/16/23 - ABI - Rt=1.15 and Lt=1.09 (previous ABI's Rt=1.08 and Lt=0.90) Duplex ultrasound of the right lower extremity shows triphasic waveforms with biphasic on the left and normal toe waveforms bilaterally. Continue f/u with [facility].    History of colonic polyps Assessment & Plan: Colonoscopy 07/2022 - recommended f/u in 3 years.    Chronic obstructive pulmonary disease, unspecified COPD type [facility]) Assessment & Plan: Saw Dr Tim Lair 12/20/23. CT 04/26/23 - No acute findings to explain the patient's shortness of breath. 4 mm posterior segment right upper lobe nodule. Left adrenal adenoma. Aortic atherosclerosis Coronary artery calcification. Emphysema. Continues on trelegy. Breathing stable.     Aortic atherosclerosis (HCC) Assessment & Plan: Continue crestor.    Adrenal nodule [facility]) Assessment & Plan: Found incidentally on CT. Being followed by endocrinology. Cortisol suppressed.    Other orders -     Clopidogrel Bisulfate; Take 1 tablet (75 mg total) by mouth daily.  Dispense: 90 tablet; Refill: 1 -     Rosuvastatin Calcium; Take 1 tablet (10  mg total) by mouth daily.  Dispense: 90 tablet; Refill: 2 -     Sertraline HCl; Take 1 tablet (50 mg  total) by mouth daily.  Dispense: 90 tablet; Refill: 1     Dale [place], MD

## 2024-01-27 ENCOUNTER — Telehealth: Payer: Self-pay

## 2024-01-27 MED ORDER — NYSTATIN 100000 UNIT/ML MT SUSP: 5.0000 mL | Freq: Three times a day (TID) | OROMUCOSAL | 0 refills | Status: DC | PRN

## 2024-01-27 NOTE — Addendum Note (Signed)
 Addended by: Charm Barges on: 01/27/2024 04:55 PM   Modules accepted: Orders

## 2024-01-27 NOTE — Telephone Encounter (Signed)
 Copied from CRM [id number]. Topic: Clinical - Medical Advice >> Jan 27, 2024  3:32 PM Toni Gardner wrote: Reason for CRM:   pt called to advise she has thush in her mouth, would like to know if the provider would send in a prescription for medicine that will take care of the problem please give pt a call back at [phone]

## 2024-01-27 NOTE — Telephone Encounter (Signed)
Rx sent in for nystatin suspension.  

## 2024-01-27 NOTE — Telephone Encounter (Signed)
 Patient thinks that her inhaler (Trellegy) has caused her to have thrush. She has a film in her mouth and mouth is burning. Denies any other symptoms. She had mild symptoms of this when she saw you but has gotten worse. She would like something called in for thrush.   [facility].

## 2024-01-27 NOTE — Telephone Encounter (Signed)
 Confirmed allergies. [facility]

## 2024-01-27 NOTE — Telephone Encounter (Signed)
 Confirm only allergy is sulfa. If so, I will send in nystatin suspension - swish and spit tid.

## 2024-01-29 ENCOUNTER — Encounter: Payer: Self-pay | Admitting: Internal Medicine

## 2024-01-29 NOTE — Assessment & Plan Note (Signed)
 Continues on zoloft. Overall stable. Follow.

## 2024-01-29 NOTE — Assessment & Plan Note (Signed)
 Continue on crestor.  Low cholesterol diet and exercise.  Follow lipid panel and liver function tests.

## 2024-01-29 NOTE — Assessment & Plan Note (Signed)
 Evaluated by [facility] - 04/16/23 - ABI - Rt=1.15 and Lt=1.09 (previous ABI's Rt=1.08 and Lt=0.90) Duplex ultrasound of the right lower extremity shows triphasic waveforms with biphasic on the left and normal toe waveforms bilaterally. Continue f/u with [facility].

## 2024-01-29 NOTE — Assessment & Plan Note (Signed)
 Saw Dr Tim Lair 12/20/23. CT 04/26/23 - No acute findings to explain the patient's shortness of breath. 4 mm posterior segment right upper lobe nodule. Left adrenal adenoma. Aortic atherosclerosis Coronary artery calcification. Emphysema. Continues on trelegy. Breathing stable.

## 2024-01-29 NOTE — Assessment & Plan Note (Signed)
 Colonoscopy 07/2022 - recommended f/u in 3 years.

## 2024-01-29 NOTE — Assessment & Plan Note (Signed)
 Found incidentally on CT. Being followed by endocrinology. Cortisol suppressed.

## 2024-01-29 NOTE — Assessment & Plan Note (Signed)
 Continue crestor

## 2024-02-11 ENCOUNTER — Other Ambulatory Visit: Payer: Medicare PPO

## 2024-02-18 ENCOUNTER — Other Ambulatory Visit: Payer: Medicare PPO

## 2024-02-18 DIAGNOSIS — E785 Hyperlipidemia, unspecified: Secondary | ICD-10-CM | POA: Diagnosis not present

## 2024-02-18 DIAGNOSIS — R739 Hyperglycemia, unspecified: Secondary | ICD-10-CM | POA: Diagnosis not present

## 2024-02-18 LAB — HEPATIC FUNCTION PANEL
ALT: 25 U/L (ref 0–35)
AST: 23 U/L (ref 0–37)
Albumin: 4.2 g/dL (ref 3.5–5.2)
Alkaline Phosphatase: 82 U/L (ref 39–117)
Bilirubin, Direct: 0.1 mg/dL (ref 0.0–0.3)
Total Bilirubin: 0.4 mg/dL (ref 0.2–1.2)
Total Protein: 7.2 g/dL (ref 6.0–8.3)

## 2024-02-18 LAB — CBC WITH DIFFERENTIAL/PLATELET
Basophils Absolute: 0 10*3/uL (ref 0.0–0.1)
Basophils Relative: 0.2 % (ref 0.0–3.0)
Eosinophils Absolute: 0.2 10*3/uL (ref 0.0–0.7)
Eosinophils Relative: 2.3 % (ref 0.0–5.0)
HCT: 38.1 % (ref 36.0–46.0)
Hemoglobin: 12.9 g/dL (ref 12.0–15.0)
Lymphocytes Relative: 16.2 % (ref 12.0–46.0)
Lymphs Abs: 1.3 10*3/uL (ref 0.7–4.0)
MCHC: 33.7 g/dL (ref 30.0–36.0)
MCV: 90.7 fl (ref 78.0–100.0)
Monocytes Absolute: 0.7 10*3/uL (ref 0.1–1.0)
Monocytes Relative: 9.3 % (ref 3.0–12.0)
Neutro Abs: 5.7 10*3/uL (ref 1.4–7.7)
Neutrophils Relative %: 72 % (ref 43.0–77.0)
Platelets: 249 10*3/uL (ref 150.0–400.0)
RBC: 4.2 Mil/uL (ref 3.87–5.11)
RDW: 14 % (ref 11.5–15.5)
WBC: 7.9 10*3/uL (ref 4.0–10.5)

## 2024-02-18 LAB — LIPID PANEL
Cholesterol: 169 mg/dL (ref 0–200)
HDL: 109.9 mg/dL (ref >39.00)
LDL Cholesterol: 47 mg/dL (ref 0–99)
NonHDL: 58.72
Total CHOL/HDL Ratio: 2
Triglycerides: 58 mg/dL (ref 0.0–149.0)
VLDL: 11.6 mg/dL (ref 0.0–40.0)

## 2024-02-18 LAB — BASIC METABOLIC PANEL
BUN: 15 mg/dL (ref 6–23)
CO2: 24 meq/L (ref 19–32)
Calcium: 9.2 mg/dL (ref 8.4–10.5)
Chloride: 106 meq/L (ref 96–112)
Creatinine, Ser: 0.82 mg/dL (ref 0.40–1.20)
GFR: 70.43 mL/min (ref >60.00)
Glucose, Bld: 96 mg/dL (ref 70–99)
Potassium: 4.4 meq/L (ref 3.5–5.1)
Sodium: 139 meq/L (ref 135–145)

## 2024-02-18 LAB — HEMOGLOBIN A1C: Hgb A1c MFr Bld: 5.9 % (ref 4.6–6.5)

## 2024-02-18 LAB — TSH: TSH: 1.04 u[IU]/mL (ref 0.35–5.50)

## 2024-02-22 ENCOUNTER — Encounter: Payer: Self-pay | Admitting: Internal Medicine

## 2024-02-23 DIAGNOSIS — H6123 Impacted cerumen, bilateral: Secondary | ICD-10-CM | POA: Diagnosis not present

## 2024-02-23 DIAGNOSIS — H903 Sensorineural hearing loss, bilateral: Secondary | ICD-10-CM | POA: Diagnosis not present

## 2024-04-06 ENCOUNTER — Other Ambulatory Visit: Payer: Self-pay | Admitting: Pulmonary Disease

## 2024-04-06 ENCOUNTER — Telehealth: Admitting: Internal Medicine

## 2024-04-06 ENCOUNTER — Encounter: Payer: Self-pay | Admitting: Internal Medicine

## 2024-04-06 DIAGNOSIS — J449 Chronic obstructive pulmonary disease, unspecified: Secondary | ICD-10-CM | POA: Diagnosis not present

## 2024-04-06 DIAGNOSIS — Z87891 Personal history of nicotine dependence: Secondary | ICD-10-CM

## 2024-04-06 DIAGNOSIS — J329 Chronic sinusitis, unspecified: Secondary | ICD-10-CM | POA: Diagnosis not present

## 2024-04-06 DIAGNOSIS — R911 Solitary pulmonary nodule: Secondary | ICD-10-CM

## 2024-04-06 MED ORDER — AMOXICILLIN-POT CLAVULANATE 875-125 MG PO TABS: 1.0000 | ORAL_TABLET | Freq: Two times a day (BID) | ORAL | 0 refills | Status: DC

## 2024-04-06 NOTE — Progress Notes (Unsigned)
 Patient ID: Toni Gardner, female   DOB: [birth year], 73 y.o.   MRN: [id number]   Virtual Visit via video Note  I connected with Fatemeh Deveraux by a video enabled telemedicine application and verified that I am speaking with the correct person using two identifiers. Location patient: home Location provider: work Persons participating in the virtual visit: patient, provider  The limitations, risks, security and privacy concerns of performing an evaluation and management service by video and the availability of in person appointments have been discussed. It has also been discussed with the patient that there may be a patient responsible charge related to this service. The patient expressed understanding and agreed to proceed.   Reason for visit: work in appt  HPI: Work in with concerns regarding increased head congestion and sinus pressure. Reports symptoms started Easter Sunday - cold symptoms. Reports persistent increased head congestion and sinus pressure. Nasal congestion. Yellow/green mucus production. No fever. No chest congestion. No sob or chest tightness. No vomiting or diarrhea. Using neti pot. Taking tylenol . Afrin nasal spray.    ROS: See pertinent positives and negatives per HPI.  Past Medical History:  Diagnosis Date   Colon polyp    Dermatitis    PONV (postoperative nausea and vomiting)    Urine incontinence     Past Surgical History:  Procedure Laterality Date   ABDOMINAL HYSTERECTOMY     ADENOIDECTOMY     COLONOSCOPY  06/28/2008   Dr Marquita Situ   COLONOSCOPY WITH PROPOFOL  N/A 07/29/2022   Procedure: COLONOSCOPY WITH PROPOFOL ;  Surgeon: Marshall Skeeter, MD;  Location: [facility] ENDOSCOPY;  Service: Endoscopy;  Laterality: N/A;   LOWER EXTREMITY ANGIOGRAPHY Right 05/09/2020   Procedure: LOWER EXTREMITY ANGIOGRAPHY;  Surgeon: Celso College, MD;  Location: [facility] INVASIVE CV LAB;  Service: Cardiovascular;  Laterality: Right;    Family History  Problem Relation Age of Onset   Heart  disease Father    Colon polyps Sister    Breast cancer Maternal Aunt 9 - 79   Cancer Maternal Uncle        colon    SOCIAL HX: reviewed.    Current Outpatient Medications:    amoxicillin -clavulanate (AUGMENTIN ) 875-125 MG tablet, Take 1 tablet by mouth 2 (two) times daily., Disp: 14 tablet, Rfl: 0   aspirin  EC 81 MG tablet, Take 1 tablet (81 mg total) by mouth daily., Disp: 150 tablet, Rfl: 2   ciclopirox  (LOPROX ) 0.77 % cream, APPLY TOPICALLY 2 (TWO) TIMES DAILY. APPLY TWICE DAILY TO FEET AND IN BETWEEN TOES UNTIL CLEAR THEN ONE MORE WEEK AND THEN ONCE WEEKLY FOR MAINTENANCE., Disp: 90 g, Rfl: 3   clopidogrel  (PLAVIX ) 75 MG tablet, Take 1 tablet (75 mg total) by mouth daily., Disp: 90 tablet, Rfl: 1   Fluticasone -Umeclidin-Vilant (TRELEGY ELLIPTA) 100-62.5-25 MCG/ACT AEPB, INHALE 1 PUFF INTO THE LUNGS ONCE DAILY, Disp: , Rfl:    nystatin  (MYCOSTATIN ) 100000 UNIT/ML suspension, Take 5 mLs (500,000 Units total) by mouth 3 (three) times daily as needed., Disp: 60 mL, Rfl: 0   rosuvastatin  (CRESTOR ) 10 MG tablet, Take 1 tablet (10 mg total) by mouth daily., Disp: 90 tablet, Rfl: 2   sertraline  (ZOLOFT ) 50 MG tablet, Take 1 tablet (50 mg total) by mouth daily., Disp: 90 tablet, Rfl: 1  EXAM:  GENERAL: alert, oriented, appears well and in no acute distress  HEENT: atraumatic, conjunttiva clear, no obvious abnormalities on inspection of external nose and ears  NECK: normal movements of the head and neck  LUNGS: on  inspection no signs of respiratory distress, breathing rate appears normal, no obvious gross SOB, gasping or wheezing  CV: no obvious cyanosis  PSYCH/NEURO: pleasant and cooperative, no obvious depression or anxiety, speech and thought processing grossly intact  ASSESSMENT AND PLAN:  Discussed the following assessment and plan:  Problem List Items Addressed This Visit     COPD (chronic obstructive pulmonary disease) (HCC)   Breathing stable. No sob. No chest congestion  or cough.       Sinusitis - Primary   Persistent increased nasal/head congestion and sinus pressure. Using neti pot and afrin. Given persistent symptoms and worsening symptoms over the last two weeks, will treat with augmentin  875mg  bid. Saline nasal spray and nasacort nasal spray as directed.  Robitussin/mucinex as directed. Follow. Call with update.       Relevant Medications   amoxicillin -clavulanate (AUGMENTIN ) 875-125 MG tablet    Return if symptoms worsen or fail to improve.   I discussed the assessment and treatment plan with the patient. The patient was provided an opportunity to ask questions and all were answered. The patient agreed with the plan and demonstrated an understanding of the instructions.   The patient was advised to call back or seek an in-person evaluation if the symptoms worsen or if the condition fails to improve as anticipated.    Dellar Fenton, MD

## 2024-04-06 NOTE — Telephone Encounter (Signed)
 Pt scheduled with Dr Lorin Picket

## 2024-04-09 ENCOUNTER — Encounter: Payer: Self-pay | Admitting: Internal Medicine

## 2024-04-09 NOTE — Assessment & Plan Note (Signed)
 Breathing stable. No sob. No chest congestion or cough.

## 2024-04-09 NOTE — Assessment & Plan Note (Signed)
 Persistent increased nasal/head congestion and sinus pressure. Using neti pot and afrin. Given persistent symptoms and worsening symptoms over the last two weeks, will treat with augmentin  875mg  bid. Saline nasal spray and nasacort nasal spray as directed.  Robitussin/mucinex as directed. Follow. Call with update.

## 2024-04-14 ENCOUNTER — Ambulatory Visit
Admission: RE | Admit: 2024-04-14 | Discharge: 2024-04-14 | Disposition: A | Discharge status: Home / Self Care | Source: Ambulatory Visit | Attending: Pulmonary Disease | Admitting: Pulmonary Disease

## 2024-04-14 DIAGNOSIS — R911 Solitary pulmonary nodule: Secondary | ICD-10-CM | POA: Diagnosis not present

## 2024-04-14 DIAGNOSIS — Z87891 Personal history of nicotine dependence: Secondary | ICD-10-CM | POA: Insufficient documentation

## 2024-04-18 DIAGNOSIS — R0609 Other forms of dyspnea: Secondary | ICD-10-CM | POA: Diagnosis not present

## 2024-04-24 ENCOUNTER — Other Ambulatory Visit (INDEPENDENT_AMBULATORY_CARE_PROVIDER_SITE_OTHER): Payer: Self-pay | Admitting: Nurse Practitioner

## 2024-04-24 DIAGNOSIS — I739 Peripheral vascular disease, unspecified: Secondary | ICD-10-CM

## 2024-04-25 ENCOUNTER — Encounter (INDEPENDENT_AMBULATORY_CARE_PROVIDER_SITE_OTHER): Payer: Self-pay

## 2024-04-25 ENCOUNTER — Ambulatory Visit (INDEPENDENT_AMBULATORY_CARE_PROVIDER_SITE_OTHER): Payer: Medicare PPO | Admitting: Vascular Surgery

## 2024-04-25 ENCOUNTER — Ambulatory Visit (INDEPENDENT_AMBULATORY_CARE_PROVIDER_SITE_OTHER): Payer: Medicare PPO

## 2024-04-25 VITALS — BP 109/72 | HR 85 | Resp 17 | Ht <= 58 in | Wt 116.6 lb

## 2024-04-25 DIAGNOSIS — Z9889 Other specified postprocedural states: Secondary | ICD-10-CM

## 2024-04-25 DIAGNOSIS — I70211 Atherosclerosis of native arteries of extremities with intermittent claudication, right leg: Secondary | ICD-10-CM

## 2024-04-25 DIAGNOSIS — I739 Peripheral vascular disease, unspecified: Secondary | ICD-10-CM

## 2024-04-25 DIAGNOSIS — E785 Hyperlipidemia, unspecified: Secondary | ICD-10-CM | POA: Diagnosis not present

## 2024-04-25 NOTE — Assessment & Plan Note (Signed)
 lipid control important in reducing the progression of atherosclerotic disease. Continue statin therapy

## 2024-04-25 NOTE — Assessment & Plan Note (Signed)
 Her ABIs today are 1.06 bilaterally with triphasic waveforms good digital pressures and waveforms bilaterally.  No current worrisome symptoms.  Tolerating aspirin , Plavix , and Crestor  well.  Continue to follow on an annual basis.

## 2024-04-25 NOTE — Progress Notes (Signed)
 MRN : [id number]  Toni Gardner is a 73 y.o. (26-Feb-1951) female who presents with chief complaint of  Chief Complaint  Patient presents with   Venous Insufficiency  .  History of Present Illness: Patient returns today in follow up of her PAD.  She underwent right external iliac artery stent placement about 4 years ago.  She is doing well.  She denies any current lifestyle limiting claudication, ischemic rest pain, or ulceration.  Her ABIs today are 1.06 bilaterally with triphasic waveforms good digital pressures and waveforms bilaterally.  Current Outpatient Medications  Medication Sig Dispense Refill   aspirin  EC 81 MG tablet Take 1 tablet (81 mg total) by mouth daily. 150 tablet 2   ciclopirox  (LOPROX ) 0.77 % cream APPLY TOPICALLY 2 (TWO) TIMES DAILY. APPLY TWICE DAILY TO FEET AND IN BETWEEN TOES UNTIL CLEAR THEN ONE MORE WEEK AND THEN ONCE WEEKLY FOR MAINTENANCE. 90 g 3   clopidogrel  (PLAVIX ) 75 MG tablet Take 1 tablet (75 mg total) by mouth daily. 90 tablet 1   Fluticasone -Umeclidin-Vilant (TRELEGY ELLIPTA) 100-62.5-25 MCG/ACT AEPB INHALE 1 PUFF INTO THE LUNGS ONCE DAILY     nystatin  (MYCOSTATIN ) 100000 UNIT/ML suspension Take 5 mLs (500,000 Units total) by mouth 3 (three) times daily as needed. 60 mL 0   rosuvastatin  (CRESTOR ) 10 MG tablet Take 1 tablet (10 mg total) by mouth daily. 90 tablet 2   sertraline  (ZOLOFT ) 50 MG tablet Take 1 tablet (50 mg total) by mouth daily. 90 tablet 1   amoxicillin -clavulanate (AUGMENTIN ) 875-125 MG tablet Take 1 tablet by mouth 2 (two) times daily. (Patient not taking: Reported on 04/25/2024) 14 tablet 0   No current facility-administered medications for this visit.    Past Medical History:  Diagnosis Date   Colon polyp    Dermatitis    PONV (postoperative nausea and vomiting)    Urine incontinence     Past Surgical History:  Procedure Laterality Date   ABDOMINAL HYSTERECTOMY     ADENOIDECTOMY     COLONOSCOPY  06/28/2008   Dr Marquita Situ    COLONOSCOPY WITH PROPOFOL  N/A 07/29/2022   Procedure: COLONOSCOPY WITH PROPOFOL ;  Surgeon: Marshall Skeeter, MD;  Location: [facility] ENDOSCOPY;  Service: Endoscopy;  Laterality: N/A;   LOWER EXTREMITY ANGIOGRAPHY Right 05/09/2020   Procedure: LOWER EXTREMITY ANGIOGRAPHY;  Surgeon: Celso College, MD;  Location: [facility] INVASIVE CV LAB;  Service: Cardiovascular;  Laterality: Right;     Social History   Tobacco Use   Smoking status: Former    Current packs/day: 0.00    Types: Cigarettes    Start date: 04/11/2000    Quit date: 04/11/2020    Years since quitting: 4.0   Smokeless tobacco: Never  Vaping Use   Vaping status: Never Used  Substance Use Topics   Alcohol use: Yes   Drug use: No       Family History  Problem Relation Age of Onset   Heart disease Father    Colon polyps Sister    Breast cancer Maternal Aunt 67 - 79   Cancer Maternal Uncle        colon     Allergies  Allergen Reactions   Sulfa Antibiotics Swelling     REVIEW OF SYSTEMS (Negative unless checked)  Constitutional: [] Weight loss  [] Fever  [] Chills Cardiac: [] Chest pain   [] Chest pressure   [] Palpitations   [] Shortness of breath when laying flat   [] Shortness of breath at rest   [] Shortness of breath with exertion. Vascular:  []   Pain in legs with walking   [] Pain in legs at rest   [] Pain in legs when laying flat   [] Claudication   [] Pain in feet when walking  [] Pain in feet at rest  [] Pain in feet when laying flat   [] History of DVT   [] Phlebitis   [] Swelling in legs   [] Varicose veins   [] Non-healing ulcers Pulmonary:   [] Uses home oxygen   [] Productive cough   [] Hemoptysis   [] Wheeze  [] COPD   [] Asthma Neurologic:  [] Dizziness  [] Blackouts   [] Seizures   [] History of stroke   [] History of TIA  [] Aphasia   [] Temporary blindness   [] Dysphagia   [] Weakness or numbness in arms   [] Weakness or numbness in legs Musculoskeletal:  [x] Arthritis   [] Joint swelling   [] Joint pain   [] Low back pain Hematologic:  [] Easy bruising   [] Easy bleeding   [] Hypercoagulable state   [] Anemic   Gastrointestinal:  [] Blood in stool   [] Vomiting blood  [] Gastroesophageal reflux/heartburn   [x] Abdominal pain Genitourinary:  [] Chronic kidney disease   [x] Difficult urination  [] Frequent urination  [] Burning with urination   [] Hematuria Skin:  [] Rashes   [] Ulcers   [] Wounds Psychological:  [] History of anxiety   []  History of major depression.  Physical Examination  BP 109/72 (BP Location: Left Arm, Patient Position: Sitting, Cuff Size: Normal)   Pulse 85   Resp 17   Ht 4\' 9"  (1.448 m)   Wt 116 lb 9.6 oz (52.9 kg)   BMI 25.23 kg/m  Gen:  WD/WN, NAD. Appears younger than stated age. Head: [place]/AT, No temporalis wasting. Ear/Nose/Throat: Hearing grossly intact, nares w/o erythema or drainage Eyes: Conjunctiva clear. Sclera non-icteric Neck: Supple.  Trachea midline Pulmonary:  Good air movement, no use of accessory muscles.  Cardiac: RRR, no JVD Vascular:  Vessel Right Left  Radial Palpable Palpable                          PT Palpable Palpable  DP Palpable Palpable   Gastrointestinal: soft, non-tender/non-distended. No guarding/reflex.  Musculoskeletal: M/S 5/5 throughout.  No deformity or atrophy. No edema. Neurologic: Sensation grossly intact in extremities.  Symmetrical.  Speech is fluent.  Psychiatric: Judgment intact, Mood & affect appropriate for pt's clinical situation. Dermatologic: No rashes or ulcers noted.  No cellulitis or open wounds.      Labs Recent Results (from the past 2160 hours)  CBC with Differential/Platelet     Status: None   Collection Time: 02/18/24  9:49 AM  Result Value Ref Range   WBC 7.9 4.0 - 10.5 K/uL   RBC 4.20 3.87 - 5.11 Mil/uL   Hemoglobin 12.9 12.0 - 15.0 g/dL   HCT 16.1 09.6 - 04.5 %   MCV 90.7 78.0 - 100.0 fl   MCHC 33.7 30.0 - 36.0 g/dL   RDW 40.9 81.1 - 91.4 %   Platelets 249.0 150.0 - 400.0 K/uL   Neutrophils Relative % 72.0 43.0 - 77.0 %   Lymphocytes Relative  16.2 12.0 - 46.0 %   Monocytes Relative 9.3 3.0 - 12.0 %   Eosinophils Relative 2.3 0.0 - 5.0 %   Basophils Relative 0.2 0.0 - 3.0 %   Neutro Abs 5.7 1.4 - 7.7 K/uL   Lymphs Abs 1.3 0.7 - 4.0 K/uL   Monocytes Absolute 0.7 0.1 - 1.0 K/uL   Eosinophils Absolute 0.2 0.0 - 0.7 K/uL   Basophils Absolute 0.0 0.0 - 0.1 K/uL  TSH  Status: None   Collection Time: 02/18/24  9:49 AM  Result Value Ref Range   TSH 1.04 0.35 - 5.50 uIU/mL  Basic metabolic panel     Status: None   Collection Time: 02/18/24  9:49 AM  Result Value Ref Range   Sodium 139 135 - 145 mEq/L   Potassium 4.4 3.5 - 5.1 mEq/L   Chloride 106 96 - 112 mEq/L   CO2 24 19 - 32 mEq/L   Glucose, Bld 96 70 - 99 mg/dL   BUN 15 6 - 23 mg/dL   Creatinine, Ser 4.69 0.40 - 1.20 mg/dL   GFR 62.95 >28.41 mL/min    Comment: Calculated using the CKD-EPI Creatinine Equation (2021)   Calcium  9.2 8.4 - 10.5 mg/dL  Hepatic function panel     Status: None   Collection Time: 02/18/24  9:49 AM  Result Value Ref Range   Total Bilirubin 0.4 0.2 - 1.2 mg/dL   Bilirubin, Direct 0.1 0.0 - 0.3 mg/dL   Alkaline Phosphatase 82 39 - 117 U/L   AST 23 0 - 37 U/L   ALT 25 0 - 35 U/L   Total Protein 7.2 6.0 - 8.3 g/dL   Albumin 4.2 3.5 - 5.2 g/dL  Lipid panel     Status: None   Collection Time: 02/18/24  9:49 AM  Result Value Ref Range   Cholesterol 169 0 - 200 mg/dL    Comment: ATP III Classification       Desirable:  < 200 mg/dL               Borderline High:  200 - 239 mg/dL          High:  > = 324 mg/dL   Triglycerides 40.1 0.0 - 149.0 mg/dL    Comment: Normal:  <027 mg/dLBorderline High:  150 - 199 mg/dL   HDL 253.66 >44.03 mg/dL   VLDL 47.4 0.0 - 25.9 mg/dL   LDL Cholesterol 47 0 - 99 mg/dL   Total CHOL/HDL Ratio 2     Comment:                Men          Women1/2 Average Risk     3.4          3.3Average Risk          5.0          4.42X Average Risk          9.6          7.13X Average Risk          15.0          11.0                        NonHDL 58.72     Comment: NOTE:  Non-HDL goal should be 30 mg/dL higher than patient's LDL goal (i.e. LDL goal of < 70 mg/dL, would have non-HDL goal of < 100 mg/dL)  Hemoglobin D6L     Status: None   Collection Time: 02/18/24  9:49 AM  Result Value Ref Range   Hgb A1c MFr Bld 5.9 4.6 - 6.5 %    Comment: Glycemic Control Guidelines for People with Diabetes:Non Diabetic:  <6%Goal of Therapy: <7%Additional Action Suggested:  >8%     Radiology No results found.  Assessment/Plan  Hyperlipidemia lipid control important in reducing the progression of atherosclerotic disease. Continue statin therapy   Atherosclerosis  of native arteries of extremity with intermittent claudication (HCC) Her ABIs today are 1.06 bilaterally with triphasic waveforms good digital pressures and waveforms bilaterally.  No current worrisome symptoms.  Tolerating aspirin , Plavix , and Crestor  well.  Continue to follow on an annual basis.    Mikki Alexander, MD  04/25/2024 3:32 PM    This note was created with Dragon medical transcription system.  Any errors from dictation are purely unintentional

## 2024-04-27 LAB — VAS US ABI WITH/WO TBI
Left ABI: 1.06
Right ABI: 1.06

## 2024-05-05 ENCOUNTER — Encounter: Payer: Self-pay | Admitting: Internal Medicine

## 2024-05-05 ENCOUNTER — Telehealth: Payer: Self-pay

## 2024-05-05 NOTE — Telephone Encounter (Signed)
 I left voicemail for patient asking her to please call us  to reschedule her 05/25/2024 visit with Dr. Dellar Fenton, as Dr. Geralyn Knee will be out of the office.  I also sent a letter to patient via MyChart.  E2C2 - when patient calls back, please assist her in rescheduling this appointment.

## 2024-05-25 ENCOUNTER — Encounter: Payer: Medicare PPO | Admitting: Internal Medicine

## 2024-06-22 DIAGNOSIS — S42032A Displaced fracture of lateral end of left clavicle, initial encounter for closed fracture: Secondary | ICD-10-CM | POA: Diagnosis not present

## 2024-06-22 DIAGNOSIS — M25512 Pain in left shoulder: Secondary | ICD-10-CM | POA: Diagnosis not present

## 2024-06-29 DIAGNOSIS — S42032A Displaced fracture of lateral end of left clavicle, initial encounter for closed fracture: Secondary | ICD-10-CM | POA: Diagnosis not present

## 2024-07-14 DIAGNOSIS — S42032D Displaced fracture of lateral end of left clavicle, subsequent encounter for fracture with routine healing: Secondary | ICD-10-CM | POA: Diagnosis not present

## 2024-07-18 ENCOUNTER — Encounter: Payer: Self-pay | Admitting: Internal Medicine

## 2024-07-18 ENCOUNTER — Ambulatory Visit: Payer: Self-pay | Admitting: Internal Medicine

## 2024-07-18 ENCOUNTER — Ambulatory Visit (INDEPENDENT_AMBULATORY_CARE_PROVIDER_SITE_OTHER)

## 2024-07-18 ENCOUNTER — Ambulatory Visit: Admitting: Internal Medicine

## 2024-07-18 VITALS — BP 120/68 | HR 105 | Temp 98.2°F | Ht <= 58 in | Wt 116.6 lb

## 2024-07-18 DIAGNOSIS — E785 Hyperlipidemia, unspecified: Secondary | ICD-10-CM

## 2024-07-18 DIAGNOSIS — R739 Hyperglycemia, unspecified: Secondary | ICD-10-CM | POA: Diagnosis not present

## 2024-07-18 DIAGNOSIS — M810 Age-related osteoporosis without current pathological fracture: Secondary | ICD-10-CM

## 2024-07-18 DIAGNOSIS — F439 Reaction to severe stress, unspecified: Secondary | ICD-10-CM

## 2024-07-18 DIAGNOSIS — Z1231 Encounter for screening mammogram for malignant neoplasm of breast: Secondary | ICD-10-CM

## 2024-07-18 DIAGNOSIS — M25539 Pain in unspecified wrist: Secondary | ICD-10-CM | POA: Insufficient documentation

## 2024-07-18 DIAGNOSIS — J449 Chronic obstructive pulmonary disease, unspecified: Secondary | ICD-10-CM | POA: Diagnosis not present

## 2024-07-18 DIAGNOSIS — I739 Peripheral vascular disease, unspecified: Secondary | ICD-10-CM

## 2024-07-18 DIAGNOSIS — E279 Disorder of adrenal gland, unspecified: Secondary | ICD-10-CM

## 2024-07-18 DIAGNOSIS — M25531 Pain in right wrist: Secondary | ICD-10-CM

## 2024-07-18 DIAGNOSIS — R21 Rash and other nonspecific skin eruption: Secondary | ICD-10-CM

## 2024-07-18 DIAGNOSIS — S42035D Nondisplaced fracture of lateral end of left clavicle, subsequent encounter for fracture with routine healing: Secondary | ICD-10-CM

## 2024-07-18 DIAGNOSIS — S42009A Fracture of unspecified part of unspecified clavicle, initial encounter for closed fracture: Secondary | ICD-10-CM | POA: Insufficient documentation

## 2024-07-18 DIAGNOSIS — Z860100 Personal history of colon polyps, unspecified: Secondary | ICD-10-CM

## 2024-07-18 DIAGNOSIS — Z0001 Encounter for general adult medical examination with abnormal findings: Secondary | ICD-10-CM | POA: Diagnosis not present

## 2024-07-18 DIAGNOSIS — I700 Atherosclerosis of aorta: Secondary | ICD-10-CM

## 2024-07-18 DIAGNOSIS — Z0000 Encounter for general adult medical examination without abnormal findings: Secondary | ICD-10-CM

## 2024-07-18 NOTE — Assessment & Plan Note (Signed)
 Wrist pain - s/p fall. Check xray

## 2024-07-18 NOTE — Progress Notes (Signed)
 Subjective:    Patient ID: Toni Gardner, female    DOB: [birth year], 73 y.o.   MRN: [id number]  Patient here for  Chief Complaint  Patient presents with   Annual Exam    PT stated she fallen 3 weeks ago while on vacation fractured her clavicle bone    HPI Here for a physical exam. Had f/u with [facility] - 04/25/24 - ABIs ok. Continue aspirin , plavix  and crestor . F/u one year. Ha seen Dr Janelle. Continue trelegy. Breathing stable. Continues on zoloft . Feels stable. Did have recent fall. Saw ortho. Suffered distal clavicle fracture. Did not feel surgery warranted. Given exercises. Recommended f/u in 6 weeks. Also reports - pain - right wrist s/p fall. No head injury. No chest pain or sob reported. No abdominal pain or bowel change reported.    Past Medical History:  Diagnosis Date   Colon polyp    Dermatitis    PONV (postoperative nausea and vomiting)    Urine incontinence    Past Surgical History:  Procedure Laterality Date   ABDOMINAL HYSTERECTOMY     ADENOIDECTOMY     COLONOSCOPY  06/28/2008   Dr Dessa   COLONOSCOPY WITH PROPOFOL  N/A 07/29/2022   Procedure: COLONOSCOPY WITH PROPOFOL ;  Surgeon: Dessa Reyes ORN, MD;  Location: [facility] ENDOSCOPY;  Service: Endoscopy;  Laterality: N/A;   LOWER EXTREMITY ANGIOGRAPHY Right 05/09/2020   Procedure: LOWER EXTREMITY ANGIOGRAPHY;  Surgeon: Marea Selinda RAMAN, MD;  Location: [facility] INVASIVE CV LAB;  Service: Cardiovascular;  Laterality: Right;   Family History  Problem Relation Age of Onset   Heart disease Father    Colon polyps Sister    Breast cancer Maternal Aunt 46 - 79   Cancer Maternal Uncle        colon   Social History   Socioeconomic History   Marital status: Married    Spouse name: Not on file   Number of children: Not on file   Years of education: Not on file   Highest education level: Bachelor's degree (e.g., BA, AB, BS)  Occupational History   Not on file  Tobacco Use   Smoking status: Former    Current packs/day: 0.00     Types: Cigarettes    Start date: 04/11/2000    Quit date: 04/11/2020    Years since quitting: 4.2   Smokeless tobacco: Never  Vaping Use   Vaping status: Never Used  Substance and Sexual Activity   Alcohol use: Yes   Drug use: No   Sexual activity: Not on file  Other Topics Concern   Not on file  Social History Narrative   Married   Works part time   Social Drivers of Corporate investment banker Strain: Low Risk  (07/17/2024)   Overall Financial Resource Strain (CARDIA)    Difficulty of Paying Living Expenses: Not hard at all  Food Insecurity: Unknown (07/17/2024)   Hunger Vital Sign    Worried About Running Out of Food in the Last Year: Never true    Ran Out of Food in the Last Year: Not on file  Transportation Needs: No Transportation Needs (07/17/2024)   PRAPARE - Administrator, Civil Service (Medical): No    Lack of Transportation (Non-Medical): No  Physical Activity: Sufficiently Active (07/17/2024)   Exercise Vital Sign    Days of Exercise per Week: 4 days    Minutes of Exercise per Session: 40 min  Stress: No Stress Concern Present (07/17/2024)   [employer] of  Occupational Health - Occupational Stress Questionnaire    Feeling of Stress: Not at all  Social Connections: Socially Integrated (07/17/2024)   Social Connection and Isolation Panel    Frequency of Communication with Friends and Family: More than three times a week    Frequency of Social Gatherings with Friends and Family: More than three times a week    Attends Religious Services: More than 4 times per year    Active Member of [employer] or Organizations: Yes    Attends Engineer, structural: More than 4 times per year    Marital Status: Married     Review of Systems  Constitutional:  Negative for appetite change and unexpected weight change.  HENT:  Negative for congestion and sinus pressure.   Respiratory:  Negative for cough, chest tightness and shortness of breath.   Cardiovascular:   Negative for chest pain, palpitations and leg swelling.  Gastrointestinal:  Negative for abdominal pain, diarrhea, nausea and vomiting.  Genitourinary:  Negative for difficulty urinating and dysuria.  Musculoskeletal:  Negative for myalgias.       Right wrist pain as outlined.   Skin:  Negative for color change and rash.  Neurological:  Negative for dizziness and headaches.  Psychiatric/Behavioral:  Negative for agitation and dysphoric mood.        Objective:     BP 120/68 (BP Location: Left Arm, Patient Position: Sitting, Cuff Size: Small)   Pulse (!) 105   Temp 98.2 F (36.8 C) (Oral)   Ht 4' 9 (1.448 m)   Wt 116 lb 9.6 oz (52.9 kg)   SpO2 95%   BMI 25.23 kg/m  Wt Readings from Last 3 Encounters:  07/18/24 116 lb 9.6 oz (52.9 kg)  04/25/24 116 lb 9.6 oz (52.9 kg)  01/25/24 118 lb 9.6 oz (53.8 kg)    Physical Exam Vitals reviewed.  Constitutional:      General: She is not in acute distress.    Appearance: Normal appearance. She is well-developed.  HENT:     Head: Normocephalic and atraumatic.     Right Ear: External ear normal.     Left Ear: External ear normal.     Mouth/Throat:     Pharynx: No oropharyngeal exudate or posterior oropharyngeal erythema.  Eyes:     General: No scleral icterus.       Right eye: No discharge.        Left eye: No discharge.     Conjunctiva/sclera: Conjunctivae normal.  Neck:     Thyroid : No thyromegaly.  Cardiovascular:     Rate and Rhythm: Normal rate and regular rhythm.  Pulmonary:     Effort: No tachypnea, accessory muscle usage or respiratory distress.     Breath sounds: Normal breath sounds. No decreased breath sounds or wheezing.  Chest:  Breasts:    Right: No inverted nipple, mass, nipple discharge or tenderness (no axillary adenopathy).     Left: No inverted nipple, mass, nipple discharge or tenderness (no axilarry adenopathy).  Abdominal:     General: Bowel sounds are normal.     Palpations: Abdomen is soft.      Tenderness: There is no abdominal tenderness.  Musculoskeletal:        General: No swelling or tenderness.     Cervical back: Neck supple. No tenderness.     Comments: Minimal tenderness to palpation - right wrist. Good rom.   Lymphadenopathy:     Cervical: No cervical adenopathy.  Skin:    Comments: Erythematous  based rash - beneath breast.   Neurological:     Mental Status: She is alert and oriented to person, place, and time.  Psychiatric:        Mood and Affect: Mood normal.        Behavior: Behavior normal.         Outpatient Encounter Medications as of 07/18/2024  Medication Sig   aspirin  EC 81 MG tablet Take 1 tablet (81 mg total) by mouth daily.   clopidogrel  (PLAVIX ) 75 MG tablet Take 1 tablet (75 mg total) by mouth daily.   Fluticasone -Umeclidin-Vilant (TRELEGY ELLIPTA) 100-62.5-25 MCG/ACT AEPB INHALE 1 PUFF INTO THE LUNGS ONCE DAILY   rosuvastatin  (CRESTOR ) 10 MG tablet Take 1 tablet (10 mg total) by mouth daily.   sertraline  (ZOLOFT ) 50 MG tablet Take 1 tablet (50 mg total) by mouth daily.   [DISCONTINUED] amoxicillin -clavulanate (AUGMENTIN ) 875-125 MG tablet Take 1 tablet by mouth 2 (two) times daily. (Patient not taking: Reported on 04/25/2024)   [DISCONTINUED] ciclopirox  (LOPROX ) 0.77 % cream APPLY TOPICALLY 2 (TWO) TIMES DAILY. APPLY TWICE DAILY TO FEET AND IN BETWEEN TOES UNTIL CLEAR THEN ONE MORE WEEK AND THEN ONCE WEEKLY FOR MAINTENANCE.   [DISCONTINUED] nystatin  (MYCOSTATIN ) 100000 UNIT/ML suspension Take 5 mLs (500,000 Units total) by mouth 3 (three) times daily as needed. (Patient not taking: Reported on 07/18/2024)   No facility-administered encounter medications on file as of 07/18/2024.     Lab Results  Component Value Date   WBC 7.9 02/18/2024   HGB 12.9 02/18/2024   HCT 38.1 02/18/2024   PLT 249.0 02/18/2024   GLUCOSE 96 02/18/2024   CHOL 169 02/18/2024   TRIG 58.0 02/18/2024   HDL 109.90 02/18/2024   LDLCALC 47 02/18/2024   ALT 25 02/18/2024   AST  23 02/18/2024   NA 139 02/18/2024   K 4.4 02/18/2024   CL 106 02/18/2024   CREATININE 0.82 02/18/2024   BUN 15 02/18/2024   CO2 24 02/18/2024   TSH 1.04 02/18/2024   HGBA1C 5.9 02/18/2024    CT CHEST LUNG CA SCREEN LOW DOSE W/O CM Result Date: 05/08/2024 CLINICAL DATA:  Former 20+ pack-year smoker, quit 2021. EXAM: CT CHEST WITHOUT CONTRAST LOW-DOSE FOR LUNG CANCER SCREENING TECHNIQUE: Multidetector CT imaging of the chest was performed following the standard protocol without IV contrast. RADIATION DOSE REDUCTION: This exam was performed according to the departmental dose-optimization program which includes automated exposure control, adjustment of the mA and/or kV according to patient size and/or use of iterative reconstruction technique. COMPARISON:  04/19/2023. FINDINGS: Cardiovascular: Atherosclerotic calcification of the aorta, aortic valve and coronary arteries. Heart is at the upper limits of normal in size. No pericardial effusion. Mediastinum/Nodes: No pathologically enlarged mediastinal or axillary lymph nodes. Hilar regions are difficult to definitively evaluate without IV contrast. Esophagus is grossly unremarkable. Lungs/Pleura: Biapical pleuroparenchymal scarring. Centrilobular and paraseptal emphysema. Bibasilar scarring. No suspicious pulmonary nodules. No pleural fluid. Airway is unremarkable. Upper Abdomen: Visualized portions of the liver, adrenal glands, spleen, pancreas and stomach are grossly unremarkable. Musculoskeletal: Degenerative changes in the spine. Old midthoracic compression fracture with kyphosis. IMPRESSION: 1. Lung-RADS 1, negative. Continue annual screening with low-dose chest CT without contrast in 12 months. 2. Aortic atherosclerosis (ICD10-I70.0). Coronary artery calcification. 3.  Emphysema (ICD10-J43.9). Electronically Signed   By: Newell Eke M.D.   On: 05/08/2024 16:39       Assessment & Plan:  Healthcare maintenance Assessment & Plan: Physical today  07/18/24.  Mammogram 07/26/23 - Birads I.  Schedule  f/u mammogram. Cologuard negative  10/2018.  Colonoscopy 07/29/22 -  Recommended f/u in 3 years (Dr Dessa). One 15 mm polyp in the cecum, removed with a hot snare and removed piecemeal using a hot snare. Two 5 to 13 mm polyps in the transverse colon and in the proximal transverse colon, removed with a hot snare. Diverticulosis in the recto-sigmoid colon and in the sigmoid colon. Pathology:  COLON POLYP, CECUM; HOT SNARE: TUBULAR ADENOMA. NEGATIVE FOR HIGH-GRADE DYSPLASIA AND MALIGNANCY.  COLON POLYP, PROXIMAL TRANSVERSE; HOT SNARE: TUBULAR ADENOMA.  SESSILE SERRATED POLYP.  NEGATIVE FOR HIGH-GRADE DYSPLASIA AND MALIGNANCY.   Hyperlipidemia, unspecified hyperlipidemia type Assessment & Plan: Continue on crestor . Low cholesterol diet and exercise. Follow lipid panel.  Lab Results  Component Value Date   CHOL 169 02/18/2024   HDL 109.90 02/18/2024   LDLCALC 47 02/18/2024   TRIG 58.0 02/18/2024   CHOLHDL 2 02/18/2024     Orders: -     Basic metabolic panel with GFR; Future -     Hepatic function panel; Future -     Lipid panel; Future  Osteoporosis without current pathological fracture, unspecified osteoporosis type -     VITAMIN D  25 Hydroxy (Vit-D Deficiency, Fractures); Future  Hyperglycemia -     Hemoglobin A1c; Future  Visit for screening mammogram -     3D Screening Mammogram, Left and Right; Future  Right wrist pain Assessment & Plan: Wrist pain - s/p fall. Check xray   Orders: -     DG Wrist 2 Views Right; Future  Stress Assessment & Plan: Continue zoloft . Appears to be doing well.    Rash Assessment & Plan: Beneath breast. Nystatin  cream as directed.    PAD (peripheral artery disease) (HCC) Assessment & Plan: Evaluated by [facility] - 04/16/23 - ABI - Rt=1.15 and Lt=1.09 (previous ABI's Rt=1.08 and Lt=0.90) Duplex ultrasound of the right lower extremity shows triphasic waveforms with biphasic on the left and normal toe  waveforms bilaterally. Continue f/u with [facility]. Continue risk factor modification.    History of colonic polyps Assessment & Plan: Colonoscopy 07/2022 - recommended f/u in 3 years.    Chronic obstructive pulmonary disease, unspecified COPD type (HCC) Assessment & Plan: Breathing stable.    Closed nondisplaced fracture of acromial end of left clavicle with routine healing, subsequent encounter Assessment & Plan: Seeing emerge. Exercises. Continue f/u with ortho.    Adrenal nodule [facility]) Assessment & Plan: Found incidentally on CT. Being followed by endocrinology. Cortisol suppressed.    Aortic atherosclerosis (HCC) Assessment & Plan: Continue crestor .       Allena Hamilton, MD

## 2024-07-18 NOTE — Assessment & Plan Note (Addendum)
 Physical today 07/18/24.  Mammogram 07/26/23 - Birads I.  Schedule f/u mammogram. Cologuard negative  10/2018.  Colonoscopy 07/29/22 -  Recommended f/u in 3 years (Dr Dessa). One 15 mm polyp in the cecum, removed with a hot snare and removed piecemeal using a hot snare. Two 5 to 13 mm polyps in the transverse colon and in the proximal transverse colon, removed with a hot snare. Diverticulosis in the recto-sigmoid colon and in the sigmoid colon. Pathology:  COLON POLYP, CECUM; HOT SNARE: TUBULAR ADENOMA. NEGATIVE FOR HIGH-GRADE DYSPLASIA AND MALIGNANCY.  COLON POLYP, PROXIMAL TRANSVERSE; HOT SNARE: TUBULAR ADENOMA.  SESSILE SERRATED POLYP.  NEGATIVE FOR HIGH-GRADE DYSPLASIA AND MALIGNANCY.

## 2024-07-20 ENCOUNTER — Encounter: Payer: Self-pay | Admitting: Internal Medicine

## 2024-07-20 MED ORDER — NYSTATIN 100000 UNIT/GM EX CREA: 1.0000 | TOPICAL_CREAM | Freq: Two times a day (BID) | CUTANEOUS | 0 refills | Status: DC

## 2024-07-20 NOTE — Telephone Encounter (Signed)
 Pt said she thought yall discussed a cream but wasn't 100% sure. Advised you are out of office but I would send you a message to confirm. Patient says it is fine, no rush.

## 2024-07-20 NOTE — Telephone Encounter (Signed)
Rx sent in for nystatin cream 

## 2024-07-23 ENCOUNTER — Encounter: Payer: Self-pay | Admitting: Internal Medicine

## 2024-07-23 NOTE — Assessment & Plan Note (Signed)
Continue zoloft.  Appears to be doing well.

## 2024-07-23 NOTE — Assessment & Plan Note (Signed)
 Continue crestor

## 2024-07-23 NOTE — Assessment & Plan Note (Signed)
 Seeing emerge. Exercises. Continue f/u with ortho.

## 2024-07-23 NOTE — Assessment & Plan Note (Signed)
 Beneath breast. Nystatin  cream as directed.

## 2024-07-23 NOTE — Assessment & Plan Note (Signed)
 Evaluated by [facility] - 04/16/23 - ABI - Rt=1.15 and Lt=1.09 (previous ABI's Rt=1.08 and Lt=0.90) Duplex ultrasound of the right lower extremity shows triphasic waveforms with biphasic on the left and normal toe waveforms bilaterally. Continue f/u with [facility]. Continue risk factor modification.

## 2024-07-23 NOTE — Assessment & Plan Note (Signed)
 Breathing stable.

## 2024-07-23 NOTE — Assessment & Plan Note (Signed)
 Found incidentally on CT. Being followed by endocrinology. Cortisol suppressed.

## 2024-07-23 NOTE — Assessment & Plan Note (Signed)
 Continue on crestor . Low cholesterol diet and exercise. Follow lipid panel.  Lab Results  Component Value Date   CHOL 169 02/18/2024   HDL 109.90 02/18/2024   LDLCALC 47 02/18/2024   TRIG 58.0 02/18/2024   CHOLHDL 2 02/18/2024

## 2024-07-23 NOTE — Assessment & Plan Note (Signed)
 Colonoscopy 07/2022 - recommended f/u in 3 years.

## 2024-07-24 ENCOUNTER — Other Ambulatory Visit (INDEPENDENT_AMBULATORY_CARE_PROVIDER_SITE_OTHER)

## 2024-07-24 DIAGNOSIS — M810 Age-related osteoporosis without current pathological fracture: Secondary | ICD-10-CM | POA: Diagnosis not present

## 2024-07-24 DIAGNOSIS — E785 Hyperlipidemia, unspecified: Secondary | ICD-10-CM

## 2024-07-24 DIAGNOSIS — R739 Hyperglycemia, unspecified: Secondary | ICD-10-CM

## 2024-07-24 LAB — BASIC METABOLIC PANEL WITH GFR
BUN: 16 mg/dL (ref 6–23)
CO2: 24 meq/L (ref 19–32)
Calcium: 9.4 mg/dL (ref 8.4–10.5)
Chloride: 104 meq/L (ref 96–112)
Creatinine, Ser: 0.84 mg/dL (ref 0.40–1.20)
GFR: 68.22 mL/min (ref >60.00)
Glucose, Bld: 95 mg/dL (ref 70–99)
Potassium: 4.4 meq/L (ref 3.5–5.1)
Sodium: 139 meq/L (ref 135–145)

## 2024-07-24 LAB — LIPID PANEL
Cholesterol: 174 mg/dL (ref 0–200)
HDL: 109 mg/dL (ref >39.00)
LDL Cholesterol: 49 mg/dL (ref 0–99)
NonHDL: 64.77
Total CHOL/HDL Ratio: 2
Triglycerides: 78 mg/dL (ref 0.0–149.0)
VLDL: 15.6 mg/dL (ref 0.0–40.0)

## 2024-07-24 LAB — VITAMIN D 25 HYDROXY (VIT D DEFICIENCY, FRACTURES): VITD: 69.84 ng/mL (ref 30.00–100.00)

## 2024-07-24 LAB — HEPATIC FUNCTION PANEL
ALT: 21 U/L (ref 0–35)
AST: 20 U/L (ref 0–37)
Albumin: 4.2 g/dL (ref 3.5–5.2)
Alkaline Phosphatase: 82 U/L (ref 39–117)
Bilirubin, Direct: 0.1 mg/dL (ref 0.0–0.3)
Total Bilirubin: 0.4 mg/dL (ref 0.2–1.2)
Total Protein: 6.7 g/dL (ref 6.0–8.3)

## 2024-07-24 LAB — HEMOGLOBIN A1C: Hgb A1c MFr Bld: 6 % (ref 4.6–6.5)

## 2024-08-21 ENCOUNTER — Ambulatory Visit
Admission: RE | Admit: 2024-08-21 | Discharge: 2024-08-21 | Disposition: A | Discharge status: Home / Self Care | Source: Ambulatory Visit | Attending: Internal Medicine | Admitting: Internal Medicine

## 2024-08-21 DIAGNOSIS — Z1231 Encounter for screening mammogram for malignant neoplasm of breast: Secondary | ICD-10-CM | POA: Diagnosis not present

## 2024-09-01 DIAGNOSIS — S42032A Displaced fracture of lateral end of left clavicle, initial encounter for closed fracture: Secondary | ICD-10-CM | POA: Diagnosis not present

## 2024-09-11 DIAGNOSIS — R5381 Other malaise: Secondary | ICD-10-CM | POA: Diagnosis not present

## 2024-09-11 DIAGNOSIS — F17210 Nicotine dependence, cigarettes, uncomplicated: Secondary | ICD-10-CM | POA: Diagnosis not present

## 2024-09-11 DIAGNOSIS — J449 Chronic obstructive pulmonary disease, unspecified: Secondary | ICD-10-CM | POA: Diagnosis not present

## 2024-09-17 ENCOUNTER — Other Ambulatory Visit: Payer: Self-pay | Admitting: Internal Medicine

## 2024-10-02 ENCOUNTER — Telehealth: Payer: Self-pay

## 2024-10-02 DIAGNOSIS — E2839 Other primary ovarian failure: Secondary | ICD-10-CM

## 2024-10-02 NOTE — Telephone Encounter (Signed)
 [employer] forms placed in provider to be signed folder

## 2024-10-02 NOTE — Telephone Encounter (Signed)
 She has an appt with me in 11/2024. Can discuss with her then.  Previously had bone density and I discussed treatment options.  If agreeable can schedule another bone density or can wait and discuss during her 11/2024 appt.

## 2024-10-03 NOTE — Telephone Encounter (Unsigned)
 Copied from CRM [id number]. Topic: Clinical - Request for Lab/Test Order >> Oct 03, 2024  4:19 PM Dedra B wrote: Reason for CRM: Pt returning call regarding bone density. Relayed message verbatim. Pt said that she would like to go ahead and schedule it.

## 2024-10-03 NOTE — Addendum Note (Signed)
 Addended by: Caycee Wanat on: 10/03/2024 04:55 PM   Modules accepted: Orders

## 2024-10-03 NOTE — Telephone Encounter (Signed)
 Lvm okay to relay

## 2024-10-03 NOTE — Telephone Encounter (Signed)
 Order pended for approval

## 2024-10-04 ENCOUNTER — Telehealth: Payer: Self-pay | Admitting: Internal Medicine

## 2024-10-04 NOTE — Telephone Encounter (Signed)
 I ordered a bone density. Needs to be scheduled. If we are unable to schedule, please call and give Toni Gardner the number to call to schedule.

## 2024-10-04 NOTE — Addendum Note (Signed)
 Addended by: GLENDIA ALLENA RAMAN on: 10/04/2024 09:56 PM   Modules accepted: Orders

## 2024-10-04 NOTE — Telephone Encounter (Signed)
Order placed for bone density  

## 2024-10-05 ENCOUNTER — Ambulatory Visit

## 2024-10-05 ENCOUNTER — Other Ambulatory Visit: Payer: Self-pay | Admitting: Internal Medicine

## 2024-10-05 DIAGNOSIS — L729 Follicular cyst of the skin and subcutaneous tissue, unspecified: Secondary | ICD-10-CM | POA: Diagnosis not present

## 2024-10-05 DIAGNOSIS — L219 Seborrheic dermatitis, unspecified: Secondary | ICD-10-CM | POA: Diagnosis not present

## 2024-10-05 MED ORDER — CLOBETASOL PROPIONATE 0.05 % EX SOLN: CUTANEOUS | 5 refills | Status: AC

## 2024-10-05 MED ORDER — KETOCONAZOLE 2 % EX SHAM: MEDICATED_SHAMPOO | CUTANEOUS | 5 refills | Status: DC

## 2024-10-05 MED ORDER — CLOBETASOL PROPIONATE 0.05 % EX SHAM: MEDICATED_SHAMPOO | CUTANEOUS | 5 refills | Status: AC

## 2024-10-05 NOTE — Progress Notes (Signed)
    Subjective   Toni Gardner is a 73 y.o. female who presents for the following: Rash. Patient is established patient   Today patient reports: Scaly rash in the scalp Has been treated in the past but not currently.   Review of Systems:    No other skin or systemic complaints except as noted in HPI or Assessment and Plan.  The following portions of the chart were reviewed this encounter and updated as appropriate: medications, allergies, medical history  Relevant Medical History:  n/a   Objective  Well appearing patient in no apparent distress; mood and affect are within normal limits. Examination was performed of the: Focused Exam of: Scalp, ears   Examination notable for: - Erythema and scaling of the scalp, ear canals, and central face > rest of face. - A firm, skin-colored, subcutaneous nodule that is freely movable and has a central punctum located on the right earlobe.    Assessment & Plan   Seborrheic dermatitis Chronic and persistent condition with duration or expected duration over one year. Condition is symptomatic and bothersome to patient. Patient is flaring and not currently at treatment goal.  - Prior treatments: Vtama, Zoryve, mometasone , keto shampoo  - Discussed diagnosis, typical course, and treatment options for this condition - Explained to the patient the chronic nature of this diagnosis - Start clobetasol 0.05% solution to affect areas a needed  - Start clobetasol 0.05% shampoo to affect areas a needed  - Start ketoconazole  2% shampoo TIW, apply to scalp and affected areas of face/body and allow the shampoo to sit for 5-10 minutes before rinsing off - Consider alternating with use of Head and Shoulders or Selsun Blue anti-dandruff shampoo which contains zinc pyrithione 1%  Subcutaneous cyst, favor epidermal inclusion cyst of the right earlobe - Explained to patient this most likely is consistent with an epidermal inclusion cyst, which represents trapped hair  follicule and skin cells under the skin - Benign, patient reassured   Chronic and persistent condition with duration or expected duration over one year. Condition is symptomatic and bothersome to patient. Patient is flaring and not currently at treatment goal.   Procedures, orders, diagnosis for this visit:    There are no diagnoses linked to this encounter.  Return to clinic: Return for TBSE.  I, Emerick Ege, CMA am acting as scribe for Lauraine JAYSON Kanaris, MD.   Documentation: I have reviewed the above documentation for accuracy and completeness, and I agree with the above.  Lauraine JAYSON Kanaris, MD

## 2024-10-05 NOTE — Patient Instructions (Addendum)

## 2024-10-05 NOTE — Telephone Encounter (Signed)
 Lvm  fpr pt to return call. Okay to relay

## 2024-10-06 NOTE — Telephone Encounter (Unsigned)
 Copied from CRM [id number]. Topic: General - Other >> Oct 05, 2024 12:19 PM Alfonso HERO wrote: Reason for CRM: patient unsure who to call to schedule her bone density test. Asking for you to call her back.

## 2024-10-25 NOTE — Telephone Encounter (Signed)
 open in error

## 2024-10-30 ENCOUNTER — Ambulatory Visit (INDEPENDENT_AMBULATORY_CARE_PROVIDER_SITE_OTHER): Admitting: *Deleted

## 2024-10-30 VITALS — Ht <= 58 in | Wt 118.0 lb

## 2024-10-30 DIAGNOSIS — Z0000 Encounter for general adult medical examination without abnormal findings: Secondary | ICD-10-CM | POA: Diagnosis not present

## 2024-10-30 NOTE — Patient Instructions (Signed)
 Toni Gardner,  Thank you for taking the time for your Medicare Wellness Visit. I appreciate your continued commitment to your health goals. Please review the care plan we discussed, and feel free to reach out if I can assist you further.  Please note that Annual Wellness Visits do not include a physical exam. Some assessments may be limited, especially if the visit was conducted virtually. If needed, we may recommend an in-person follow-up with your provider.  Ongoing Care Seeing your primary care provider every 3 to 6 months helps us  monitor your health and provide consistent, personalized care.  Remember to get your flu and tetanus (Tdap) vaccines.   Referrals If a referral was made during today's visit and you haven't received any updates within two weeks, please contact the referred provider directly to check on the status.  Recommended Screenings:  Health Maintenance  Topic Date Due   Hepatitis C Screening  Never done   DTaP/Tdap/Td vaccine (1 - Tdap) Never done   COVID-19 Vaccine (4 - 2025-26 season) 08/07/2024   Flu Shot  03/06/2025*   Colon Cancer Screening  07/29/2025   Breast Cancer Screening  08/21/2025   Medicare Annual Wellness Visit  10/30/2025   Pneumococcal Vaccine for age over 63  Completed   Osteoporosis screening with Bone Density Scan  Completed   Zoster (Shingles) Vaccine  Completed   Meningitis B Vaccine  Aged Out   Cologuard (Stool DNA test)  Discontinued  *Topic was postponed. The date shown is not the original due date.       10/30/2024   10:59 AM  Advanced Directives  Does Patient Have a Medical Advance Directive? No  Would patient like information on creating a medical advance directive? No - Patient declined    Vision: Annual vision screenings are recommended for early detection of glaucoma, cataracts, and diabetic retinopathy. These exams can also reveal signs of chronic conditions such as diabetes and high blood pressure.  Dental: Annual dental  screenings help detect early signs of oral cancer, gum disease, and other conditions linked to overall health, including heart disease and diabetes.  Please see the attached documents for additional preventive care recommendations.

## 2024-10-30 NOTE — Progress Notes (Signed)
 Chief Complaint  Patient presents with   Medicare Wellness     Subjective:   Toni Gardner is a 73 y.o. female who presents for a Medicare Annual Wellness Visit.  Allergies (verified) Sulfa antibiotics   History: Past Medical History:  Diagnosis Date   Colon polyp    Dermatitis    PONV (postoperative nausea and vomiting)    Urine incontinence    Past Surgical History:  Procedure Laterality Date   ABDOMINAL HYSTERECTOMY     ADENOIDECTOMY     COLONOSCOPY  06/28/2008   Dr Dessa   COLONOSCOPY WITH PROPOFOL  N/A 07/29/2022   Procedure: COLONOSCOPY WITH PROPOFOL ;  Surgeon: Dessa Reyes ORN, MD;  Location: [facility] ENDOSCOPY;  Service: Endoscopy;  Laterality: N/A;   LOWER EXTREMITY ANGIOGRAPHY Right 05/09/2020   Procedure: LOWER EXTREMITY ANGIOGRAPHY;  Surgeon: Marea Selinda RAMAN, MD;  Location: [facility] INVASIVE [facility];  Service: Cardiovascular;  Laterality: Right;   Family History  Problem Relation Age of Onset   Heart disease Father    Colon polyps Sister    Breast cancer Maternal Aunt 61 - 79   Cancer Maternal Uncle        colon   Social History   Occupational History   Not on file  Tobacco Use   Smoking status: Former    Current packs/day: 0.00    Types: Cigarettes    Start date: 04/11/2000    Quit date: 04/11/2020    Years since quitting: 4.5   Smokeless tobacco: Never  Vaping Use   Vaping status: Never Used  Substance and Sexual Activity   Alcohol use: Yes   Drug use: No   Sexual activity: Not on file   Tobacco Counseling Counseling given: Not Answered  SDOH Screenings   Food Insecurity: No Food Insecurity (10/30/2024)  Housing: Low Risk  (10/30/2024)  Transportation Needs: No Transportation Needs (10/30/2024)  Utilities: Not At Risk (10/30/2024)  Alcohol Screen: Low Risk  (10/30/2024)  Depression (PHQ2-9): Low Risk  (10/30/2024)  Financial Resource Strain: Low Risk  (10/30/2024)  Physical Activity: Insufficiently Active (10/30/2024)  Social Connections:  Moderately Integrated (10/30/2024)  Stress: No Stress Concern Present (10/30/2024)  Tobacco Use: Medium Risk (10/30/2024)  Health Literacy: Adequate Health Literacy (10/30/2024)   See flowsheets for full screening details  Depression Screen PHQ 2 & 9 Depression Scale- Over the past 2 weeks, how often have you been bothered by any of the following problems? Little interest or pleasure in doing things: 0 Feeling down, depressed, or hopeless (PHQ Adolescent also includes...irritable): 0 PHQ-2 Total Score: 0 Trouble falling or staying asleep, or sleeping too much: 0 Feeling tired or having little energy: 0 Poor appetite or overeating (PHQ Adolescent also includes...weight loss): 0 Feeling bad about yourself - or that you are a failure or have let yourself or your family down: 0 Trouble concentrating on things, such as reading the newspaper or watching television (PHQ Adolescent also includes...like school work): 0 Moving or speaking so slowly that other people could have noticed. Or the opposite - being so fidgety or restless that you have been moving around a lot more than usual: 0 Thoughts that you would be better off dead, or of hurting yourself in some way: 0 PHQ-9 Total Score: 0 If you checked off any problems, how difficult have these problems made it for you to do your work, take care of things at home, or get along with other people?: Not difficult at all     Goals Addressed  This Visit's Progress    Patient Stated       Wants to stay active and continue to walk       Visit info / Clinical Intake: Medicare Wellness Visit Type:: Subsequent Annual Wellness Visit Persons participating in visit:: patient Medicare Wellness Visit Mode:: Telephone If telephone:: video declined Because this visit was a virtual/telehealth visit:: pt reported vitals If Telephone or Video please confirm:: I connected with the patient using audio enabled telemedicine application and  verified that I am speaking with the correct person using two identifiers; I discussed the limitations of evaluation and management by telemedicine; The patient expressed understanding and agreed to proceed Patient Location:: Home Provider Location:: Office/Home Information given by:: patient Interpreter Needed?: No Pre-visit prep was completed: yes AWV questionnaire completed by patient prior to visit?: no Living arrangements:: lives with spouse/significant other Patient's Overall Health Status Rating: good Typical amount of pain: none Does pain affect daily life?: no Are you currently prescribed opioids?: no  Dietary Habits and Nutritional Risks How many meals a day?: 2 Eats fruit and vegetables daily?: yes Most meals are obtained by: preparing own meals In the last 2 weeks, have you had any of the following?: none Diabetic:: no  Functional Status Activities of Daily Living (to include ambulation/medication): Independent Ambulation: Independent Medication Administration: Independent Home Management: Independent Manage your own finances?: yes Primary transportation is: driving Concerns about vision?: no *vision screening is required for WTM* Concerns about hearing?: no  Fall Screening Falls in the past year?: 1 Number of falls in past year: 0 Was there an injury with Fall?: 1 Fall Risk Category Calculator: 2 Patient Fall Risk Level: Moderate Fall Risk  Fall Risk Patient at Risk for Falls Due to: History of fall(s); Impaired balance/gait (fell over book bag) Fall risk Follow up: Falls evaluation completed; Falls prevention discussed  Home and Transportation Safety: All rugs have non-skid backing?: yes All stairs or steps have railings?: N/A, no stairs Grab bars in the bathtub or shower?: (!) no Have non-skid surface in bathtub or shower?: yes Good home lighting?: yes Regular seat belt use?: yes Hospital stays in the last year:: no  Cognitive Assessment Difficulty  concentrating, remembering, or making decisions? : no Will 6CIT or Mini Cog be Completed: yes What year is it?: 0 points What month is it?: 0 points Give patient an address phrase to remember (5 components): [address] [place] About what time is it?: 0 points Count backwards from 20 to 1: 0 points Say the months of the year in reverse: 2 points Repeat the address phrase from earlier: 0 points 6 CIT Score: 2 points  Advance Directives (For Healthcare) Does Patient Have a Medical Advance Directive?: No Would patient like information on creating a medical advance directive?: No - Patient declined  Reviewed/Updated  Reviewed/Updated: Reviewed All (Medical, Surgical, Family, Medications, Allergies, Care Teams, Patient Goals)        Objective:    Today's Vitals   10/30/24 1055  Weight: 118 lb (53.5 kg)  Height: 4' 9 (1.448 m)   Body mass index is 25.53 kg/m.  Current Medications (verified) Outpatient Encounter Medications as of 10/30/2024  Medication Sig   aspirin  EC 81 MG tablet Take 1 tablet (81 mg total) by mouth daily.   budesonide-glycopyrrolate-formoterol (BREZTRI AEROSPHERE) 160-9-4.8 MCG/ACT AERO inhaler Inhale 2 puffs into the lungs 2 (two) times daily.   calcium  carbonate (OSCAL) 1500 (600 Ca) MG TABS tablet Take by mouth 2 (two) times daily with a  meal.   cholecalciferol (VITAMIN D3) 25 MCG (1000 UNIT) tablet Take 1,000 Units by mouth 2 (two) times daily.   clobetasol  (TEMOVATE ) 0.05 % external solution Apply 1 mL to affected areas of skin twice daily   Clobetasol  Propionate 0.05 % shampoo Apply 4 mL of medication to affected areas of scalp. Let sit for 15 minutes. Then add water, lather, and rinse out   clopidogrel  (PLAVIX ) 75 MG tablet TAKE 1 TABLET BY MOUTH EVERY DAY   rosuvastatin  (CRESTOR ) 10 MG tablet Take 1 tablet (10 mg total) by mouth daily.   sertraline  (ZOLOFT ) 50 MG tablet TAKE 1 TABLET BY MOUTH EVERY DAY   Fluticasone -Umeclidin-Vilant (TRELEGY  ELLIPTA) 100-62.5-25 MCG/ACT AEPB INHALE 1 PUFF INTO THE LUNGS ONCE DAILY (Patient not taking: Reported on 10/30/2024)   ketoconazole  (NIZORAL ) 2 % shampoo Apply to scalp as a shampoo 2-3 times weekly. Let sit for 5 minutes before rinsing. (Patient not taking: Reported on 10/30/2024)   nystatin  cream (MYCOSTATIN ) Apply 1 Application topically 2 (two) times daily. (Patient not taking: Reported on 10/30/2024)   No facility-administered encounter medications on file as of 10/30/2024.   Hearing/Vision screen Hearing Screening - Comments:: No issues Vision Screening - Comments:: Readers, [facility], up to date Immunizations and Health Maintenance Health Maintenance  Topic Date Due   Hepatitis C Screening  Never done   DTaP/Tdap/Td (1 - Tdap) Never done   COVID-19 Vaccine (4 - 2025-26 season) 08/07/2024   Influenza Vaccine  03/06/2025 (Originally 07/07/2024)   Colonoscopy  07/29/2025   Mammogram  08/21/2025   Medicare Annual Wellness (AWV)  10/30/2025   Pneumococcal Vaccine: 50+ Years  Completed   Bone Density Scan  Completed   Zoster Vaccines- Shingrix  Completed   Meningococcal B Vaccine  Aged Out   Fecal DNA (Cologuard)  Discontinued        Assessment/Plan:  This is a routine wellness examination for Toni Gardner.  Patient Care Team: Glendia Shad, MD as PCP - General (Internal Medicine) Parris Manna, MD as Consulting Physician (Pulmonary Disease) Marea Selinda RAMAN, MD as Referring Physician (Vascular Surgery)  I have personally reviewed and noted the following in the patient's chart:   Medical and social history Use of alcohol, tobacco or illicit drugs  Current medications and supplements including opioid prescriptions. Functional ability and status Nutritional status Physical activity Advanced directives List of other physicians Hospitalizations, surgeries, and ER visits in previous 12 months Vitals Screenings to include cognitive, depression, and falls Referrals and  appointments  No orders of the defined types were placed in this encounter.  In addition, I have reviewed and discussed with patient certain preventive protocols, quality metrics, and best practice recommendations. A written personalized care plan for preventive services as well as general preventive health recommendations were provided to patient.   Angeline Fredericks, LPN   [id number]   Return in 1 year (on 10/30/2025).  After Visit Summary: (MyChart) Due to this being a telephonic visit, the after visit summary with patients personalized plan was offered to patient via MyChart   Nurse Notes: Discussed the need to update flu and tetanus (Tdap) vaccines. Patient declines covid vaccine and Hepatitis C screening.

## 2024-11-01 ENCOUNTER — Ambulatory Visit
Admission: RE | Admit: 2024-11-01 | Discharge: 2024-11-01 | Disposition: A | Discharge status: Home / Self Care | Source: Ambulatory Visit | Attending: Internal Medicine | Admitting: Internal Medicine

## 2024-11-01 DIAGNOSIS — E2839 Other primary ovarian failure: Secondary | ICD-10-CM | POA: Diagnosis not present

## 2024-11-01 DIAGNOSIS — M810 Age-related osteoporosis without current pathological fracture: Secondary | ICD-10-CM | POA: Diagnosis not present

## 2024-11-05 ENCOUNTER — Ambulatory Visit: Payer: Self-pay | Admitting: Internal Medicine

## 2024-11-16 ENCOUNTER — Other Ambulatory Visit: Payer: Self-pay | Admitting: Internal Medicine

## 2024-11-17 ENCOUNTER — Encounter: Payer: Self-pay | Admitting: Internal Medicine

## 2024-11-17 ENCOUNTER — Ambulatory Visit: Admitting: Internal Medicine

## 2024-11-17 VITALS — BP 104/60 | HR 84 | Temp 98.0°F | Ht <= 58 in | Wt 117.6 lb

## 2024-11-17 DIAGNOSIS — M25552 Pain in left hip: Secondary | ICD-10-CM | POA: Diagnosis not present

## 2024-11-17 DIAGNOSIS — R739 Hyperglycemia, unspecified: Secondary | ICD-10-CM | POA: Diagnosis not present

## 2024-11-17 DIAGNOSIS — E279 Disorder of adrenal gland, unspecified: Secondary | ICD-10-CM

## 2024-11-17 DIAGNOSIS — I739 Peripheral vascular disease, unspecified: Secondary | ICD-10-CM | POA: Diagnosis not present

## 2024-11-17 DIAGNOSIS — E785 Hyperlipidemia, unspecified: Secondary | ICD-10-CM

## 2024-11-17 DIAGNOSIS — F439 Reaction to severe stress, unspecified: Secondary | ICD-10-CM | POA: Diagnosis not present

## 2024-11-17 DIAGNOSIS — J449 Chronic obstructive pulmonary disease, unspecified: Secondary | ICD-10-CM

## 2024-11-17 DIAGNOSIS — Z860100 Personal history of colon polyps, unspecified: Secondary | ICD-10-CM

## 2024-11-17 DIAGNOSIS — M810 Age-related osteoporosis without current pathological fracture: Secondary | ICD-10-CM | POA: Diagnosis not present

## 2024-11-17 DIAGNOSIS — M25551 Pain in right hip: Secondary | ICD-10-CM | POA: Diagnosis not present

## 2024-11-17 MED ORDER — ROSUVASTATIN CALCIUM 10 MG PO TABS: 10.0000 mg | ORAL_TABLET | Freq: Every day | ORAL | 3 refills | Status: AC

## 2024-11-17 NOTE — Patient Instructions (Signed)
 Reclast - IV medication for osteoporosis

## 2024-11-17 NOTE — Progress Notes (Unsigned)
 Subjective:    Patient ID: Toni Gardner, female    DOB: 14-Jul-1951, 73 y.o.   MRN: [id number]  Patient here for  Chief Complaint  Patient presents with   Discuss lab results    DEXA    HPI Here for a scheduled follow up. Had f/u with [facility] - 04/25/24 - ABIs ok. Continue aspirin , plavix  and crestor . F/u one year. Has seen Dr Janelle. Last evaluated 09/11/24 - changed to Breztri. Breathing stable. No increased sob. No cough. F/u CT in June. Per note - f/u spring 2026. Reports noticing some bilateral posterior/lateral hip tightness. Notices after walking a while. Rest - resolves. Discussed exercise/stretches. Discussed PT. Discussed bone density results - osteoporosis. Discussed taking calcium , vitamin D  and weight bearing exercise. Discussed prescription medicatinos.    Past Medical History:  Diagnosis Date   Colon polyp    Dermatitis    PONV (postoperative nausea and vomiting)    Urine incontinence    Past Surgical History:  Procedure Laterality Date   ABDOMINAL HYSTERECTOMY     ADENOIDECTOMY     COLONOSCOPY  06/28/2008   Dr Dessa   COLONOSCOPY WITH PROPOFOL  N/A 07/29/2022   Procedure: COLONOSCOPY WITH PROPOFOL ;  Surgeon: Dessa Reyes ORN, MD;  Location: [facility] ENDOSCOPY;  Service: Endoscopy;  Laterality: N/A;   LOWER EXTREMITY ANGIOGRAPHY Right 05/09/2020   Procedure: LOWER EXTREMITY ANGIOGRAPHY;  Surgeon: Marea Selinda RAMAN, MD;  Location: [facility] INVASIVE CV LAB;  Service: Cardiovascular;  Laterality: Right;   Family History  Problem Relation Age of Onset   Heart disease Father    Colon polyps Sister    Breast cancer Maternal Aunt 43 - 79   Cancer Maternal Uncle        colon   Social History   Socioeconomic History   Marital status: Married    Spouse name: Not on file   Number of children: Not on file   Years of education: Not on file   Highest education level: Bachelor's degree (e.g., BA, AB, BS)  Occupational History   Not on file  Tobacco Use   Smoking status: Former     Current packs/day: 0.00    Types: Cigarettes    Start date: 04/11/2000    Quit date: 04/11/2020    Years since quitting: 4.6   Smokeless tobacco: Never  Vaping Use   Vaping status: Never Used  Substance and Sexual Activity   Alcohol use: Yes   Drug use: No   Sexual activity: Not on file  Other Topics Concern   Not on file  Social History Narrative   Married   Works part time   Social Drivers of Health   Tobacco Use: Medium Risk (11/18/2024)   Patient History    Smoking Tobacco Use: Former    Smokeless Tobacco Use: Never    Passive Exposure: Not on Actuary Strain: Low Risk (10/30/2024)   Overall Financial Resource Strain (CARDIA)    Difficulty of Paying Living Expenses: Not hard at all  Food Insecurity: No Food Insecurity (10/30/2024)   Epic    Worried About Programme Researcher, Broadcasting/film/video in the Last Year: Never true    Ran Out of Food in the Last Year: Never true  Transportation Needs: No Transportation Needs (10/30/2024)   Epic    Lack of Transportation (Medical): No    Lack of Transportation (Non-Medical): No  Physical Activity: Insufficiently Active (10/30/2024)   Exercise Vital Sign    Days of Exercise per Week: 4 days  Minutes of Exercise per Session: 20 min  Stress: No Stress Concern Present (10/30/2024)   [employer] of Occupational Health - Occupational Stress Questionnaire    Feeling of Stress: Only a little  Social Connections: Moderately Integrated (10/30/2024)   Social Connection and Isolation Panel    Frequency of Communication with Friends and Family: More than three times a week    Frequency of Social Gatherings with Friends and Family: More than three times a week    Attends Religious Services: More than 4 times per year    Active Member of Clubs or Organizations: No    Attends Banker Meetings: Never    Marital Status: Married  Depression (PHQ2-9): Low Risk (11/17/2024)   Depression (PHQ2-9)    PHQ-2 Score: 0  Alcohol  Screen: Low Risk (10/30/2024)   Alcohol Screen    Last Alcohol Screening Score (AUDIT): 2  Housing: Low Risk (10/30/2024)   Epic    Unable to Pay for Housing in the Last Year: No    Number of Times Moved in the Last Year: 0    Homeless in the Last Year: No  Utilities: Not At Risk (10/30/2024)   Epic    Threatened with loss of utilities: No  Health Literacy: Adequate Health Literacy (10/30/2024)   B1300 Health Literacy    Frequency of need for help with medical instructions: Never     Review of Systems  Constitutional:  Negative for appetite change and unexpected weight change.  HENT:  Negative for congestion and sinus pressure.   Respiratory:  Negative for cough, chest tightness and shortness of breath.   Cardiovascular:  Negative for chest pain, palpitations and leg swelling.  Gastrointestinal:  Negative for abdominal pain, diarrhea, nausea and vomiting.  Genitourinary:  Negative for difficulty urinating and dysuria.  Musculoskeletal:        Bilateral hip tightness - intermittent.   Skin:  Negative for color change and rash.  Neurological:  Negative for dizziness and headaches.  Psychiatric/Behavioral:  Negative for agitation and dysphoric mood.        Objective:     BP 104/60   Pulse 84   Temp 98 F (36.7 C)   Ht 4' 9 (1.448 m)   Wt 117 lb 9.6 oz (53.3 kg)   SpO2 97%   BMI 25.45 kg/m  Wt Readings from Last 3 Encounters:  11/17/24 117 lb 9.6 oz (53.3 kg)  10/30/24 118 lb (53.5 kg)  07/18/24 116 lb 9.6 oz (52.9 kg)    Physical Exam Vitals reviewed.  Constitutional:      General: She is not in acute distress.    Appearance: Normal appearance.  HENT:     Head: Normocephalic and atraumatic.     Right Ear: External ear normal.     Left Ear: External ear normal.     Mouth/Throat:     Pharynx: No oropharyngeal exudate or posterior oropharyngeal erythema.  Eyes:     General: No scleral icterus.       Right eye: No discharge.        Left eye: No discharge.      Conjunctiva/sclera: Conjunctivae normal.  Neck:     Thyroid : No thyromegaly.  Cardiovascular:     Rate and Rhythm: Normal rate and regular rhythm.  Pulmonary:     Effort: No respiratory distress.     Breath sounds: Normal breath sounds. No wheezing.  Abdominal:     General: Bowel sounds are normal.     Palpations:  Abdomen is soft.     Tenderness: There is no abdominal tenderness.  Musculoskeletal:        General: No swelling or tenderness.     Cervical back: Neck supple. No tenderness.     Comments: No increased pain with abduction/adduction - lower extremities. No pain with rotation of her hips. Some increased discomfort - lateral hip with SLR.   Lymphadenopathy:     Cervical: No cervical adenopathy.  Skin:    Findings: No erythema or rash.  Neurological:     Mental Status: She is alert.  Psychiatric:        Mood and Affect: Mood normal.        Behavior: Behavior normal.         Outpatient Encounter Medications as of 11/17/2024  Medication Sig   aspirin  EC 81 MG tablet Take 1 tablet (81 mg total) by mouth daily.   budesonide-glycopyrrolate-formoterol (BREZTRI AEROSPHERE) 160-9-4.8 MCG/ACT AERO inhaler Inhale 2 puffs into the lungs 2 (two) times daily.   calcium  carbonate (OSCAL) 1500 (600 Ca) MG TABS tablet Take by mouth 2 (two) times daily with a meal.   cholecalciferol (VITAMIN D3) 25 MCG (1000 UNIT) tablet Take 1,000 Units by mouth 2 (two) times daily.   clobetasol  (TEMOVATE ) 0.05 % external solution Apply 1 mL to affected areas of skin twice daily   Clobetasol  Propionate 0.05 % shampoo Apply 4 mL of medication to affected areas of scalp. Let sit for 15 minutes. Then add water, lather, and rinse out   clopidogrel  (PLAVIX ) 75 MG tablet TAKE 1 TABLET BY MOUTH EVERY DAY   rosuvastatin  (CRESTOR ) 10 MG tablet Take 1 tablet (10 mg total) by mouth daily.   rosuvastatin  (CRESTOR ) 10 MG tablet TAKE 1 TABLET BY MOUTH EVERY DAY   sertraline  (ZOLOFT ) 50 MG tablet TAKE 1 TABLET BY  MOUTH EVERY DAY   [DISCONTINUED] Fluticasone -Umeclidin-Vilant (TRELEGY ELLIPTA) 100-62.5-25 MCG/ACT AEPB INHALE 1 PUFF INTO THE LUNGS ONCE DAILY (Patient not taking: Reported on 10/30/2024)   [DISCONTINUED] ketoconazole  (NIZORAL ) 2 % shampoo Apply to scalp as a shampoo 2-3 times weekly. Let sit for 5 minutes before rinsing. (Patient not taking: Reported on 10/30/2024)   [DISCONTINUED] nystatin  cream (MYCOSTATIN ) Apply 1 Application topically 2 (two) times daily. (Patient not taking: Reported on 10/30/2024)   [DISCONTINUED] rosuvastatin  (CRESTOR ) 10 MG tablet Take 1 tablet (10 mg total) by mouth daily.   No facility-administered encounter medications on file as of 11/17/2024.     Lab Results  Component Value Date   WBC 7.9 02/18/2024   HGB 12.9 02/18/2024   HCT 38.1 02/18/2024   PLT 249.0 02/18/2024   GLUCOSE 95 07/24/2024   CHOL 174 07/24/2024   TRIG 78.0 07/24/2024   HDL 109.00 07/24/2024   LDLCALC 49 07/24/2024   ALT 21 07/24/2024   AST 20 07/24/2024   NA 139 07/24/2024   K 4.4 07/24/2024   CL 104 07/24/2024   CREATININE 0.84 07/24/2024   BUN 16 07/24/2024   CO2 24 07/24/2024   TSH 1.04 02/18/2024   HGBA1C 6.0 07/24/2024    DG Bone Density Result Date: 11/01/2024 EXAM: DUAL X-RAY ABSORPTIOMETRY (DXA) FOR BONE MINERAL DENSITY 11/01/2024 11:32 am CLINICAL DATA:  73 year old Female Postmenopausal. Estrogen deficiency. History of fragility fracture. Patient is or has been on bone building therapies. TECHNIQUE: An axial (e.g., hips, spine) and/or appendicular (e.g., radius) exam was performed, as appropriate, using GE Secretary/administrator at [facility]. Images are obtained for bone mineral  density measurement and are not obtained for diagnostic purposes. [id number] Exclusions: None. COMPARISON:  07/23/2022. FINDINGS: Scan quality: Good. LUMBAR SPINE (L1-L4): BMD (in g/cm2): 0.908 T-score: -2.3 Z-score: -0.6 Rate of change from previous exam: No significant rate  of change from previous exam. LEFT FEMORAL NECK: BMD (in g/cm2): 0.693 T-score: -2.5 Z-score: -0.6 LEFT TOTAL HIP: BMD (in g/cm2): 0.709 T-score: -2.4 Z-score: -0.6 RIGHT FEMORAL NECK: BMD (in g/cm2): 0.709 T-score: -2.4 Z-score: -0.4 RIGHT TOTAL HIP: BMD (in g/cm2): 0.730 T-score: -2.2 Z-score: -0.5 DUAL-FEMUR TOTAL MEAN: Rate of change from previous exam: No significant rate of change from previous exam. FRAX 10-YEAR PROBABILITY OF FRACTURE: FRAX not reported as the lowest BMD is not in the osteopenia range. IMPRESSION: Osteoporosis based on BMD. Fracture risk is unknown due to history of bone building therapy. RECOMMENDATIONS: 1. All patients should optimize calcium  and vitamin D  intake. 2. Consider FDA-approved medical therapies in postmenopausal women and men aged 61 years and older, based on the following: - A hip or vertebral (clinical or morphometric) fracture - T-score less than or equal to -2.5 and secondary causes have been excluded. - Low bone mass (T-score between -1.0 and -2.5) and a 10-year probability of a hip fracture greater than or equal to 3% or a 10-year probability of a major osteoporosis-related fracture greater than or equal to 20% based on the [place] -adapted WHO algorithm. - Clinician judgment and/or patient preferences may indicate treatment for people with 10-year fracture probabilities above or below these levels 3. Patients with diagnosis of osteoporosis or at high risk for fracture should have regular bone mineral density tests. For patients eligible for Medicare, routine testing is allowed once every 2 years. The testing frequency can be increased to one year for patients who have rapidly progressing disease, those who are receiving or discontinuing medical therapy to restore bone mass, or have additional risk factors. Electronically Signed   By: Alm Parkins M.D.   On: 11/01/2024 14:01       Assessment & Plan:  Stress Assessment & Plan: Appears to be doing well. Continue zoloft .     Hyperlipidemia, unspecified hyperlipidemia type Assessment & Plan: Continue on crestor . Low cholesterol diet and exercise. Follow lipid panel.  Lab Results  Component Value Date   CHOL 174 07/24/2024   HDL 109.00 07/24/2024   LDLCALC 49 07/24/2024   TRIG 78.0 07/24/2024   CHOLHDL 2 07/24/2024     Orders: -     Basic metabolic panel with GFR; Future -     Hepatic function panel; Future -     Lipid panel; Future  Hyperglycemia Assessment & Plan: Low carb diet and exercise. Follow met b and A1c.   Orders: -     Hemoglobin A1c; Future  PAD (peripheral artery disease) Assessment & Plan:  Had f/u with [facility] - 04/25/24 - ABIs ok. Continue aspirin , plavix  and crestor . F/u one year.    Osteoporosis without current pathological fracture, unspecified osteoporosis type Assessment & Plan: Discussed bone density results today. Discussed osteoporosis and treatment options, including prescription options. Will notify me if desires to start. Continue calcium , vitamin D  and weight bearing exercise. Follow.    History of colonic polyps Assessment & Plan: Colonoscopy 07/2022 - recommended f/u in 3 years.    Chronic obstructive pulmonary disease, unspecified COPD type (HCC) Assessment & Plan: Seeing pulmonary. Has seen Dr Janelle. Last evaluated 09/11/24 - changed to Breztri. Breathing stable. No increased sob. No cough.    Adrenal nodule Assessment & Plan: Left  sided adrenal adenoma that was found in 4/20. It is likely benign based on imaging characteristics. Evaluated by endocrinology. Last - 05/01/2021: ACTH  after dex<1.5, Cortisol after dex=1.4, Dex=775. Metanephrines negative. Renin=0.977, aldosterone=10.8, ratio=11.1 . Will not need to f/u.    Bilateral hip pain Assessment & Plan: Hip tightness as outlined. Discussed exercise and stretches. Discussed PT. Wants to hold on referral at this time. Follow.    Other orders -     Rosuvastatin  Calcium ; Take 1 tablet (10 mg total)  by mouth daily.  Dispense: 90 tablet; Refill: 3     Allena Hamilton, MD

## 2024-11-18 ENCOUNTER — Encounter: Payer: Self-pay | Admitting: Internal Medicine

## 2024-11-18 DIAGNOSIS — M25551 Pain in right hip: Secondary | ICD-10-CM | POA: Insufficient documentation

## 2024-11-18 NOTE — Assessment & Plan Note (Addendum)
 Left sided adrenal adenoma that was found in 4/20. It is likely benign based on imaging characteristics. Evaluated by endocrinology. Last - 05/01/2021: ACTH  after dex<1.5, Cortisol after dex=1.4, Dex=775. Metanephrines negative. Renin=0.977, aldosterone=10.8, ratio=11.1 . Will not need to f/u.

## 2024-11-18 NOTE — Assessment & Plan Note (Signed)
 Colonoscopy 07/2022 - recommended f/u in 3 years.

## 2024-11-18 NOTE — Assessment & Plan Note (Signed)
 Had f/u with [facility] - 04/25/24 - ABIs ok. Continue aspirin , plavix  and crestor . F/u one year.

## 2024-11-18 NOTE — Assessment & Plan Note (Signed)
 Discussed bone density results today. Discussed osteoporosis and treatment options, including prescription options. Will notify me if desires to start. Continue calcium , vitamin D  and weight bearing exercise. Follow.

## 2024-11-18 NOTE — Assessment & Plan Note (Signed)
 Hip tightness as outlined. Discussed exercise and stretches. Discussed PT. Wants to hold on referral at this time. Follow.

## 2024-11-18 NOTE — Assessment & Plan Note (Signed)
Appears to be doing well. Continue zoloft.

## 2024-11-18 NOTE — Assessment & Plan Note (Signed)
 Continue on crestor . Low cholesterol diet and exercise. Follow lipid panel.  Lab Results  Component Value Date   CHOL 174 07/24/2024   HDL 109.00 07/24/2024   LDLCALC 49 07/24/2024   TRIG 78.0 07/24/2024   CHOLHDL 2 07/24/2024

## 2024-11-18 NOTE — Assessment & Plan Note (Signed)
 Low-carb diet and exercise.  Follow met b and A1c.

## 2024-11-18 NOTE — Assessment & Plan Note (Signed)
 Seeing pulmonary. Has seen Dr Janelle. Last evaluated 09/11/24 - changed to Breztri. Breathing stable. No increased sob. No cough.

## 2024-11-29 ENCOUNTER — Telehealth (INDEPENDENT_AMBULATORY_CARE_PROVIDER_SITE_OTHER): Payer: Self-pay

## 2024-11-29 ENCOUNTER — Ambulatory Visit: Payer: Self-pay

## 2024-11-29 NOTE — Telephone Encounter (Signed)
 Patient called at this time twice stating she had cramps in her R leg 3 days ago and now has some bruising on the calf of that leg at this time. Patient denies pain, tenderness, redness, and swelling at this time, only bruising in the calf area. I informed patient Dr. Marea is not in the office today which is who she asked to see and advised patient if the symptoms worsen or she has pain, tenderness, swelling, or redness to go to the emergency department to be checked out. I did advise patient to Ice and elevate as tolerated.  Also spoke with Gwendlyn, NP and he stated we have no ultrasound staff here at this time or providers so if it worsens to go to the ED.

## 2024-11-29 NOTE — Telephone Encounter (Signed)
 FYI Only or Action Required?: FYI only for provider: UC advised since no available appointments today. Unsure if pt will go.  Patient was last seen in primary care on 11/17/2024 by Glendia Shad, MD.  Called Nurse Triage reporting Bleeding/Bruising.  Symptoms began several days ago.  Interventions attempted: Nothing.  Symptoms are: unchanged.  Triage Disposition: See HCP Within 4 Hours (Or PCP Triage)  Patient/caregiver understands and will follow disposition?:   Reason for Disposition  [1] 5 or more bruises now AND [2] NOT caused by an injury  Answer Assessment - Initial Assessment Questions Pt states there was cramp in the right leg on 12/19 and was very sore to the touch and noticed 9-10 bruises 1-2 days later. Pt states right leg is not sore anymore but has some swelling. Pt on blood thinners.   Pt notices 1 large bruise on left leg. Pt unsure how the bruise started.  Writer advised UC since no available appts in the office. UC address was provided to the pt but unsure if pt will go.   1. APPEARANCE of BRUISE: Describe the bruise.      Blue/purple dots 2. SIZE: How large is the bruise?      Size of a dime on the calf 3. NUMBER: How many bruises are there?      9-10 bruises on the right leg. 1 large bruise on the left leg (silver dollar) 4. LOCATION: Where is the bruise located?      Left and right legs 5. ONSET: How long ago did the bruise occur?      Friday/Saturday 12/19-12/20 6. CAUSE: What do you think caused the bruise?     N/a 7. MEDICAL HISTORY: Do you have any medical problems that can cause easy bruising or bleeding? (e.g., leukemia, liver disease, recent chemotherapy)     Blood thinners 8. MEDICINES: Do you take any medicines which thin the blood such as: aspirin , apixaban, heparin , ibuprofen (NSAIDS), Plavix , or Coumadin?     Blood thinners 9. OTHER SYMPTOMS: Do you have any other symptoms?  (e.g., weakness, dizziness, pain, fever,  nosebleed, blood in urine/stool)     denies  Protocols used: Bruises-A-AH  Reason for Triage: Patient had one bad cramp the beginning of the week and now has bruises up and down the leg. She stated that the bruises came after the cramps.

## 2024-11-29 NOTE — Telephone Encounter (Signed)
Noted. °Sending to PCP for FYI °

## 2024-11-30 NOTE — Telephone Encounter (Signed)
 Reviewed. [facility]ascular did tell her to ice, elevate and to be evalutted if problems. Please call and confirm she is doing ok.

## 2024-12-01 NOTE — Telephone Encounter (Signed)
 Lvm for pt to return call. Okay to relay message

## 2024-12-01 NOTE — Telephone Encounter (Unsigned)
 Copied from CRM [id number]. Topic: Clinical - Pink Gardner Triage >> Nov 29, 2024 11:03 AM Drema MATSU wrote: Toni Gardner triggered transfer to Nurse Triage. See Triage Message for details. >> Dec 01, 2024  9:35 AM [name] wrote: Patient returned call made to her, adv patient of the following info: Looks like she contacted [facility] as well regarding this. They spoke with her and advised ice/elevation and to go to ED if worsening or develops pain, swelling, or redness. Agree with advice.    Patient had no further questions.  >> Nov 29, 2024 11:06 AM Drema MATSU wrote: Reason for Triage: Patient had one bad cramp the beginning of the week and now has bruises up and down the leg. She stated that the bruises came after the cramps.

## 2024-12-15 ENCOUNTER — Other Ambulatory Visit (INDEPENDENT_AMBULATORY_CARE_PROVIDER_SITE_OTHER)

## 2024-12-15 DIAGNOSIS — R739 Hyperglycemia, unspecified: Secondary | ICD-10-CM

## 2024-12-15 DIAGNOSIS — E785 Hyperlipidemia, unspecified: Secondary | ICD-10-CM | POA: Diagnosis not present

## 2024-12-15 LAB — BASIC METABOLIC PANEL WITH GFR
BUN: 17 mg/dL (ref 6–23)
CO2: 25 meq/L (ref 19–32)
Calcium: 9.4 mg/dL (ref 8.4–10.5)
Chloride: 106 meq/L (ref 96–112)
Creatinine, Ser: 0.83 mg/dL (ref 0.40–1.20)
GFR: 69.01 mL/min
Glucose, Bld: 83 mg/dL (ref 70–99)
Potassium: 4.4 meq/L (ref 3.5–5.1)
Sodium: 139 meq/L (ref 135–145)

## 2024-12-15 LAB — LIPID PANEL
Cholesterol: 196 mg/dL (ref 28–200)
HDL: 128.6 mg/dL
LDL Cholesterol: 49 mg/dL (ref 10–99)
NonHDL: 67.53
Total CHOL/HDL Ratio: 2
Triglycerides: 93 mg/dL (ref 10.0–149.0)
VLDL: 18.6 mg/dL (ref 0.0–40.0)

## 2024-12-15 LAB — HEPATIC FUNCTION PANEL
ALT: 20 U/L (ref 3–35)
AST: 18 U/L (ref 5–37)
Albumin: 4.3 g/dL (ref 3.5–5.2)
Alkaline Phosphatase: 82 U/L (ref 39–117)
Bilirubin, Direct: 0.1 mg/dL (ref 0.1–0.3)
Total Bilirubin: 0.4 mg/dL (ref 0.2–1.2)
Total Protein: 7 g/dL (ref 6.0–8.3)

## 2024-12-15 LAB — HEMOGLOBIN A1C: Hgb A1c MFr Bld: 5.8 % (ref 4.6–6.5)

## 2024-12-16 ENCOUNTER — Ambulatory Visit: Payer: Self-pay | Admitting: Internal Medicine

## 2025-01-18 ENCOUNTER — Ambulatory Visit: Payer: Medicare PPO | Admitting: Dermatology

## 2025-01-18 ENCOUNTER — Ambulatory Visit

## 2025-03-23 ENCOUNTER — Ambulatory Visit: Admitting: Internal Medicine

## 2025-04-24 ENCOUNTER — Encounter (INDEPENDENT_AMBULATORY_CARE_PROVIDER_SITE_OTHER)

## 2025-04-24 ENCOUNTER — Ambulatory Visit (INDEPENDENT_AMBULATORY_CARE_PROVIDER_SITE_OTHER): Admitting: Vascular Surgery

## 2025-11-05 ENCOUNTER — Ambulatory Visit
